# Patient Record
Sex: Male | Born: 1940 | Race: White | Hispanic: No | Marital: Married | Smoking: Former smoker
Health system: Southern US, Community
[De-identification: ages and names within clinical notes are randomized; demographics above are authoritative.]

## PROBLEM LIST (undated history)

## (undated) DIAGNOSIS — F909 Attention-deficit hyperactivity disorder, unspecified type: Secondary | ICD-10-CM

## (undated) DIAGNOSIS — C61 Malignant neoplasm of prostate: Secondary | ICD-10-CM

## (undated) DIAGNOSIS — G4730 Sleep apnea, unspecified: Secondary | ICD-10-CM

## (undated) DIAGNOSIS — F329 Major depressive disorder, single episode, unspecified: Secondary | ICD-10-CM

## (undated) DIAGNOSIS — F32A Depression, unspecified: Secondary | ICD-10-CM

## (undated) HISTORY — DX: Sleep apnea, unspecified: G47.30

## (undated) HISTORY — PX: THROAT SURGERY: SHX803

## (undated) HISTORY — PX: OTHER SURGICAL HISTORY: SHX169

## (undated) HISTORY — DX: Malignant neoplasm of prostate: C61

## (undated) HISTORY — DX: Attention-deficit hyperactivity disorder, unspecified type: F90.9

## (undated) HISTORY — PX: NASAL SEPTUM SURGERY: SHX37

---

## 2011-06-08 ENCOUNTER — Ambulatory Visit: Payer: Self-pay | Admitting: Internal Medicine

## 2011-09-29 ENCOUNTER — Ambulatory Visit: Payer: Self-pay

## 2013-05-23 ENCOUNTER — Ambulatory Visit: Payer: Self-pay | Admitting: Gastroenterology

## 2013-05-26 LAB — PATHOLOGY REPORT

## 2013-12-24 DIAGNOSIS — Z8546 Personal history of malignant neoplasm of prostate: Secondary | ICD-10-CM | POA: Insufficient documentation

## 2013-12-24 DIAGNOSIS — R5383 Other fatigue: Secondary | ICD-10-CM | POA: Insufficient documentation

## 2013-12-24 DIAGNOSIS — R001 Bradycardia, unspecified: Secondary | ICD-10-CM | POA: Insufficient documentation

## 2014-09-13 DIAGNOSIS — F339 Major depressive disorder, recurrent, unspecified: Secondary | ICD-10-CM | POA: Insufficient documentation

## 2014-09-13 DIAGNOSIS — F324 Major depressive disorder, single episode, in partial remission: Secondary | ICD-10-CM | POA: Insufficient documentation

## 2014-09-13 DIAGNOSIS — R030 Elevated blood-pressure reading, without diagnosis of hypertension: Secondary | ICD-10-CM | POA: Insufficient documentation

## 2015-06-01 ENCOUNTER — Emergency Department: Payer: Medicare Other

## 2015-06-01 ENCOUNTER — Emergency Department
Admission: EM | Admit: 2015-06-01 | Discharge: 2015-06-01 | Disposition: A | Discharge status: Home / Self Care | Payer: Medicare Other | Attending: Student | Admitting: Student

## 2015-06-01 ENCOUNTER — Encounter: Payer: Self-pay | Admitting: Emergency Medicine

## 2015-06-01 DIAGNOSIS — M545 Low back pain, unspecified: Secondary | ICD-10-CM

## 2015-06-01 DIAGNOSIS — M549 Dorsalgia, unspecified: Secondary | ICD-10-CM

## 2015-06-01 DIAGNOSIS — Z8659 Personal history of other mental and behavioral disorders: Secondary | ICD-10-CM | POA: Insufficient documentation

## 2015-06-01 DIAGNOSIS — Z8546 Personal history of malignant neoplasm of prostate: Secondary | ICD-10-CM | POA: Diagnosis not present

## 2015-06-01 HISTORY — DX: Depression, unspecified: F32.A

## 2015-06-01 HISTORY — DX: Major depressive disorder, single episode, unspecified: F32.9

## 2015-06-01 LAB — URINALYSIS COMPLETE WITH MICROSCOPIC (ARMC ONLY)
BACTERIA UA: NONE SEEN
BILIRUBIN URINE: NEGATIVE
GLUCOSE, UA: NEGATIVE mg/dL
HGB URINE DIPSTICK: NEGATIVE
Ketones, ur: NEGATIVE mg/dL
Leukocytes, UA: NEGATIVE
Nitrite: NEGATIVE
PH: 5 (ref 5.0–8.0)
Protein, ur: NEGATIVE mg/dL
Specific Gravity, Urine: 1.019 (ref 1.005–1.030)

## 2015-06-01 MED ORDER — TRAMADOL HCL 50 MG PO TABS: 50.0000 mg | ORAL_TABLET | Freq: Four times a day (QID) | ORAL | Status: DC | PRN

## 2015-06-01 MED ORDER — OXYCODONE HCL 5 MG PO TABS
ORAL_TABLET | ORAL | Status: AC
  Administered 2015-06-01: 10 mg via ORAL
  Filled 2015-06-01: qty 2

## 2015-06-01 MED ORDER — ONDANSETRON 4 MG PO TBDP
ORAL_TABLET | ORAL | Status: AC
  Administered 2015-06-01: 4 mg via ORAL
  Filled 2015-06-01: qty 1

## 2015-06-01 MED ORDER — MORPHINE SULFATE (PF) 4 MG/ML IV SOLN
INTRAVENOUS | Status: AC
  Administered 2015-06-01: 4 mg via INTRAMUSCULAR
  Filled 2015-06-01: qty 1

## 2015-06-01 MED ORDER — MORPHINE SULFATE (PF) 4 MG/ML IV SOLN
4.0000 mg | Freq: Once | INTRAVENOUS | Status: AC
Start: 2015-06-01 — End: 2015-06-01
  Administered 2015-06-01: 4 mg via INTRAMUSCULAR

## 2015-06-01 MED ORDER — ONDANSETRON 4 MG PO TBDP
4.0000 mg | ORAL_TABLET | Freq: Once | ORAL | Status: AC
  Administered 2015-06-01: 4 mg via ORAL

## 2015-06-01 MED ORDER — OXYCODONE HCL 5 MG PO TABS
10.0000 mg | ORAL_TABLET | Freq: Once | ORAL | Status: AC
  Administered 2015-06-01: 10 mg via ORAL

## 2015-06-01 NOTE — ED Notes (Signed)
AAOx3.  Skin warm and dry. Moving all extremities equally and strong.  NAD 

## 2015-06-01 NOTE — ED Notes (Signed)
Pt presents with low back pain after bending over today while working with a sander on a deck.

## 2015-06-01 NOTE — ED Provider Notes (Signed)
[facility] Emergency Department Provider Note  ____________________________________________  Time seen: Approximately 4:25 PM  I have reviewed the triage vital signs and the nursing notes.   HISTORY  Chief Complaint Back Pain    HPI [name] is a 74 y.o. male History of depression, no other chronic medical problems, history of treated prostate cancer in 2009 who presents for evaluation of gradual onset constant lumbar back pain today, worse on the right. Patient reports that he was bending over today working with a sander on a deck. He started noticing aching pain in the right back which spread to the remainder of his lower back. It's worse with movement. Current severity of pain is 8 out of 10. No other modifying factors. No chest pain or difficulty breathing, abdominal pain or recent illness. No fevers, chills, history of IV drug use, bowel or bladder incontinence, or weakness. His pain improved with use of an abdominal binder.   Past Medical History  Diagnosis Date  . Depression     There are no active problems to display for this patient.   No past surgical history on file.  Current Outpatient Rx  Name  Route  Sig  Dispense  Refill  . traMADol (ULTRAM) 50 MG tablet   Oral   Take 1 tablet (50 mg total) by mouth every 6 (six) hours as needed for moderate pain.   15 tablet   0     Allergies Review of patient's allergies indicates no known allergies.  No family history on file.  Social History Social History  Substance Use Topics  . Smoking status: Never Smoker   . Smokeless tobacco: None  . Alcohol Use: Yes    Review of Systems Constitutional: No fever/chills Eyes: No visual changes. ENT: No sore throat. Cardiovascular: Denies chest pain. Respiratory: Denies shortness of breath. Gastrointestinal: No abdominal pain.  No nausea, no vomiting.  No diarrhea.  No constipation. Genitourinary: Negative for dysuria. Musculoskeletal:  Positive for back pain. Skin: Negative for rash. Neurological: Negative for headaches, focal weakness or numbness.  10-point ROS otherwise negative.  ____________________________________________   PHYSICAL EXAM:  VITAL SIGNS: ED Triage Vitals  Enc Vitals Group     BP 06/01/15 1550 174/95 mmHg     Pulse Rate 06/01/15 1550 61     Resp 06/01/15 1550 18     Temp 06/01/15 1550 97.5 F (36.4 C)     Temp Source 06/01/15 1550 Oral     SpO2 06/01/15 1550 97 %     Weight 06/01/15 1547 180 lb (81.647 kg)     Height 06/01/15 1547 5\' 9"  (1.753 m)     Head Cir --      Peak Flow --      Pain Score 06/01/15 1550 8     Pain Loc --      Pain Edu? --      Excl. in [place]? --     Constitutional: Alert and oriented. Generally in no acute distress but he does develop pain whenever he sits forward or rotates his body at the torso. Eyes: Conjunctivae are normal. PERRL. EOMI. Head: Atraumatic. Nose: No congestion/rhinnorhea. Mouth/Throat: Mucous membranes are moist.  Oropharynx non-erythematous. Neck: No stridor.   Cardiovascular: Normal rate, regular rhythm. Grossly normal heart sounds.  Good peripheral circulation. Respiratory: Normal respiratory effort.  No retractions. Lungs CTAB. Gastrointestinal: Soft and nontender. No distention. No abdominal bruits. No CVA tenderness. Genitourinary: deferred Musculoskeletal: No lower extremity tenderness nor edema.  No joint  effusions. No midline tenderness to palpation throughout the T or L-spine. Mild point tenderness to palpation in the paravertebral muscles of the right lumbar spine.2+ DP pulses bilaterally. Neurologic:  Normal speech and language. No gross focal neurologic deficits are appreciated.5 out of 5 strength in bilateral lower extremities, sensation intact to light touch throughout, normal strength/dorsiflexion of bilateral big toes. Skin:  Skin is warm, dry and intact. No rash noted. Psychiatric: Mood and affect are normal. Speech and behavior  are normal.  ____________________________________________   LABS (all labs ordered are listed, but only abnormal results are displayed)  Labs Reviewed  URINALYSIS COMPLETEWITH MICROSCOPIC ([place]) - Abnormal; Notable for the following:    Color, Urine YELLOW (*)    APPearance CLEAR (*)    Squamous Epithelial / LPF 0-5 (*)    All other components within normal limits   ____________________________________________  EKG  none ____________________________________________  RADIOLOGY  US aorta  IMPRESSION: No evidence of abdominal aortic aneurysm.   Xray lumbar spine IMPRESSION: 1. 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 No malalignment or fracture. If pain persists despite conservative therapy, MRI may be warranted for further characterization. 2. Small sclerotic lesion in the L2 vertebral body has slightly irregular margins and probably represents a bone island. ____________________________________________   PROCEDURES  Procedure(s) performed: None  Critical Care performed: No  ____________________________________________   INITIAL IMPRESSION / ASSESSMENT AND PLAN / ED COURSE  Pertinent labs & imaging results that were available during my care of the patient were reviewed by me and considered in my medical decision making (see chart for details).  Zachary Hansen is a 74 y.o. male History of depression, no other chronic medical problems, history of treated prostate cancer in 2009 who presents for evaluation of gradual onset constant lumbar back pain today, worse on the right. Intact neurological examination.Suspect muscloskeletal back pain/strain. His pain is improved significantly at this point after 1 dose of intramuscular morphine. Plain films negative for any acute fracture. ultrasoundof the aorta negative for aneurysm. Doubt cauda equina, epidural abscess or ruptured AAA or acute dissection.discussed return precautions  and need for close PCP follow-up. He and family at bedside comfortable with the discharge plan. ____________________________________________   FINAL CLINICAL IMPRESSION(S) / ED DIAGNOSES  Final diagnoses:  Acute lumbar back pain      Joanne Gavel, MD 06/01/15 [phone]

## 2015-06-01 NOTE — ED Notes (Signed)
To x-ray via stretcher.

## 2015-11-18 ENCOUNTER — Ambulatory Visit: Payer: Self-pay | Admitting: Family Medicine

## 2015-12-06 ENCOUNTER — Other Ambulatory Visit: Payer: Self-pay | Admitting: Family Medicine

## 2015-12-06 ENCOUNTER — Ambulatory Visit (INDEPENDENT_AMBULATORY_CARE_PROVIDER_SITE_OTHER): Payer: Medicare Other | Admitting: Family Medicine

## 2015-12-06 ENCOUNTER — Encounter: Payer: Self-pay | Admitting: Family Medicine

## 2015-12-06 VITALS — BP 154/82 | HR 56 | Ht 67.0 in | Wt 184.0 lb

## 2015-12-06 DIAGNOSIS — N200 Calculus of kidney: Secondary | ICD-10-CM

## 2015-12-06 DIAGNOSIS — G4730 Sleep apnea, unspecified: Secondary | ICD-10-CM

## 2015-12-06 DIAGNOSIS — I10 Essential (primary) hypertension: Secondary | ICD-10-CM | POA: Diagnosis not present

## 2015-12-06 DIAGNOSIS — C61 Malignant neoplasm of prostate: Secondary | ICD-10-CM | POA: Diagnosis not present

## 2015-12-06 DIAGNOSIS — R001 Bradycardia, unspecified: Secondary | ICD-10-CM | POA: Diagnosis not present

## 2015-12-06 DIAGNOSIS — R011 Cardiac murmur, unspecified: Secondary | ICD-10-CM | POA: Insufficient documentation

## 2015-12-06 DIAGNOSIS — I129 Hypertensive chronic kidney disease with stage 1 through stage 4 chronic kidney disease, or unspecified chronic kidney disease: Secondary | ICD-10-CM | POA: Insufficient documentation

## 2015-12-06 DIAGNOSIS — M5441 Lumbago with sciatica, right side: Secondary | ICD-10-CM | POA: Diagnosis not present

## 2015-12-06 MED ORDER — NAPROXEN 500 MG PO TABS: 500.0000 mg | ORAL_TABLET | Freq: Two times a day (BID) | ORAL | Status: DC

## 2015-12-06 MED ORDER — CYCLOBENZAPRINE HCL 10 MG PO TABS: 10.0000 mg | ORAL_TABLET | Freq: Every day | ORAL | Status: DC

## 2015-12-06 NOTE — Patient Instructions (Addendum)
Back Exercises The following exercises strengthen the muscles that help to support the back. They also help to keep the lower back flexible. Doing these exercises can help to prevent back pain or lessen existing pain. If you have back pain or discomfort, try doing these exercises 2-3 times each day or as told by your health care provider. When the pain goes away, do them once each day, but increase the number of times that you repeat the steps for each exercise (do more repetitions). If you do not have back pain or discomfort, do these exercises once each day or as told by your health care provider. EXERCISES Single Knee to Chest Repeat these steps 3-5 times for each leg:  Lie on your back on a firm bed or the floor with your legs extended.  Bring one knee to your chest. Your other leg should stay extended and in contact with the floor.  Hold your knee in place by grabbing your knee or thigh.  Pull on your knee until you feel a gentle stretch in your lower back.  Hold the stretch for 10-30 seconds.  Slowly release and straighten your leg. Pelvic Tilt Repeat these steps 5-10 times: 1. Lie on your back on a firm bed or the floor with your legs extended. 2. Bend your knees so they are pointing toward the ceiling and your feet are flat on the floor. 3. Tighten your lower abdominal muscles to press your lower back against the floor. This motion will tilt your pelvis so your tailbone points up toward the ceiling instead of pointing to your feet or the floor. 4. With gentle tension and even breathing, hold this position for 5-10 seconds. Cat-Cow Repeat these steps until your lower back becomes more flexible: 1. Get into a hands-and-knees position on a firm surface. Keep your hands under your shoulders, and keep your knees under your hips. You may place padding under your knees for comfort. 2. Let your head hang down, and point your tailbone toward the floor so your lower back becomes rounded like  the back of a cat. 3. Hold this position for 5 seconds. 4. Slowly lift your head and point your tailbone up toward the ceiling so your back forms a sagging arch like the back of a cow. 5. Hold this position for 5 seconds. Press-Ups Repeat these steps 5-10 times: 1. Lie on your abdomen (face-down) on the floor. 2. Place your palms near your head, about shoulder-width apart. 3. While you keep your back as relaxed as possible and keep your hips on the floor, slowly straighten your arms to raise the top half of your body and lift your shoulders. Do not use your back muscles to raise your upper torso. You may adjust the placement of your hands to make yourself more comfortable. 4. Hold this position for 5 seconds while you keep your back relaxed. 5. Slowly return to lying flat on the floor. Bridges Repeat these steps 10 times: 1. Lie on your back on a firm surface. 2. Bend your knees so they are pointing toward the ceiling and your feet are flat on the floor. 3. Tighten your buttocks muscles and lift your buttocks off of the floor until your waist is at almost the same height as your knees. You should feel the muscles working in your buttocks and the back of your thighs. If you do not feel these muscles, slide your feet 1-2 inches farther away from your buttocks. 4. Hold this position for 3-5  seconds. 5. Slowly lower your hips to the starting position, and allow your buttocks muscles to relax completely. If this exercise is too easy, try doing it with your arms crossed over your chest. Abdominal Crunches Repeat these steps 5-10 times: 1. Lie on your back on a firm bed or the floor with your legs extended. 2. Bend your knees so they are pointing toward the ceiling and your feet are flat on the floor. 3. Cross your arms over your chest. 4. Tip your chin slightly toward your chest without bending your neck. 5. Tighten your abdominal muscles and slowly raise your trunk (torso) high enough to lift  your shoulder blades a tiny bit off of the floor. Avoid raising your torso higher than that, because it can put too much stress on your low back and it does not help to strengthen your abdominal muscles. 6. Slowly return to your starting position. Back Lifts Repeat these steps 5-10 times: 1. Lie on your abdomen (face-down) with your arms at your sides, and rest your forehead on the floor. 2. Tighten the muscles in your legs and your buttocks. 3. Slowly lift your chest off of the floor while you keep your hips pressed to the floor. Keep the back of your head in line with the curve in your back. Your eyes should be looking at the floor. 4. Hold this position for 3-5 seconds. 5. Slowly return to your starting position. SEEK MEDICAL CARE IF:  Your back pain or discomfort gets much worse when you do an exercise.  Your back pain or discomfort does not lessen within 2 hours after you exercise. If you have any of these problems, stop doing these exercises right away. Do not do them again unless your health care provider says that you can. SEEK IMMEDIATE MEDICAL CARE IF:  You develop sudden, severe back pain. If this happens, stop doing the exercises right away. Do not do them again unless your health care provider says that you can.   This information is not intended to replace advice given to you by your health care provider. Make sure you discuss any questions you have with your health care provider.   Document Released: 10/26/2004 Document Revised: 06/09/2015 Document Reviewed: 11/12/2014 Elsevier Interactive Patient Education 2016 [place] DASH stands for "Dietary Approaches to Stop Hypertension." The DASH eating plan is a healthy eating plan that has been shown to reduce high blood pressure (hypertension). Additional health benefits may include reducing the risk of type 2 diabetes mellitus, heart disease, and stroke. The DASH eating plan may also help with weight  loss. WHAT DO I NEED TO KNOW ABOUT THE DASH EATING PLAN? For the DASH eating plan, you will follow these general guidelines:  Choose foods with a percent daily value for sodium of less than 5% (as listed on the food label).  Use salt-free seasonings or herbs instead of table salt or sea salt.  Check with your health care provider or pharmacist before using salt substitutes.  Eat lower-sodium products, often labeled as "lower sodium" or "no salt added."  Eat fresh foods.  Eat more vegetables, fruits, and low-fat dairy products.  Choose whole grains. Look for the word "whole" as the first word in the ingredient list.  Choose fish and skinless chicken or [place] more often than red meat. Limit fish, poultry, and meat to 6 oz (170 g) each day.  Limit sweets, desserts, sugars, and sugary drinks.  Choose heart-healthy fats.  Limit cheese to  1 oz (28 g) per day.  Eat more home-cooked food and less restaurant, buffet, and fast food.  Limit fried foods.  Cook foods using methods other than frying.  Limit canned vegetables. If you do use them, rinse them well to decrease the sodium.  When eating at a restaurant, ask that your food be prepared with less salt, or no salt if possible. WHAT FOODS CAN I EAT? Seek help from a dietitian for individual calorie needs. Grains Whole grain or whole wheat bread. Brown rice. Whole grain or whole wheat pasta. Quinoa, bulgur, and whole grain cereals. Low-sodium cereals. Corn or whole wheat flour tortillas. Whole grain cornbread. Whole grain crackers. Low-sodium crackers. Vegetables Fresh or frozen vegetables (raw, steamed, roasted, or grilled). Low-sodium or reduced-sodium tomato and vegetable juices. Low-sodium or reduced-sodium tomato sauce and paste. Low-sodium or reduced-sodium canned vegetables.  Fruits All fresh, canned (in natural juice), or frozen fruits. Meat and Other Protein Products Ground beef (85% or leaner), grass-fed beef, or beef  trimmed of fat. Skinless chicken or [place]. Ground chicken or [place]. Pork trimmed of fat. All fish and seafood. Eggs. Dried beans, peas, or lentils. Unsalted nuts and seeds. Unsalted canned beans. Dairy Low-fat dairy products, such as skim or 1% milk, 2% or reduced-fat cheeses, low-fat ricotta or cottage cheese, or plain low-fat yogurt. Low-sodium or reduced-sodium cheeses. Fats and Oils Tub margarines without trans fats. Light or reduced-fat mayonnaise and salad dressings (reduced sodium). Avocado. Safflower, olive, or canola oils. Natural peanut or almond butter. Other Unsalted popcorn and pretzels. The items listed above may not be a complete list of recommended foods or beverages. Contact your dietitian for more options. WHAT FOODS ARE NOT RECOMMENDED? Grains White bread. White pasta. White rice. Refined cornbread. Bagels and croissants. Crackers that contain trans fat. Vegetables Creamed or fried vegetables. Vegetables in a cheese sauce. Regular canned vegetables. Regular canned tomato sauce and paste. Regular tomato and vegetable juices. Fruits Dried fruits. Canned fruit in light or heavy syrup. Fruit juice. Meat and Other Protein Products Fatty cuts of meat. Ribs, chicken wings, bacon, sausage, bologna, salami, chitterlings, fatback, hot dogs, bratwurst, and packaged luncheon meats. Salted nuts and seeds. Canned beans with salt. Dairy Whole or 2% milk, cream, half-and-half, and cream cheese. Whole-fat or sweetened yogurt. Full-fat cheeses or blue cheese. Nondairy creamers and whipped toppings. Processed cheese, cheese spreads, or cheese curds. Condiments Onion and garlic salt, seasoned salt, table salt, and sea salt. Canned and packaged gravies. Worcestershire sauce. Tartar sauce. Barbecue sauce. Teriyaki sauce. Soy sauce, including reduced sodium. Steak sauce. Fish sauce. Oyster sauce. Cocktail sauce. Horseradish. Ketchup and mustard. Meat flavorings and tenderizers. Bouillon cubes. Hot  sauce. Tabasco sauce. Marinades. Taco seasonings. Relishes. Fats and Oils Butter, stick margarine, lard, shortening, ghee, and bacon fat. Coconut, palm kernel, or palm oils. Regular salad dressings. Other Pickles and olives. Salted popcorn and pretzels. The items listed above may not be a complete list of foods and beverages to avoid. Contact your dietitian for more information. WHERE CAN I FIND MORE INFORMATION? National Heart, Lung, and Blood Institute: travelstabloid.com   This information is not intended to replace advice given to you by your health care provider. Make sure you discuss any questions you have with your health care provider.   Document Released: 09/07/2011 Document Revised: 10/09/2014 Document Reviewed: 07/23/2013 Elsevier Interactive Patient Education [employer].

## 2015-12-06 NOTE — Progress Notes (Signed)
BP 154/82 mmHg  Pulse 56  Ht 5\' 7"  (1.702 m)  Wt 184 lb (83.462 kg)  BMI 28.81 kg/m2  SpO2 97%   Subjective:    Patient ID: Zachary Hansen, male    DOB: 11/26/1940, 76 y.o.   MRN: [id number]  HPI: Zachary Hansen is a 75 y.o. male who presents today to establish care. Was going to [place]- last saw them about a year ago.  Chief Complaint  Patient presents with  . Back Pain    right side across back and now in left hip  . Hypertension   HYPERTENSION- has never had BP this high before. Has been increasing gradually little by little Hypertension status: uncontrolled  Satisfied with current treatment? 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  Had prostate cancer in July 2009- TURP. Hasn't seen urologist in 2 years.  Had sleep apnea. Uses CPAP, sleeps well with it.   BACK PAIN Duration: Started October or November, hurt when bending down, then got worse with standing up, felt a zing in his back and hasn't felt well since then. Went to the ER and was given tramadol and it got better, but hasn't gone away entirely.  Mechanism of injury: lifting Location: L>R and low back Onset: sudden Severity: severe when it first started, now 2-3/10 Quality: dull Frequency: waxes and wanes Radiation: L leg above the knee Aggravating factors: lifting, movement, walking, bending and prolonged sitting Alleviating factors: TENS unit, rest and heat Status: better Treatments  attempted: TENs unit, rest, heat and ibuprofen  Relief with NSAIDs?: mild Nighttime pain:  no Paresthesias / decreased sensation:  no Bowel / bladder incontinence:  no Fevers:  no Dysuria / urinary frequency:  no  Active Ambulatory Problems    Diagnosis Date Noted  . 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 Fatigue 12/24/2013  . Malignant neoplasm of prostate ([place]) 12/24/2013  . Sleep apnea 12/06/2015  . Heart murmur 12/06/2015  . Kidney stone 12/06/2015  . Essential hypertension 12/06/2015  . Right-sided low back pain with right-sided sciatica 12/06/2015   Resolved Ambulatory Problems    Diagnosis Date Noted  . Blood pressure elevated without history of HTN 09/13/2014   Past Medical History  Diagnosis Date  . Depression   . Prostate cancer ([place])   . ADHD (attention deficit hyperactivity disorder)    Past Surgical History  Procedure Laterality Date  . Prostate cancer surgery    . Throat surgery      Due to snoring  . Nasal septum surgery     No Known Allergies  Family History  Problem Relation Age of Onset  . Arthritis Mother   . Heart disease Father   . Cancer Father   . Heart disease Sister   . Cancer Sister   . Sleep apnea Brother   . Alzheimer's disease Maternal Grandfather   . Heart disease Paternal Grandmother   . Diverticulitis Sister   . Sleep apnea Sister    Social History   Social History  .  Marital Status: Married    Spouse Name: N/A  . Number of Children: N/A  . Years of Education: N/A   Social History Main Topics  . Smoking status: Never Smoker   . Smokeless tobacco: Never Used  . Alcohol Use: 13.8 oz/week    21 Cans of beer, 2 Shots of liquor per week  . Drug Use: No  . Sexual Activity: No   Other Topics Concern  . None   Social History Narrative    Review of Systems  Constitutional: Negative.   Respiratory: Negative.   Cardiovascular: Negative.   Musculoskeletal: Positive for myalgias and back pain.  Negative for joint swelling, arthralgias, gait problem, neck pain and neck stiffness.  Psychiatric/Behavioral: Negative.     Per HPI unless specifically indicated above     Objective:    BP 154/82 mmHg  Pulse 56  Ht 5\' 7"  (1.702 m)  Wt 184 lb (83.462 kg)  BMI 28.81 kg/m2  SpO2 97%  Wt Readings from Last 3 Encounters:  12/06/15 184 lb (83.462 kg)  06/01/15 180 lb (81.647 kg)    Physical Exam  Constitutional: He is oriented to person, place, and time. He appears well-developed and well-nourished. No distress.  HENT:  Head: Normocephalic and atraumatic.  Right Ear: Hearing normal.  Left Ear: Hearing normal.  Nose: Nose normal.  Eyes: Conjunctivae and lids are normal. Right eye exhibits no discharge. Left eye exhibits no discharge. No scleral icterus.  Cardiovascular: Regular rhythm and intact distal pulses.  Bradycardia present.  Exam reveals no gallop and no friction rub.   Murmur heard. Pulmonary/Chest: Effort normal and breath sounds normal. No respiratory distress. He has no wheezes. He has no rales. He exhibits no tenderness.  Neurological: He is alert and oriented to person, place, and time.  Skin: Skin is warm, dry and intact. No rash noted. No erythema. No pallor.  Psychiatric: He has a normal mood and affect. His speech is normal and behavior is normal. Judgment and thought content normal. Cognition and memory are normal.  Nursing note and vitals reviewed. Back Exam:    Inspection:  Normal spinal curvature.  No deformity, ecchymosis, erythema, or lesions     Palpation:     Midline spinal tenderness: no      Paralumbar tenderness: yes Left     Parathoracic tenderness: no      Buttocks tenderness: no     Range of Motion:      Flexion: Fingers to Knees     Extension:Decreased     Lateral bending:Decreased    Rotation:Decreased    Neuro Exam:Lower extremity DTRs normal & symmetric.  Strength and sensation intact.    Special Tests:      Straight leg  raise:negative   Results for orders placed or performed during the hospital encounter of 06/01/15  Urinalysis complete, with microscopic ([facility] only)  Result Value Ref Range   Color, Urine YELLOW (A) YELLOW   APPearance CLEAR (A) CLEAR   Glucose, UA NEGATIVE NEGATIVE mg/dL   Bilirubin Urine NEGATIVE NEGATIVE   Ketones, ur NEGATIVE NEGATIVE mg/dL   Specific Gravity, Urine 1.019 1.005 - 1.030   Hgb urine dipstick NEGATIVE NEGATIVE   pH 5.0 5.0 - 8.0   Protein, ur NEGATIVE NEGATIVE mg/dL   Nitrite NEGATIVE NEGATIVE   Leukocytes, UA NEGATIVE NEGATIVE   RBC / HPF 0-5 0 - 5 RBC/hpf   WBC, UA 0-5 0 - 5 WBC/hpf   Bacteria, UA NONE SEEN NONE SEEN   Squamous Epithelial /  LPF 0-5 (A) NONE SEEN   Mucous PRESENT       Assessment & Plan:   Problem List Items Addressed This Visit      Cardiovascular and Mediastinum   Essential hypertension    Will give him 2 weeks to try DASH diet. If not better, consider lisinopril next visit. Better on recheck today.        Genitourinary   Malignant neoplasm of prostate [facility])    Has not seen Urology in about 2 years, will need referral over there, but will hold off on that for now. Continue to monitor. Will need PSA at his physical.      Relevant Medications   naproxen (NAPROSYN) 500 MG tablet     Other   Bradycardia    Will need follow up with cardiology eventually as it's been 2 years since he's seen them. Hold off right now, consider next visit. No beta-blockers.       Sleep apnea    Continue CPAP. Continue to monitor. Call with any concerns.       Right-sided low back pain with right-sided sciatica - Primary    Will treat with flexeril and naproxen. Exercises given today. Follow up in 2 weeks, if not significantly better, consider PT.       Relevant Medications   cyclobenzaprine (FLEXERIL) 10 MG tablet   naproxen (NAPROSYN) 500 MG tablet       Follow up plan: Return in about 2 weeks (around 12/20/2015) for Follow up back pain and  blood pressure.

## 2015-12-06 NOTE — Assessment & Plan Note (Signed)
Will need follow up with cardiology eventually as it's been 2 years since he's seen them. Hold off right now, consider next visit. No beta-blockers.

## 2015-12-06 NOTE — Assessment & Plan Note (Signed)
Will treat with flexeril and naproxen. Exercises given today. Follow up in 2 weeks, if not significantly better, consider PT.

## 2015-12-06 NOTE — Assessment & Plan Note (Signed)
Will give him 2 weeks to try DASH diet. If not better, consider lisinopril next visit. Better on recheck today.

## 2015-12-06 NOTE — Assessment & Plan Note (Signed)
Has not seen Urology in about 2 years, will need referral over there, but will hold off on that for now. Continue to monitor. Will need PSA at his physical.

## 2015-12-06 NOTE — Assessment & Plan Note (Signed)
Continue CPAP. Continue to monitor. Call with any concerns.

## 2015-12-20 ENCOUNTER — Ambulatory Visit (INDEPENDENT_AMBULATORY_CARE_PROVIDER_SITE_OTHER): Payer: Medicare Other | Admitting: Family Medicine

## 2015-12-20 ENCOUNTER — Encounter: Payer: Self-pay | Admitting: Family Medicine

## 2015-12-20 VITALS — BP 164/84 | HR 80 | Temp 98.9°F | Ht 67.0 in | Wt 190.0 lb

## 2015-12-20 DIAGNOSIS — F329 Major depressive disorder, single episode, unspecified: Secondary | ICD-10-CM

## 2015-12-20 DIAGNOSIS — M5441 Lumbago with sciatica, right side: Secondary | ICD-10-CM | POA: Diagnosis not present

## 2015-12-20 DIAGNOSIS — I10 Essential (primary) hypertension: Secondary | ICD-10-CM

## 2015-12-20 DIAGNOSIS — F32A Depression, unspecified: Secondary | ICD-10-CM

## 2015-12-20 MED ORDER — FLUOXETINE HCL 20 MG PO CAPS: 20.0000 mg | ORAL_CAPSULE | Freq: Every day | ORAL | Status: DC

## 2015-12-20 MED ORDER — NAPROXEN 500 MG PO TABS: 500.0000 mg | ORAL_TABLET | Freq: Two times a day (BID) | ORAL | Status: DC

## 2015-12-20 MED ORDER — CYCLOBENZAPRINE HCL 10 MG PO TABS: 10.0000 mg | ORAL_TABLET | Freq: Every day | ORAL | Status: DC

## 2015-12-20 MED ORDER — LISINOPRIL 10 MG PO TABS: 10.0000 mg | ORAL_TABLET | Freq: Every day | ORAL | Status: DC

## 2015-12-20 NOTE — Progress Notes (Signed)
BP 164/84 mmHg  Pulse 80  Temp(Src) 98.9 F (37.2 C)  Ht 5\' 7"  (1.702 m)  Wt 190 lb (86.183 kg)  BMI 29.75 kg/m2  SpO2 97%  Subjective:    Patient ID: Zachary Hansen, male    DOB: 1941/09/14, 75 y.o.   MRN: [id number]  HPI: Zachary Hansen is a 75 y.o. male  Chief Complaint  Patient presents with  . Depression    Patient needs a refill on Prozac  . Back Pain  . Hypertension   HYPERTENSION Hypertension status: uncontrolled  Satisfied with current treatment? no Duration of hypertension: unknown BP monitoring frequency:  not checking Aspirin: no Recurrent headaches: no Visual changes: no Palpitations: no Dyspnea: no Chest pain: no Lower extremity edema: no Dizzy/lightheaded: no   BACK PAIN- doing much better! Feeling almost like himself, but noticing that he feels numb in his L leg and has been having some atrophy Duration: Started October or November, hurt when bending down, then got worse with standing up, felt a zing in his back and hasn't felt well since then. Went to the ER and was given tramadol and it got better, but hasn't gone away entirely. Much better after new meds from 2 weeks ago Mechanism of injury: lifting Location: L>R and low back Onset: sudden Severity: severe when it first started, now 2-3/10 Quality: dull Frequency: waxes and wanes Radiation: L leg above the knee Aggravating factors: lifting, movement, walking, bending and prolonged sitting Alleviating factors: TENS unit, rest and heat Status: better Treatments attempted: TENs unit, rest, heat and ibuprofen  Relief with NSAIDs?: mild Nighttime pain: no Paresthesias / decreased sensation: no Bowel / bladder incontinence: no Fevers: no  DEPRESSION Mood status: controlled Satisfied with current treatment?: yes Symptom severity: mild  Duration of current treatment : chronic Side effects: no Medication compliance: excellent compliance Psychotherapy/counseling: no  Depressed mood:  yes Anxious mood: no Anhedonia: no Significant weight loss or gain: no Insomnia: no  Fatigue: no Feelings of worthlessness or guilt: no Impaired concentration/indecisiveness: no Suicidal ideations: no Hopelessness: no Crying spells: no Depression screen [facility] 2/9 12/20/2015 12/06/2015  Decreased Interest 1 1  Down, Depressed, Hopeless 1 1  PHQ - 2 Score 2 2  Altered sleeping - 0  Tired, decreased energy - 3  Change in appetite - 0  Feeling bad or failure about yourself  - 0  Trouble concentrating - 0  Moving slowly or fidgety/restless - 0  Suicidal thoughts - 0  PHQ-9 Score - 5  Difficult doing work/chores - Somewhat difficult   Relevant past medical, surgical, family and social history reviewed and updated as indicated. Interim medical history since our last visit reviewed. Allergies and medications reviewed and updated.  Review of Systems  Respiratory: Negative.   Cardiovascular: Negative for chest pain.  Musculoskeletal: Positive for back pain. Negative for myalgias, joint swelling, arthralgias, gait problem, neck pain and neck stiffness.  Neurological: Positive for weakness and numbness. Negative for dizziness, tremors, seizures, syncope, facial asymmetry, speech difficulty, light-headedness and headaches.  Psychiatric/Behavioral: Negative.     Per HPI unless specifically indicated above     Objective:    BP 164/84 mmHg  Pulse 80  Temp(Src) 98.9 F (37.2 C)  Ht 5\' 7"  (1.702 m)  Wt 190 lb (86.183 kg)  BMI 29.75 kg/m2  SpO2 97%  Wt Readings from Last 3 Encounters:  12/20/15 190 lb (86.183 kg)  12/06/15 184 lb (83.462 kg)  06/01/15 180 lb (81.647 kg)    Physical Exam  Constitutional: He is oriented to person, place, and time. He appears well-developed and well-nourished. No distress.  HENT:  Head: Normocephalic and atraumatic.  Right Ear: Hearing normal.  Left Ear: Hearing normal.  Nose: Nose normal.  Eyes: Conjunctivae and lids are normal. Right eye exhibits  no discharge. Left eye exhibits no discharge. No scleral icterus.  Cardiovascular: Normal rate, regular rhythm, normal heart sounds and intact distal pulses.  Exam reveals no gallop and no friction rub.   No murmur heard. Pulmonary/Chest: Effort normal and breath sounds normal. No respiratory distress. He has no wheezes. He has no rales. He exhibits no tenderness.  Musculoskeletal: He exhibits tenderness.  Decreased ROM to lumbar. + tenderness to paraspinal muscles, atrophy L thigh  Neurological: He is alert and oriented to person, place, and time. He has normal reflexes. He displays normal reflexes. No cranial nerve deficit. He exhibits normal muscle tone. Coordination normal.  Decreased sensation L thigh, otherwise in tact  Skin: Skin is warm, dry and intact. No rash noted. No erythema. No pallor.  Psychiatric: He has a normal mood and affect. His speech is normal and behavior is normal. Judgment and thought content normal. Cognition and memory are normal.  Nursing note and vitals reviewed.   Results for orders placed or performed during the hospital encounter of 06/01/15  Urinalysis complete, with microscopic ([facility] only)  Result Value Ref Range   Color, Urine YELLOW (A) YELLOW   APPearance CLEAR (A) CLEAR   Glucose, UA NEGATIVE NEGATIVE mg/dL   Bilirubin Urine NEGATIVE NEGATIVE   Ketones, ur NEGATIVE NEGATIVE mg/dL   Specific Gravity, Urine 1.019 1.005 - 1.030   Hgb urine dipstick NEGATIVE NEGATIVE   pH 5.0 5.0 - 8.0   Protein, ur NEGATIVE NEGATIVE mg/dL   Nitrite NEGATIVE NEGATIVE   Leukocytes, UA NEGATIVE NEGATIVE   RBC / HPF 0-5 0 - 5 RBC/hpf   WBC, UA 0-5 0 - 5 WBC/hpf   Bacteria, UA NONE SEEN NONE SEEN   Squamous Epithelial / LPF 0-5 (A) NONE SEEN   Mucous PRESENT       Assessment & Plan:   Problem List Items Addressed This Visit      Cardiovascular and Mediastinum   Essential hypertension - Primary    Will start on lisinopril. Risks and benefits discussed. Call if  getting dizzy. Recheck 1 month.      Relevant Medications   lisinopril (PRINIVIL,ZESTRIL) 10 MG tablet     Other   Clinical depression    Well controlled on current regimen. Continue current regimen. Continue to monitor. Call with any problems.       Relevant Medications   FLUoxetine (PROZAC) 20 MG capsule   Right-sided low back pain with right-sided sciatica    Improving, but now with parethesias and atrophy- referral to PT made today. Recheck 4 weeks. Continue current meds.       Relevant Medications   naproxen (NAPROSYN) 500 MG tablet   cyclobenzaprine (FLEXERIL) 10 MG tablet       Follow up plan: Return in about 4 weeks (around 01/17/2016) for BP.

## 2015-12-20 NOTE — Assessment & Plan Note (Signed)
Improving, but now with parethesias and atrophy- referral to PT made today. Recheck 4 weeks. Continue current meds.

## 2015-12-20 NOTE — Assessment & Plan Note (Signed)
Well controlled on current regimen. Continue current regimen. Continue to monitor. Call with any problems.

## 2015-12-20 NOTE — Assessment & Plan Note (Signed)
Will start on lisinopril. Risks and benefits discussed. Call if getting dizzy. Recheck 1 month.

## 2015-12-20 NOTE — Patient Instructions (Addendum)
DASH Eating Plan DASH stands for "Dietary Approaches to Stop Hypertension." The DASH eating plan is a healthy eating plan that has been shown to reduce high blood pressure (hypertension). Additional health benefits may include reducing the risk of type 2 diabetes mellitus, heart disease, and stroke. The DASH eating plan may also help with weight loss. WHAT DO I NEED TO KNOW ABOUT THE DASH EATING PLAN? For the DASH eating plan, you will follow these general guidelines:  Choose foods with a percent daily value for sodium of less than 5% (as listed on the food label).  Use salt-free seasonings or herbs instead of table salt or sea salt.  Check with your health care provider or pharmacist before using salt substitutes.  Eat lower-sodium products, often labeled as "lower sodium" or "no salt added."  Eat fresh foods.  Eat more vegetables, fruits, and low-fat dairy products.  Choose whole grains. Look for the word "whole" as the first word in the ingredient list.  Choose fish and skinless chicken or turkey more often than red meat. Limit fish, poultry, and meat to 6 oz (170 g) each day.  Limit sweets, desserts, sugars, and sugary drinks.  Choose heart-healthy fats.  Limit cheese to 1 oz (28 g) per day.  Eat more home-cooked food and less restaurant, buffet, and fast food.  Limit fried foods.  Cook foods using methods other than frying.  Limit canned vegetables. If you do use them, rinse them well to decrease the sodium.  When eating at a restaurant, ask that your food be prepared with less salt, or no salt if possible. WHAT FOODS CAN I EAT? Seek help from a dietitian for individual calorie needs. Grains Whole grain or whole wheat bread. Brown rice. Whole grain or whole wheat pasta. Quinoa, bulgur, and whole grain cereals. Low-sodium cereals. Corn or whole wheat flour tortillas. Whole grain cornbread. Whole grain crackers. Low-sodium crackers. Vegetables Fresh or frozen vegetables  (raw, steamed, roasted, or grilled). Low-sodium or reduced-sodium tomato and vegetable juices. Low-sodium or reduced-sodium tomato sauce and paste. Low-sodium or reduced-sodium canned vegetables.  Fruits All fresh, canned (in natural juice), or frozen fruits. Meat and Other Protein Products Ground beef (85% or leaner), grass-fed beef, or beef trimmed of fat. Skinless chicken or turkey. Ground chicken or turkey. Pork trimmed of fat. All fish and seafood. Eggs. Dried beans, peas, or lentils. Unsalted nuts and seeds. Unsalted canned beans. Dairy Low-fat dairy products, such as skim or 1% milk, 2% or reduced-fat cheeses, low-fat ricotta or cottage cheese, or plain low-fat yogurt. Low-sodium or reduced-sodium cheeses. Fats and Oils Tub margarines without trans fats. Light or reduced-fat mayonnaise and salad dressings (reduced sodium). Avocado. Safflower, olive, or canola oils. Natural peanut or almond butter. Other Unsalted popcorn and pretzels. The items listed above may not be a complete list of recommended foods or beverages. Contact your dietitian for more options. WHAT FOODS ARE NOT RECOMMENDED? Grains White bread. White pasta. White rice. Refined cornbread. Bagels and croissants. Crackers that contain trans fat. Vegetables Creamed or fried vegetables. Vegetables in a cheese sauce. Regular canned vegetables. Regular canned tomato sauce and paste. Regular tomato and vegetable juices. Fruits Dried fruits. Canned fruit in light or heavy syrup. Fruit juice. Meat and Other Protein Products Fatty cuts of meat. Ribs, chicken wings, bacon, sausage, bologna, salami, chitterlings, fatback, hot dogs, bratwurst, and packaged luncheon meats. Salted nuts and seeds. Canned beans with salt. Dairy Whole or 2% milk, cream, half-and-half, and cream cheese. Whole-fat or sweetened yogurt. Full-fat   cheeses or blue cheese. Nondairy creamers and whipped toppings. Processed cheese, cheese spreads, or cheese  curds. Condiments Onion and garlic salt, seasoned salt, table salt, and sea salt. Canned and packaged gravies. Worcestershire sauce. Tartar sauce. Barbecue sauce. Teriyaki sauce. Soy sauce, including reduced sodium. Steak sauce. Fish sauce. Oyster sauce. Cocktail sauce. Horseradish. Ketchup and mustard. Meat flavorings and tenderizers. Bouillon cubes. Hot sauce. Tabasco sauce. Marinades. Taco seasonings. Relishes. Fats and Oils Butter, stick margarine, lard, shortening, ghee, and bacon fat. Coconut, palm kernel, or palm oils. Regular salad dressings. Other Pickles and olives. Salted popcorn and pretzels. The items listed above may not be a complete list of foods and beverages to avoid. Contact your dietitian for more information. WHERE CAN I FIND MORE INFORMATION? National Heart, Lung, and Blood Institute: travelstabloid.com   This information is not intended to replace advice given to you by your health care provider. Make sure you discuss any questions you have with your health care provider.   Document Released: 09/07/2011 Document Revised: 10/09/2014 Document Reviewed: 07/23/2013 Elsevier Interactive Patient Education 2016 Elsevier Inc. Lisinopril tablets What is this medicine? LISINOPRIL (lyse IN oh pril) is an ACE inhibitor. This medicine is used to treat high blood pressure and heart failure. It is also used to protect the heart immediately after a heart attack. This medicine may be used for other purposes; ask your health care provider or pharmacist if you have questions. What should I tell my health care provider before I take this medicine? They need to know if you have any of these conditions: -diabetes -heart or blood vessel disease -kidney disease -low blood pressure -previous swelling of the tongue, face, or lips with difficulty breathing, difficulty swallowing, hoarseness, or tightening of the throat -an unusual or allergic reaction to  lisinopril, other ACE inhibitors, insect venom, foods, dyes, or preservatives -pregnant or trying to get pregnant -breast-feeding How should I use this medicine? Take this medicine by mouth with a glass of water. Follow the directions on your prescription label. You may take this medicine with or without food. If it upsets your stomach, take it with food. Take your medicine at regular intervals. Do not take it more often than directed. Do not stop taking except on your doctor's advice. Talk to your pediatrician regarding the use of this medicine in children. Special care may be needed. While this drug may be prescribed for children as young as 51 years of age for selected conditions, precautions do apply. Overdosage: If you think you have taken too much of this medicine contact a poison control center or emergency room at once. NOTE: This medicine is only for you. Do not share this medicine with others. What if I miss a dose? If you miss a dose, take it as soon as you can. If it is almost time for your next dose, take only that dose. Do not take double or extra doses. What may interact with this medicine? Do not take this medicine with any of the following medications: -hymenoptera venomThis medicines may also interact with the following medications: -aliskiren -angiotensin receptor blockers, like losartan or valsartan -certain medicines for diabetes -diuretics -everolimus -gold compounds -lithium -NSAIDs, medicines for pain and inflammation, like ibuprofen or naproxen -potassium salts or supplements -salt substitutes -sirolimus -temsirolimus This list may not describe all possible interactions. Give your health care provider a list of all the medicines, herbs, non-prescription drugs, or dietary supplements you use. Also tell them if you smoke, drink alcohol, or use illegal drugs. Some  items may interact with your medicine. What should I watch for while using this medicine? Visit your  doctor or health care professional for regular check ups. Check your blood pressure as directed. Ask your doctor what your blood pressure should be, and when you should contact him or her. Do not treat yourself for coughs, colds, or pain while you are using this medicine without asking your doctor or health care professional for advice. Some ingredients may increase your blood pressure. Women should inform their doctor if they wish to become pregnant or think they might be pregnant. There is a potential for serious side effects to an unborn child. Talk to your health care professional or pharmacist for more information. Check with your doctor or health care professional if you get an attack of severe diarrhea, nausea and vomiting, or if you sweat a lot. The loss of too much body fluid can make it dangerous for you to take this medicine. You may get drowsy or dizzy. Do not drive, use machinery, or do anything that needs mental alertness until you know how this drug affects you. Do not stand or sit up quickly, especially if you are an older patient. This reduces the risk of dizzy or fainting spells. Alcohol can make you more drowsy and dizzy. Avoid alcoholic drinks. Avoid salt substitutes unless you are told otherwise by your doctor or health care professional. What side effects may I notice from receiving this medicine? Side effects that you should report to your doctor or health care professional as soon as possible: -allergic reactions like skin rash, itching or hives, swelling of the hands, feet, face, lips, throat, or tongue -breathing problems -signs and symptoms of kidney injury like trouble passing urine or change in the amount of urine -signs and symptoms of increased potassium like muscle weakness; chest pain; or fast, irregular heartbeat -signs and symptoms of liver injury like dark yellow or brown urine; general ill feeling or flu-like symptoms; light-colored stools; loss of appetite; nausea;  right upper belly pain; unusually weak or tired; yellowing of the eyes or skin -signs and symptoms of low blood pressure like dizziness; feeling faint or lightheaded, falls; unusually weak or tired -stomach pain with or without nausea and vomiting Side effects that usually do not require medical attention (report to your doctor or health care professional if they continue or are bothersome): -changes in taste -cough -dizziness -fever -headache -sensitivity to light This list may not describe all possible side effects. Call your doctor for medical advice about side effects. You may report side effects to FDA at 1-800-FDA-1088. Where should I keep my medicine? Keep out of the reach of children. Store at room temperature between 15 and 30 degrees C (59 and 86 degrees F). Protect from moisture. Keep container tightly closed. Throw away any unused medicine after the expiration date. NOTE: This sheet is a summary. It may not cover all possible information. If you have questions about this medicine, talk to your doctor, pharmacist, or health care provider.    2016, Elsevier/Gold Standard. (2015-05-13 20:38:20)  [place], [place] [address]  CONTACT INFORMATION   Phone:[phone]   Fax: [phone]   [facility] [address] [place], [place] [address] Phone:  [phone] [facility] [address] [place], [place] [address] Phone:  [phone] [facility] [address] [place], [place] [address]  Phone:  [phone] [facility] [address], [address] [place], [place] [address] Phone:  [phone] [facility] [address] [place], [place] [address] Phone:  [phone] [facility] [address] [place], [place] [address] Phone:  [phone] [facility] [address] [place], [place] [address] Phone:  [phone] [facility] [address] [place], [place] [address] Phone:  [phone] [facility] [address] [place], [place] [address] Phone:  [phone] [facility] [address] [place], [place] [address] Phone:  [phone] View Map

## 2016-01-07 DIAGNOSIS — M5441 Lumbago with sciatica, right side: Secondary | ICD-10-CM | POA: Diagnosis not present

## 2016-01-10 DIAGNOSIS — M5441 Lumbago with sciatica, right side: Secondary | ICD-10-CM | POA: Diagnosis not present

## 2016-01-21 ENCOUNTER — Ambulatory Visit (INDEPENDENT_AMBULATORY_CARE_PROVIDER_SITE_OTHER): Payer: Medicare Other | Admitting: Family Medicine

## 2016-01-21 ENCOUNTER — Encounter: Payer: Self-pay | Admitting: Family Medicine

## 2016-01-21 VITALS — BP 126/64 | HR 56 | Temp 98.0°F | Ht 68.3 in | Wt 184.0 lb

## 2016-01-21 DIAGNOSIS — I10 Essential (primary) hypertension: Secondary | ICD-10-CM | POA: Diagnosis not present

## 2016-01-21 MED ORDER — LISINOPRIL 20 MG PO TABS: 20.0000 mg | ORAL_TABLET | Freq: Every day | ORAL | Status: DC

## 2016-01-21 NOTE — Progress Notes (Signed)
BP 126/64 mmHg  Pulse 56  Temp(Src) 98 F (36.7 C)  Ht 5' 8.3" (1.735 m)  Wt 184 lb (83.462 kg)  BMI 27.73 kg/m2  SpO2 95%   Subjective:    Patient ID: Zachary Hansen, male    DOB: June 21, 1941, 75 y.o.   MRN: [id number]  HPI: Zachary Hansen is a 75 y.o. male  Chief Complaint  Patient presents with  . Hypertension    Lisinopril dose was increased at [place]  . Patient is getting records from [place] Hypertension status: controlled  Satisfied with current treatment? yes Duration of hypertension: diagnosed about 1 month ago BP monitoring frequency:  not checking BP medication side effects:  yes- mild cough Medication compliance: excellent compliance Aspirin: yes Recurrent headaches: no Visual changes: no Palpitations: no Dyspnea: no Chest pain: no Lower extremity edema: no Dizzy/lightheaded: yes with getting up too fast  Relevant past medical, surgical, family and social history reviewed and updated as indicated. Interim medical history since our last visit reviewed. Allergies and medications reviewed and updated.  Review of Systems  Constitutional: Negative.   Respiratory: Negative.   Cardiovascular: Negative.   Psychiatric/Behavioral: Negative.     Per HPI unless specifically indicated above     Objective:    BP 126/64 mmHg  Pulse 56  Temp(Src) 98 F (36.7 C)  Ht 5' 8.3" (1.735 m)  Wt 184 lb (83.462 kg)  BMI 27.73 kg/m2  SpO2 95%  Wt Readings from Last 3 Encounters:  01/21/16 184 lb (83.462 kg)  12/20/15 190 lb (86.183 kg)  12/06/15 184 lb (83.462 kg)    Physical Exam  Constitutional: He is oriented to person, place, and time. He appears well-developed and well-nourished. No distress.  HENT:  Head: Normocephalic and atraumatic.  Right Ear: Hearing normal.  Left Ear: Hearing normal.  Nose: Nose normal.  Eyes: Conjunctivae and lids are normal. Right eye exhibits no discharge. Left eye exhibits no discharge. No scleral icterus.   Cardiovascular: Normal rate, regular rhythm, normal heart sounds and intact distal pulses.  Exam reveals no gallop and no friction rub.   No murmur heard. Pulmonary/Chest: Effort normal and breath sounds normal. No respiratory distress. He has no wheezes. He has no rales. He exhibits no tenderness.  Musculoskeletal: Normal range of motion.  Neurological: He is alert and oriented to person, place, and time.  Skin: Skin is warm, dry and intact. No rash noted. No erythema. No pallor.  Psychiatric: He has a normal mood and affect. His speech is normal and behavior is normal. Judgment and thought content normal. Cognition and memory are normal.  Nursing note and vitals reviewed.   Results for orders placed or performed during the hospital encounter of 06/01/15  Urinalysis complete, with microscopic ([facility] only)  Result Value Ref Range   Color, Urine YELLOW (A) YELLOW   APPearance CLEAR (A) CLEAR   Glucose, UA NEGATIVE NEGATIVE mg/dL   Bilirubin Urine NEGATIVE NEGATIVE   Ketones, ur NEGATIVE NEGATIVE mg/dL   Specific Gravity, Urine 1.019 1.005 - 1.030   Hgb urine dipstick NEGATIVE NEGATIVE   pH 5.0 5.0 - 8.0   Protein, ur NEGATIVE NEGATIVE mg/dL   Nitrite NEGATIVE NEGATIVE   Leukocytes, UA NEGATIVE NEGATIVE   RBC / HPF 0-5 0 - 5 RBC/hpf   WBC, UA 0-5 0 - 5 WBC/hpf   Bacteria, UA NONE SEEN NONE SEEN   Squamous Epithelial / LPF 0-5 (A) NONE SEEN   Mucous PRESENT  Assessment & Plan:   Problem List Items Addressed This Visit      Cardiovascular and Mediastinum   Essential hypertension - Primary    Under good control. Continue current regimen. Continue to monitor. BMP checked today. Return in 6 months.       Relevant Medications   lisinopril (PRINIVIL,ZESTRIL) 20 MG tablet   Other Relevant Orders   Basic metabolic panel       Follow up plan: Return in about 6 months (around 07/22/2016).

## 2016-01-21 NOTE — Assessment & Plan Note (Signed)
Under good control. Continue current regimen. Continue to monitor. BMP checked today. Return in 6 months.

## 2016-01-22 LAB — BASIC METABOLIC PANEL
BUN/Creatinine Ratio: 21 (ref 10–24)
BUN: 24 mg/dL (ref 8–27)
CALCIUM: 9.7 mg/dL (ref 8.6–10.2)
CO2: 23 mmol/L (ref 18–29)
Chloride: 102 mmol/L (ref 96–106)
Creatinine, Ser: 1.12 mg/dL (ref 0.76–1.27)
GFR calc Af Amer: 75 mL/min/{1.73_m2} (ref >59)
GFR, EST NON AFRICAN AMERICAN: 65 mL/min/{1.73_m2} (ref >59)
GLUCOSE: 110 mg/dL — AB (ref 65–99)
POTASSIUM: 4.6 mmol/L (ref 3.5–5.2)
SODIUM: 141 mmol/L (ref 134–144)

## 2016-01-24 ENCOUNTER — Encounter: Payer: Self-pay | Admitting: Family Medicine

## 2016-01-24 ENCOUNTER — Other Ambulatory Visit: Payer: Medicare Other

## 2016-01-24 ENCOUNTER — Telehealth: Payer: Self-pay

## 2016-01-24 DIAGNOSIS — R739 Hyperglycemia, unspecified: Secondary | ICD-10-CM

## 2016-01-24 LAB — BAYER DCA HB A1C WAIVED: HB A1C (BAYER DCA - WAIVED): 5.7 % (ref <7.0)

## 2016-01-24 NOTE — Telephone Encounter (Signed)
Called and left a message for patient to return my call, Dr.Johnson would like patient to come in for labs only. Labs are in.

## 2016-01-27 NOTE — Telephone Encounter (Signed)
Called patient again, no answer, left message for patient to come in for labs.

## 2016-02-03 NOTE — Telephone Encounter (Signed)
He already got that. Don't worry about it.

## 2016-02-03 NOTE — Telephone Encounter (Signed)
Called again, no answer, left message for patient letting him know that Dr.Johnson wanted more labs.  Dr.Johnson, I have tried this patient 3 times, what would you like me to do now?

## 2016-02-07 ENCOUNTER — Ambulatory Visit: Payer: Medicare Other | Admitting: Unknown Physician Specialty

## 2016-02-07 ENCOUNTER — Encounter: Payer: Self-pay | Admitting: Family Medicine

## 2016-02-07 ENCOUNTER — Ambulatory Visit (INDEPENDENT_AMBULATORY_CARE_PROVIDER_SITE_OTHER): Payer: Medicare Other | Admitting: Family Medicine

## 2016-02-07 VITALS — BP 151/93 | HR 86 | Temp 99.2°F | Wt 181.0 lb

## 2016-02-07 DIAGNOSIS — K5732 Diverticulitis of large intestine without perforation or abscess without bleeding: Secondary | ICD-10-CM | POA: Diagnosis not present

## 2016-02-07 DIAGNOSIS — R109 Unspecified abdominal pain: Secondary | ICD-10-CM

## 2016-02-07 LAB — MICROSCOPIC EXAMINATION

## 2016-02-07 LAB — UA/M W/RFLX CULTURE, ROUTINE
BILIRUBIN UA: NEGATIVE
GLUCOSE, UA: NEGATIVE
KETONES UA: NEGATIVE
Leukocytes, UA: NEGATIVE
NITRITE UA: NEGATIVE
RBC UA: NEGATIVE
SPEC GRAV UA: 1.015 (ref 1.005–1.030)
UUROB: 1 mg/dL (ref 0.2–1.0)
pH, UA: 6.5 (ref 5.0–7.5)

## 2016-02-07 MED ORDER — METRONIDAZOLE 500 MG PO TABS: 500.0000 mg | ORAL_TABLET | Freq: Two times a day (BID) | ORAL | Status: DC

## 2016-02-07 MED ORDER — CIPROFLOXACIN HCL 500 MG PO TABS: 500.0000 mg | ORAL_TABLET | Freq: Two times a day (BID) | ORAL | Status: DC

## 2016-02-07 NOTE — Progress Notes (Signed)
BP 151/93 mmHg  Pulse 86  Temp(Src) 99.2 F (37.3 C)  Wt 181 lb (82.101 kg)  SpO2 98%   Subjective:    Patient ID: Zachary Hansen, male    DOB: 11/16/1940, 75 y.o.   MRN: [id number]  HPI: Zachary Hansen is a 75 y.o. male  Chief Complaint  Patient presents with  . Abdominal Pain    left side since Saturday. Was much worse Saturday. 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 Was also constipated Saturday.   ABDOMINAL PAIN  Duration: about 2 days Onset: sudden Severity: severe Quality: dull and tender Location:  LLQ  Episode duration: constant Radiation: no Frequency: constant Alleviating factors: BM- a little bit  Aggravating factors: nothing Status: better Treatments attempted: prune juice Fever: no Nausea: no Vomiting: no Weight loss: no Decreased appetite: yes Diarrhea: yes Constipation: yes Blood in stool: no Heartburn: no Jaundice: no Rash: no Dysuria/urinary frequency: no Hematuria: no History of sexually transmitted disease: no Recurrent NSAID use: no  Relevant past medical, surgical, family and social history reviewed and updated as indicated. Interim medical history since our last visit reviewed. Allergies and medications reviewed and updated.  Review of Systems  Constitutional: Negative.   Respiratory: Negative.   Cardiovascular: Negative.   Gastrointestinal: Positive for abdominal pain, diarrhea and constipation. Negative for nausea, vomiting, blood in stool, abdominal distention, anal bleeding and rectal pain.  Psychiatric/Behavioral: Negative.     Per HPI unless specifically indicated above     Objective:    BP 151/93 mmHg  Pulse 86  Temp(Src) 99.2 F (37.3 C)  Wt 181 lb (82.101 kg)  SpO2 98%  Wt Readings from Last 3 Encounters:  02/07/16 181 lb (82.101 kg)  01/21/16 184 lb (83.462 kg)  12/20/15 190 lb (86.183 kg)    Physical Exam  Constitutional: He is oriented to person, place, and time. He appears  well-developed and well-nourished. No distress.  HENT:  Head: Normocephalic and atraumatic.  Right Ear: Hearing normal.  Left Ear: Hearing normal.  Nose: Nose normal.  Eyes: Conjunctivae and lids are normal. Right eye exhibits no discharge. Left eye exhibits no discharge. No scleral icterus.  Cardiovascular: Normal rate, regular rhythm, normal heart sounds and intact distal pulses.  Exam reveals no gallop and no friction rub.   No murmur heard. Pulmonary/Chest: Effort normal and breath sounds normal. No respiratory distress. He has no wheezes. He has no rales. He exhibits no tenderness.  Abdominal: Bowel sounds are normal. He exhibits no distension and no mass. There is tenderness in the left lower quadrant. There is no rebound and no guarding.  Musculoskeletal: Normal range of motion.  Neurological: He is alert and oriented to person, place, and time.  Skin: Skin is warm, dry and intact. No rash noted. No erythema. No pallor.  Psychiatric: He has a normal mood and affect. His speech is normal and behavior is normal. Judgment and thought content normal. Cognition and memory are normal.  Nursing note and vitals reviewed.   Results for orders placed or performed in visit on 02/07/16  Microscopic Examination  Result Value Ref Range   WBC, UA 0-5 0 -  5 /hpf   RBC, UA 0-2 0 -  2 /hpf   Epithelial Cells (non renal) 0-10 0 - 10 /hpf   Bacteria, UA Few None seen/Few  UA/M w/rflx Culture, Routine (STAT)  Result Value Ref Range   Specific Gravity, UA 1.015 1.005 - 1.030   pH, UA 6.5  5.0 - 7.5   Color, UA Yellow Yellow   Appearance Ur Clear Clear   Leukocytes, UA Negative Negative   Protein, UA 1+ (A) Negative/Trace   Glucose, UA Negative Negative   Ketones, UA Negative Negative   RBC, UA Negative Negative   Bilirubin, UA Negative Negative   Urobilinogen, Ur 1.0 0.2 - 1.0 mg/dL   Nitrite, UA Negative Negative   Microscopic Examination See below:       Assessment & Plan:   Problem  List Items Addressed This Visit    None    Visit Diagnoses    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 Will start cipro and flagyl, if not better in 2-3 days, return and will get CT scan.     Relevant Medications    metroNIDAZOLE (FLAGYL) 500 MG tablet    ciprofloxacin (CIPRO) 500 MG tablet    Abdominal pain, unspecified abdominal location        Seems to be due to diveriticulitis. Will treat and montior closely.    Relevant Orders    UA/M w/rflx Culture, Routine (STAT) (Completed)        Follow up plan: Return As scheduled.

## 2016-02-07 NOTE — Patient Instructions (Signed)
Diverticulitis °Diverticulitis is inflammation or infection of small pouches in your colon that form when you have a condition called diverticulosis. The pouches in your colon are called diverticula. Your colon, or large intestine, is where water is absorbed and stool is formed. °Complications of diverticulitis can include: °· Bleeding. °· Severe infection. °· Severe pain. °· Perforation of your colon. °· Obstruction of your colon. °CAUSES  °Diverticulitis is caused by bacteria. °Diverticulitis happens when stool becomes trapped in diverticula. This allows bacteria to grow in the diverticula, which can lead to inflammation and infection. °RISK FACTORS °People with diverticulosis are at risk for diverticulitis. Eating a diet that does not include enough fiber from fruits and vegetables may make diverticulitis more likely to develop. °SYMPTOMS  °Symptoms of diverticulitis may include: °· Abdominal pain and tenderness. The pain is normally located on the left side of the abdomen, but may occur in other areas. °· Fever and chills. °· Bloating. °· Cramping. °· Nausea. °· Vomiting. °· Constipation. °· Diarrhea. °· Blood in your stool. °DIAGNOSIS  °Your health care provider will ask you about your medical history and do a physical exam. You may need to have tests done because many medical conditions can cause the same symptoms as diverticulitis. Tests may include: °· Blood tests. °· Urine tests. °· Imaging tests of the abdomen, including X-rays and CT scans. °When your condition is under control, your health care provider may recommend that you have a colonoscopy. A colonoscopy can show how severe your diverticula are and whether something else is causing your symptoms. °TREATMENT  °Most cases of diverticulitis are mild and can be treated at home. Treatment may include: °· Taking over-the-counter pain medicines. °· Following a clear liquid diet. °· Taking antibiotic medicines by mouth for 7-10 days. °More severe cases may  be treated at a hospital. Treatment may include: °· Not eating or drinking. °· Taking prescription pain medicine. °· Receiving antibiotic medicines through an IV tube. °· Receiving fluids and nutrition through an IV tube. °· Surgery. °HOME CARE INSTRUCTIONS  °· Follow your health care provider's instructions carefully. °· Follow a full liquid diet or other diet as directed by your health care provider. After your symptoms improve, your health care provider may tell you to change your diet. He or she may recommend you eat a high-fiber diet. Fruits and vegetables are good sources of fiber. Fiber makes it easier to pass stool. °· Take fiber supplements or probiotics as directed by your health care provider. °· Only take medicines as directed by your health care provider. °· Keep all your follow-up appointments. °SEEK MEDICAL CARE IF:  °· Your pain does not improve. °· You have a hard time eating food. °· Your bowel movements do not return to normal. °SEEK IMMEDIATE MEDICAL CARE IF:  °· Your pain becomes worse. °· Your symptoms do not get better. °· Your symptoms suddenly get worse. °· You have a fever. °· You have repeated vomiting. °· You have bloody or black, tarry stools. °MAKE SURE YOU:  °· Understand these instructions. °· Will watch your condition. °· Will get help right away if you are not doing well or get worse. °  °This information is not intended to replace advice given to you by your health care provider. Make sure you discuss any questions you have with your health care provider. °  °Document Released: 06/28/2005 Document Revised: 09/23/2013 Document Reviewed: 08/13/2013 °Elsevier Interactive Patient Education ©2016 [employer] °Low-Fiber Diet °Fiber is found in fruits, vegetables, and   whole grains. A low-fiber diet restricts fibrous foods that are not digested in the small intestine. A diet containing about 10-15 grams of fiber per day is considered low fiber. Low-fiber diets may be used to:  Promote  healing and rest the bowel during intestinal flare-ups.  Prevent blockage of a partially obstructed or narrowed gastrointestinal tract.  Reduce fecal weight and volume.  Slow the movement of feces. You may be on a low-fiber diet as a transitional diet following surgery, after an injury (trauma), or because of a short (acute) or lifelong (chronic) illness. Your health care provider will determine the length of time you need to stay on this diet.  WHAT DO I NEED TO KNOW ABOUT A LOW-FIBER DIET? Always check the fiber content on the packaging's Nutrition Facts label, especially on foods from the grains list. Ask your dietitian if you have questions about specific foods that are related to your condition, especially if the food is not listed below. In general, a low-fiber food will have less than 2 g of fiber. WHAT FOODS CAN I EAT? Grains All breads and crackers made with white flour. Sweet rolls, doughnuts, waffles, pancakes, [place] toast, bagels. Pretzels, Melba toast, zwieback. Well-cooked cereals, such as cornmeal, farina, or cream cereals. Dry cereals that do not contain whole grains, fruit, or nuts, such as refined corn, wheat, rice, and oat cereals. Potatoes prepared any way without skins, plain pastas and noodles, refined white rice. Use white flour for baking and making sauces. Use allowed list of grains for casseroles, dumplings, and puddings.  Vegetables Strained tomato and vegetable juices. Fresh lettuce, cucumber, spinach. Well-cooked (no skin or pulp) or canned vegetables, such as asparagus, bean sprouts, beets, carrots, green beans, mushrooms, potatoes, pumpkin, spinach, yellow squash, tomato sauce/puree, turnips, yams, and zucchini. Keep servings limited to  cup.  Fruits All fruit juices except prune juice. Cooked or canned fruits without skin and seeds, such as applesauce, apricots, cherries, fruit cocktail, grapefruit, grapes, mandarin oranges, melons, peaches, pears, pineapple, and  plums. Fresh fruits without skin, such as apricots, avocados, bananas, melons, pineapple, nectarines, and peaches. Keep servings limited to  cup or 1 piece.  Meat and Other Protein Sources Ground or well-cooked tender beef, ham, veal, lamb, pork, or poultry. Eggs, plain cheese. Fish, oysters, shrimp, lobster, and other seafood. Liver, organ meats. Smooth nut butters. Dairy All milk products and alternative dairy substitutes, such as soy, rice, almond, and coconut, not containing added whole nuts, seeds, or added fruit. Beverages Decaf coffee, fruit, and vegetable juices or smoothies (small amounts, with no pulp or skins, and with fruits from allowed list), sports drinks, herbal tea. Condiments Ketchup, mustard, vinegar, cream sauce, cheese sauce, cocoa powder. Spices in moderation, such as allspice, basil, bay leaves, celery powder or leaves, cinnamon, cumin powder, curry powder, ginger, mace, marjoram, onion or garlic powder, oregano, paprika, parsley flakes, ground pepper, rosemary, sage, savory, tarragon, thyme, and turmeric. Sweets and Desserts Plain cakes and cookies, pie made with allowed fruit, pudding, custard, cream pie. Gelatin, fruit, ice, sherbet, frozen ice pops. Ice cream, ice milk without nuts. Plain hard candy, honey, jelly, molasses, syrup, sugar, chocolate syrup, gumdrops, marshmallows. Limit overall sugar intake.  Fats and Oil Margarine, butter, cream, mayonnaise, salad oils, plain salad dressings made from allowed foods. Choose healthy fats such as olive oil, canola oil, and omega-3 fatty acids (such as found in salmon or tuna) when possible.  Other Bouillon, broth, or cream soups made from allowed foods. Any strained soup. Casseroles or  mixed dishes made with allowed foods. The items listed above may not be a complete list of recommended foods or beverages. Contact your dietitian for more options.  WHAT FOODS ARE NOT RECOMMENDED? Grains All whole wheat and whole grain breads  and crackers. Multigrains, rye, bran seeds, nuts, or coconut. Cereals containing whole grains, multigrains, bran, coconut, nuts, raisins. Cooked or dry oatmeal, steel-cut oats. Coarse wheat cereals, granola. Cereals advertised as high fiber. Potato skins. Whole grain pasta, wild or brown rice. Popcorn. Coconut flour. Bran, buckwheat, corn bread, multigrains, rye, wheat germ.  Vegetables Fresh, cooked or canned vegetables, such as artichokes, asparagus, beet greens, broccoli, Brussels sprouts, cabbage, celery, cauliflower, corn, eggplant, kale, legumes or beans, okra, peas, and tomatoes. Avoid large servings of any vegetables, especially raw vegetables.  Fruits Fresh fruits, such as apples with or without skin, berries, cherries, figs, grapes, grapefruit, guavas, kiwis, mangoes, oranges, papayas, pears, persimmons, pineapple, and pomegranate. Prune juice and juices with pulp, stewed or dried prunes. Dried fruits, dates, raisins. Fruit seeds or skins. Avoid large servings of all fresh fruits. Meats and Other Protein Sources Tough, fibrous meats with gristle. Chunky nut butter. Cheese made with seeds, nuts, or other foods not recommended. Nuts, seeds, legumes (beans, including baked beans), dried peas, beans, lentils.  Dairy Yogurt or cheese that contains nuts, seeds, or added fruit.  Beverages Fruit juices with high pulp, prune juice. Caffeinated coffee and teas.  Condiments Coconut, maple syrup, pickles, olives. Sweets and Desserts Desserts, cookies, or candies that contain nuts or coconut, chunky peanut butter, dried fruits. Jams, preserves with seeds, marmalade. Large amounts of sugar and sweets. Any other dessert made with fruits from the not recommended list.  Other Soups made from vegetables that are not recommended or that contain other foods not recommended.  The items listed above may not be a complete list of foods and beverages to avoid. Contact your dietitian for more information.   This  information is not intended to replace advice given to you by your health care provider. Make sure you discuss any questions you have with your health care provider.   Document Released: 03/10/2002 Document Revised: 09/23/2013 Document Reviewed: 08/11/2013 Elsevier Interactive Patient Education [employer].

## 2016-03-13 DIAGNOSIS — H16142 Punctate keratitis, left eye: Secondary | ICD-10-CM | POA: Diagnosis not present

## 2016-06-06 ENCOUNTER — Other Ambulatory Visit (HOSPITAL_COMMUNITY): Payer: Self-pay | Admitting: Interventional Radiology

## 2016-06-06 DIAGNOSIS — I82402 Acute embolism and thrombosis of unspecified deep veins of left lower extremity: Secondary | ICD-10-CM

## 2016-06-24 ENCOUNTER — Other Ambulatory Visit: Payer: Self-pay | Admitting: Family Medicine

## 2016-06-27 ENCOUNTER — Inpatient Hospital Stay: Admission: RE | Admit: 2016-06-27 | Payer: Medicare Other | Source: Ambulatory Visit

## 2016-06-27 ENCOUNTER — Other Ambulatory Visit: Payer: Medicare Other

## 2016-07-24 ENCOUNTER — Encounter: Payer: Self-pay | Admitting: Family Medicine

## 2016-07-24 ENCOUNTER — Ambulatory Visit (INDEPENDENT_AMBULATORY_CARE_PROVIDER_SITE_OTHER): Payer: Medicare Other | Admitting: Family Medicine

## 2016-07-24 VITALS — BP 118/64 | HR 75 | Temp 98.1°F | Wt 183.4 lb

## 2016-07-24 DIAGNOSIS — R001 Bradycardia, unspecified: Secondary | ICD-10-CM | POA: Diagnosis not present

## 2016-07-24 DIAGNOSIS — I10 Essential (primary) hypertension: Secondary | ICD-10-CM | POA: Diagnosis not present

## 2016-07-24 DIAGNOSIS — C61 Malignant neoplasm of prostate: Secondary | ICD-10-CM

## 2016-07-24 DIAGNOSIS — M5441 Lumbago with sciatica, right side: Secondary | ICD-10-CM

## 2016-07-24 DIAGNOSIS — F3341 Major depressive disorder, recurrent, in partial remission: Secondary | ICD-10-CM

## 2016-07-24 DIAGNOSIS — N200 Calculus of kidney: Secondary | ICD-10-CM

## 2016-07-24 DIAGNOSIS — R5382 Chronic fatigue, unspecified: Secondary | ICD-10-CM

## 2016-07-24 DIAGNOSIS — Z13220 Encounter for screening for lipoid disorders: Secondary | ICD-10-CM

## 2016-07-24 DIAGNOSIS — G4733 Obstructive sleep apnea (adult) (pediatric): Secondary | ICD-10-CM | POA: Diagnosis not present

## 2016-07-24 DIAGNOSIS — G8929 Other chronic pain: Secondary | ICD-10-CM | POA: Diagnosis not present

## 2016-07-24 LAB — UA/M W/RFLX CULTURE, ROUTINE
Bilirubin, UA: NEGATIVE
GLUCOSE, UA: NEGATIVE
Leukocytes, UA: NEGATIVE
NITRITE UA: NEGATIVE
PH UA: 5 (ref 5.0–7.5)
RBC, UA: NEGATIVE
Specific Gravity, UA: >1.03 — ABNORMAL HIGH (ref 1.005–1.030)
Urobilinogen, Ur: 0.2 mg/dL (ref 0.2–1.0)

## 2016-07-24 LAB — MICROSCOPIC EXAMINATION
BACTERIA UA: NONE SEEN
RBC MICROSCOPIC, UA: NONE SEEN /HPF (ref 0–?)
WBC, UA: NONE SEEN /hpf (ref 0–?)

## 2016-07-24 LAB — MICROALBUMIN, URINE WAIVED
Creatinine, Urine Waived: 300 mg/dL (ref 10–300)
Microalb, Ur Waived: 30 mg/L — ABNORMAL HIGH (ref 0–19)

## 2016-07-24 MED ORDER — CYCLOBENZAPRINE HCL 10 MG PO TABS: 10.0000 mg | ORAL_TABLET | Freq: Every day | ORAL | 1 refills | Status: DC

## 2016-07-24 MED ORDER — NAPROXEN 500 MG PO TABS: 500.0000 mg | ORAL_TABLET | Freq: Two times a day (BID) | ORAL | 1 refills | Status: DC

## 2016-07-24 NOTE — Assessment & Plan Note (Signed)
Checking labs today. Await results. Treat as needed.  

## 2016-07-24 NOTE — Assessment & Plan Note (Signed)
Rechecking urine today. Call with any concerns. No symptoms at this time.

## 2016-07-24 NOTE — Assessment & Plan Note (Signed)
Some stress, but doing well. Call with any concerns. Continue current regimen.

## 2016-07-24 NOTE — Assessment & Plan Note (Signed)
Using CPAP. No concerns. Continue to monitor.

## 2016-07-24 NOTE — Assessment & Plan Note (Signed)
Stable. Continue current regimen. Continue to monitor. Refills given today. 

## 2016-07-24 NOTE — Assessment & Plan Note (Signed)
Under good control. Continue current regimen. Continue to monitor. Call with any concerns. 

## 2016-07-24 NOTE — Assessment & Plan Note (Signed)
Rechecking labs today. Await results.  

## 2016-07-24 NOTE — Assessment & Plan Note (Signed)
Not an issue today

## 2016-07-24 NOTE — Progress Notes (Signed)
BP 118/64   Pulse 75   Temp 98.1 F (36.7 C)   Wt 183 lb 6.4 oz (83.2 kg)   SpO2 95%   BMI 27.64 kg/m    Subjective:    Patient ID: Zachary Hansen, male    DOB: [birth year], 75 y.o.   MRN: [id number]  HPI: Zachary Hansen is a 75 y.o. male  Chief Complaint  Patient presents with  . Other    Patient will check with the [place] about his immunizations and colonoscopy   HYPERTENSION Hypertension status: controlled  Satisfied with current treatment? yes Duration of hypertension: chronic BP monitoring frequency:  not checking BP medication side effects:  no Medication compliance: excellent compliance Previous BP meds: lisinopril, valsartan Aspirin: no Recurrent headaches: no Visual changes: no Palpitations: no Dyspnea: no Chest pain: no Lower extremity edema: no Dizzy/lightheaded: no   FATIGUE Duration:  6 months Severity: moderate  Onset: gradual Context when symptoms started:  unknown Symptoms improve with rest: yes  Depressive symptoms: no Stress/anxiety: yes- wife's health is bad  Insomnia: no  Snoring: yes Observed apnea by bed partner: yes- on CPAP Daytime hypersomnolence:no Wakes feeling refreshed: yes History of sleep study: yes Dysnea on exertion:  Yes- with heavy exertion Orthopnea/PND: no Chest pain: no Chronic cough: no Lower extremity edema: no Arthralgias:yes Myalgias: no Weakness: no Rash: no  Relevant past medical, surgical, family and social history reviewed and updated as indicated. Interim medical history since our last visit reviewed. Allergies and medications reviewed and updated.  Review of Systems  Constitutional: Negative.   Respiratory: Negative.   Cardiovascular: Negative.   Psychiatric/Behavioral: Negative.     Per HPI unless specifically indicated above     Objective:    BP 118/64   Pulse 75   Temp 98.1 F (36.7 C)   Wt 183 lb 6.4 oz (83.2 kg)   SpO2 95%   BMI 27.64 kg/m   Wt Readings from Last 3 Encounters:    07/24/16 183 lb 6.4 oz (83.2 kg)  02/07/16 181 lb (82.1 kg)  01/21/16 184 lb (83.5 kg)    Physical Exam  Constitutional: He is oriented to person, place, and time. He appears well-developed and well-nourished. No distress.  HENT:  Head: Normocephalic and atraumatic.  Right Ear: Hearing normal.  Left Ear: Hearing normal.  Nose: Nose normal.  Eyes: Conjunctivae and lids are normal. Right eye exhibits no discharge. Left eye exhibits no discharge. No scleral icterus.  Cardiovascular: Normal rate, regular rhythm, normal heart sounds and intact distal pulses.  Exam reveals no gallop and no friction rub.   No murmur heard. Pulmonary/Chest: Effort normal and breath sounds normal. No respiratory distress. He has no wheezes. He has no rales. He exhibits no tenderness.  Musculoskeletal: Normal range of motion.  Neurological: He is alert and oriented to person, place, and time.  Skin: Skin is warm, dry and intact. No rash noted. No erythema. No pallor.  Psychiatric: He has a normal mood and affect. His speech is normal and behavior is normal. Judgment and thought content normal. Cognition and memory are normal.  Nursing note and vitals reviewed.   Results for orders placed or performed in visit on 02/07/16  Microscopic Examination  Result Value Ref Range   WBC, UA 0-5 0 - 5 /hpf   RBC, UA 0-2 0 - 2 /hpf   Epithelial Cells (non renal) 0-10 0 - 10 /hpf   Bacteria, UA Few None seen/Few  UA/M w/rflx Culture, Routine (STAT)  Result Value Ref Range   Specific Gravity, UA 1.015 1.005 - 1.030   pH, UA 6.5 5.0 - 7.5   Color, UA Yellow Yellow   Appearance Ur Clear Clear   Leukocytes, UA Negative Negative   Protein, UA 1+ (A) Negative/Trace   Glucose, UA Negative Negative   Ketones, UA Negative Negative   RBC, UA Negative Negative   Bilirubin, UA Negative Negative   Urobilinogen, Ur 1.0 0.2 - 1.0 mg/dL   Nitrite, UA Negative Negative   Microscopic Examination See below:       Assessment &  Plan:   Problem List Items Addressed This Visit      Cardiovascular and Mediastinum   Essential hypertension - Primary    Under good control. Continue current regimen. Continue to monitor. Call with any concerns.       Relevant Medications   valsartan (DIOVAN) 40 MG tablet   Other Relevant Orders   Comprehensive metabolic panel   Microalbumin, Urine Waived     Respiratory   Sleep apnea    Using CPAP. No concerns. Continue to monitor.         Genitourinary   Malignant neoplasm of prostate ([place])    Rechecking labs today. Await results.       Relevant Orders   Comprehensive metabolic panel   PSA   Kidney stone    Rechecking urine today. Call with any concerns. No symptoms at this time.       Relevant Orders   Comprehensive metabolic panel   UA/M w/rflx Culture, Routine     Other   Bradycardia    Not an issue today.      Clinical depression    Some stress, but doing well. Call with any concerns. Continue current regimen.       Relevant Orders   CBC with Differential/Platelet   Comprehensive metabolic panel   TSH   Fatigue    Checking labs today. Await results. Treat as needed.       Relevant Orders   CBC with Differential/Platelet   Comprehensive metabolic panel   TSH   Testosterone, free, total   Right-sided low back pain with right-sided sciatica    Stable. Continue current regimen. Continue to monitor. Refills given today.      Relevant Medications   cyclobenzaprine (FLEXERIL) 10 MG tablet   naproxen (NAPROSYN) 500 MG tablet    Other Visit Diagnoses    Screening for cholesterol level       Relevant Orders   Lipid Panel w/o Chol/HDL Ratio       Follow up plan: Return in about 6 months (around 01/22/2017) for Records release from [place] please.

## 2016-07-25 LAB — CBC WITH DIFFERENTIAL/PLATELET
BASOS ABS: 0 10*3/uL (ref 0.0–0.2)
Basos: 1 %
EOS (ABSOLUTE): 0.1 10*3/uL (ref 0.0–0.4)
Eos: 2 %
HEMOGLOBIN: 14.2 g/dL (ref 12.6–17.7)
Hematocrit: 41.3 % (ref 37.5–51.0)
Immature Grans (Abs): 0 10*3/uL (ref 0.0–0.1)
Immature Granulocytes: 0 %
LYMPHS ABS: 1.8 10*3/uL (ref 0.7–3.1)
Lymphs: 35 %
MCH: 32.9 pg (ref 26.6–33.0)
MCHC: 34.4 g/dL (ref 31.5–35.7)
MCV: 96 fL (ref 79–97)
MONOCYTES: 7 %
MONOS ABS: 0.4 10*3/uL (ref 0.1–0.9)
NEUTROS ABS: 2.9 10*3/uL (ref 1.4–7.0)
Neutrophils: 55 %
Platelets: 186 10*3/uL (ref 150–379)
RBC: 4.31 x10E6/uL (ref 4.14–5.80)
RDW: 14.1 % (ref 12.3–15.4)
WBC: 5.2 10*3/uL (ref 3.4–10.8)

## 2016-07-25 LAB — COMPREHENSIVE METABOLIC PANEL
ALBUMIN: 4.3 g/dL (ref 3.5–4.8)
ALK PHOS: 85 IU/L (ref 39–117)
ALT: 20 IU/L (ref 0–44)
AST: 14 IU/L (ref 0–40)
Albumin/Globulin Ratio: 1.6 (ref 1.2–2.2)
BILIRUBIN TOTAL: 0.5 mg/dL (ref 0.0–1.2)
BUN / CREAT RATIO: 20 (ref 10–24)
BUN: 24 mg/dL (ref 8–27)
CHLORIDE: 101 mmol/L (ref 96–106)
CO2: 24 mmol/L (ref 18–29)
Calcium: 9.6 mg/dL (ref 8.6–10.2)
Creatinine, Ser: 1.18 mg/dL (ref 0.76–1.27)
GFR calc Af Amer: 71 mL/min/{1.73_m2} (ref >59)
GFR calc non Af Amer: 61 mL/min/{1.73_m2} (ref >59)
GLUCOSE: 127 mg/dL — AB (ref 65–99)
Globulin, Total: 2.7 g/dL (ref 1.5–4.5)
Potassium: 4.2 mmol/L (ref 3.5–5.2)
SODIUM: 142 mmol/L (ref 134–144)
Total Protein: 7 g/dL (ref 6.0–8.5)

## 2016-07-25 LAB — TSH: TSH: 1.14 u[IU]/mL (ref 0.450–4.500)

## 2016-07-25 LAB — LIPID PANEL W/O CHOL/HDL RATIO
CHOLESTEROL TOTAL: 190 mg/dL (ref 100–199)
HDL: 50 mg/dL (ref >39)
LDL Calculated: 104 mg/dL — ABNORMAL HIGH (ref 0–99)
Triglycerides: 182 mg/dL — ABNORMAL HIGH (ref 0–149)
VLDL CHOLESTEROL CAL: 36 mg/dL (ref 5–40)

## 2016-07-25 LAB — PSA: Prostate Specific Ag, Serum: <0.1 ng/mL (ref 0.0–4.0)

## 2016-07-26 ENCOUNTER — Other Ambulatory Visit: Payer: Self-pay | Admitting: Family Medicine

## 2016-07-26 ENCOUNTER — Telehealth: Payer: Self-pay | Admitting: Family Medicine

## 2016-07-26 ENCOUNTER — Encounter: Payer: Self-pay | Admitting: Family Medicine

## 2016-07-26 DIAGNOSIS — E291 Testicular hypofunction: Secondary | ICD-10-CM

## 2016-07-26 DIAGNOSIS — Z8546 Personal history of malignant neoplasm of prostate: Secondary | ICD-10-CM

## 2016-07-26 LAB — TESTOSTERONE, FREE, TOTAL, SHBG
SEX HORMONE BINDING: 27 nmol/L (ref 19.3–76.4)
TESTOSTERONE FREE: 3 pg/mL — AB (ref 6.6–18.1)
TESTOSTERONE: 145 ng/dL — AB (ref 264–916)

## 2016-07-26 NOTE — Telephone Encounter (Signed)
Called Zachary Hansen with his results. Everything looks good except his testosterone, which was low. However, given his history of prostate cancer, we don't want to just restart him, because it can make it come back. I am more than happy to send him to urology to see if they think it's a good idea. Let me know if he wants the referral. OK to give him this message if he calls back.

## 2016-07-27 NOTE — Telephone Encounter (Signed)
Called patient, no answer, left message for patient to return my call.  

## 2016-07-27 NOTE — Telephone Encounter (Signed)
Tiff, can you try to get him?

## 2016-07-28 DIAGNOSIS — E291 Testicular hypofunction: Secondary | ICD-10-CM | POA: Insufficient documentation

## 2016-07-28 NOTE — Telephone Encounter (Signed)
Patient is wondering how the cancer can come back since his prostate has been removed. Please advise.

## 2016-07-28 NOTE — Telephone Encounter (Signed)
There can be small amounts of tissue that were not removed entirely. This is why we usually have urology manage testosterone replacement in those with a history of prostate cancer.

## 2016-07-28 NOTE — Telephone Encounter (Signed)
Called patient, no answer left message for patient to return my call.  

## 2016-07-28 NOTE — Telephone Encounter (Signed)
Please place referral

## 2016-07-28 NOTE — Telephone Encounter (Signed)
Patient would like a referral to urology

## 2016-12-05 LAB — BASIC METABOLIC PANEL
BUN: 19 mg/dL (ref 4–21)
Creatinine: 1 mg/dL (ref 0.6–1.3)
Potassium: 4.1 mmol/L (ref 3.4–5.3)
Sodium: 141 mmol/L (ref 137–147)

## 2016-12-05 LAB — PSA: PSA: 0.1

## 2017-01-24 ENCOUNTER — Ambulatory Visit (INDEPENDENT_AMBULATORY_CARE_PROVIDER_SITE_OTHER): Payer: Medicare Other | Admitting: Family Medicine

## 2017-01-24 ENCOUNTER — Telehealth: Payer: Self-pay | Admitting: Family Medicine

## 2017-01-24 ENCOUNTER — Encounter: Payer: Self-pay | Admitting: Family Medicine

## 2017-01-24 VITALS — BP 131/78 | HR 58 | Temp 98.1°F | Ht 66.6 in | Wt 181.7 lb

## 2017-01-24 DIAGNOSIS — M5441 Lumbago with sciatica, right side: Secondary | ICD-10-CM | POA: Diagnosis not present

## 2017-01-24 DIAGNOSIS — E291 Testicular hypofunction: Secondary | ICD-10-CM

## 2017-01-24 DIAGNOSIS — F3341 Major depressive disorder, recurrent, in partial remission: Secondary | ICD-10-CM

## 2017-01-24 DIAGNOSIS — Z8546 Personal history of malignant neoplasm of prostate: Secondary | ICD-10-CM | POA: Diagnosis not present

## 2017-01-24 DIAGNOSIS — Z13220 Encounter for screening for lipoid disorders: Secondary | ICD-10-CM | POA: Diagnosis not present

## 2017-01-24 DIAGNOSIS — I129 Hypertensive chronic kidney disease with stage 1 through stage 4 chronic kidney disease, or unspecified chronic kidney disease: Secondary | ICD-10-CM

## 2017-01-24 DIAGNOSIS — G8929 Other chronic pain: Secondary | ICD-10-CM

## 2017-01-24 DIAGNOSIS — Z0000 Encounter for general adult medical examination without abnormal findings: Secondary | ICD-10-CM

## 2017-01-24 DIAGNOSIS — N200 Calculus of kidney: Secondary | ICD-10-CM

## 2017-01-24 DIAGNOSIS — Z7189 Other specified counseling: Secondary | ICD-10-CM | POA: Diagnosis not present

## 2017-01-24 LAB — MICROSCOPIC EXAMINATION
BACTERIA UA: NONE SEEN
RBC, UA: NONE SEEN /hpf (ref 0–?)
WBC UA: NONE SEEN /HPF (ref 0–?)

## 2017-01-24 LAB — UA/M W/RFLX CULTURE, ROUTINE
BILIRUBIN UA: NEGATIVE
Glucose, UA: NEGATIVE
KETONES UA: NEGATIVE
LEUKOCYTES UA: NEGATIVE
Nitrite, UA: NEGATIVE
PROTEIN UA: NEGATIVE
RBC UA: NEGATIVE
UUROB: 0.2 mg/dL (ref 0.2–1.0)
pH, UA: 5 (ref 5.0–7.5)

## 2017-01-24 LAB — MICROALBUMIN, URINE WAIVED
Creatinine, Urine Waived: 300 mg/dL (ref 10–300)
Microalb, Ur Waived: 30 mg/L — ABNORMAL HIGH (ref 0–19)
Microalb/Creat Ratio: <30 mg/g (ref <30)

## 2017-01-24 MED ORDER — NAPROXEN 500 MG PO TABS: 500.0000 mg | ORAL_TABLET | Freq: Two times a day (BID) | ORAL | 1 refills | Status: DC

## 2017-01-24 MED ORDER — FLUOXETINE HCL 20 MG PO CAPS: 20.0000 mg | ORAL_CAPSULE | Freq: Every day | ORAL | 1 refills | Status: DC

## 2017-01-24 MED ORDER — CYCLOBENZAPRINE HCL 10 MG PO TABS: 10.0000 mg | ORAL_TABLET | Freq: Every day | ORAL | 1 refills | Status: DC

## 2017-01-24 MED ORDER — TESTOSTERONE 25 MG/2.5GM (1%) TD GEL: TRANSDERMAL | 5 refills | Status: DC

## 2017-01-24 NOTE — Assessment & Plan Note (Signed)
Stable. Refills given. Continue to monitor.

## 2017-01-24 NOTE — Assessment & Plan Note (Signed)
Will start androgel. Recheck after he's been on meds for about a month.

## 2017-01-24 NOTE — Progress Notes (Signed)
BP 131/78 (BP Location: Left Arm, Patient Position: Sitting, Cuff Size: Large)   Pulse (!) 58   Temp 98.1 F (36.7 C)   Ht 5' 6.6" (1.692 m)   Wt 181 lb 11.2 oz (82.4 kg)   SpO2 95%   BMI 28.80 kg/m    Subjective:    Patient ID: Zachary Hansen, male    DOB: 04/02/1941, 76 y.o.   MRN: [id number]  HPI: Zachary Hansen is a 76 y.o. male presenting on 01/24/2017 for comprehensive medical examination. Current medical complaints include:  HYPERTENSION Hypertension status: controlled  Satisfied with current treatment? yes Duration of hypertension: chronic BP monitoring frequency:  not checking BP medication side effects:  no Medication compliance: excellent compliance Previous BP meds: valsartan Aspirin: yes Recurrent headaches: no Visual changes: no Palpitations: no Dyspnea: no Chest pain: no Lower extremity edema: no Dizzy/lightheaded: no  LOW TESTOSTERONE Duration: chronic Status: uncontrolled  Satisfied with current treatment:  Not on anything Previous testosterone therapies: Was on androgel in the past Medication side effects:  no Decreased libido: yes Fatigue: yes Depressed mood: yes Muscle weakness: no Erectile dysfunction: yes  DEPRESSION Mood status: controlled Satisfied with current treatment?: yes Symptom severity: mild  Duration of current treatment : chronic Side effects: no Medication compliance: excellent compliance Psychotherapy/counseling: no  Previous psychiatric medications: prozac Depressed mood: yes Anxious mood: no Anhedonia: no Significant weight loss or gain: no Insomnia: no  Fatigue: yes Feelings of worthlessness or guilt: no Impaired concentration/indecisiveness: no Suicidal ideations: no Hopelessness: no Crying spells: no Depression screen [facility] 2/9 01/24/2017 07/24/2016 12/20/2015 12/06/2015  Decreased Interest 0 0 1 1  Down, Depressed, Hopeless 0 0 1 1  PHQ - 2 Score 0 0 2 2  Altered sleeping - - - 0  Tired, decreased energy  - - - 3  Change in appetite - - - 0  Feeling bad or failure about yourself  - - - 0  Trouble concentrating - - - 0  Moving slowly or fidgety/restless - - - 0  Suicidal thoughts - - - 0  PHQ-9 Score - - - 5  Difficult doing work/chores - - - Somewhat difficult   Interim Problems from his last visit: no  Functional Status Survey: Is the patient deaf or have difficulty hearing?: Yes (Hearing aids) Does the patient have difficulty seeing, even when wearing glasses/contacts?: No Does the patient have difficulty concentrating, remembering, or making decisions?: Yes Does the patient have difficulty walking or climbing stairs?: No Does the patient have difficulty dressing or bathing?: No Does the patient have difficulty doing errands alone such as visiting a doctor's office or shopping?: No  FALL RISK: Fall Risk  01/24/2017 12/06/2015  Falls in the past year? No No    Depression Screen Depression screen [facility] 2/9 01/24/2017 07/24/2016 12/20/2015 12/06/2015  Decreased Interest 0 0 1 1  Down, Depressed, Hopeless 0 0 1 1  PHQ - 2 Score 0 0 2 2  Altered sleeping - - - 0  Tired, decreased energy - - - 3  Change in appetite - - - 0  Feeling bad or failure about yourself  - - - 0  Trouble concentrating - - - 0  Moving slowly or fidgety/restless - - - 0  Suicidal thoughts - - - 0  PHQ-9 Score - - - 5  Difficult doing work/chores - - - Somewhat difficult    Advanced Directives <no information>  Past Medical History:  Past Medical History:  Diagnosis Date  .  ADHD (attention deficit hyperactivity disorder)   . Depression   . Prostate cancer ([place])   . Sleep apnea     Surgical History:  Past Surgical History:  Procedure Laterality Date  . NASAL SEPTUM SURGERY    . prostate cancer surgery    . THROAT SURGERY     Due to snoring    Medications:  Current Outpatient Prescriptions on File Prior to Visit  Medication Sig  . aspirin EC 81 MG tablet Take 81 mg by mouth.  . Cyanocobalamin  (VITAMIN B 12 PO) Take 1,000 mcg by mouth daily.  . Misc Natural Products (OSTEO BI-FLEX JOINT SHIELD PO) Take by mouth.  . Multiple Vitamin (MULTI-VITAMINS) TABS Take by mouth.  . valsartan (DIOVAN) 40 MG tablet Take 80 mg by mouth daily.    No current facility-administered medications on file prior to visit.     Allergies:  Allergies  Allergen Reactions  . Lisinopril Cough    Social History:  Social History   Social History  . Marital status: Married    Spouse name: N/A  . Number of children: N/A  . Years of education: N/A   Occupational History  . Not on file.   Social History Main Topics  . Smoking status: Never Smoker  . Smokeless tobacco: Never Used  . Alcohol use 13.8 oz/week    21 Cans of beer, 2 Shots of liquor per week  . Drug use: No  . Sexual activity: No   Other Topics Concern  . Not on file   Social History Narrative  . No narrative on file   History  Smoking Status  . Never Smoker  Smokeless Tobacco  . Never Used   History  Alcohol Use  . 13.8 oz/week  . 21 Cans of beer, 2 Shots of liquor per week   Family History:  Family History  Problem Relation Age of Onset  . Arthritis Mother   . Heart disease Father   . Cancer Father   . Heart disease Sister   . Cancer Sister   . Sleep apnea Brother   . Alzheimer's disease Maternal Grandfather   . Heart disease Paternal Grandmother   . Diverticulitis Sister   . Sleep apnea Sister     Past medical history, surgical history, medications, allergies, family history and social history reviewed with patient today and changes made to appropriate areas of the chart.   Review of Systems  Constitutional: Positive for chills. Negative for diaphoresis, fever, malaise/fatigue and weight loss.  HENT: Positive for hearing loss. Negative for congestion, ear discharge, ear pain, nosebleeds, sinus pain, sore throat and tinnitus.   Eyes: Negative.   Respiratory: Negative.  Negative for stridor.     Cardiovascular: Negative.   Gastrointestinal: Positive for heartburn. Negative for abdominal pain, blood in stool, constipation, diarrhea, melena, nausea and vomiting.  Genitourinary: Negative.   Musculoskeletal: Positive for myalgias. Negative for back pain, falls, joint pain and neck pain.  Skin: Negative.   Neurological: Positive for weakness. Negative for dizziness, tingling, tremors, sensory change, speech change, focal weakness, seizures, loss of consciousness and headaches.  Endo/Heme/Allergies: Positive for environmental allergies. Negative for polydipsia. Does not bruise/bleed easily.  Psychiatric/Behavioral: Negative.     All other ROS negative except what is listed above and in the HPI.      Objective:    BP 131/78 (BP Location: Left Arm, Patient Position: Sitting, Cuff Size: Large)   Pulse (!) 58   Temp 98.1 F (36.7 C)  Ht 5' 6.6" (1.692 m)   Wt 181 lb 11.2 oz (82.4 kg)   SpO2 95%   BMI 28.80 kg/m   Wt Readings from Last 3 Encounters:  01/24/17 181 lb 11.2 oz (82.4 kg)  07/24/16 183 lb 6.4 oz (83.2 kg)  02/07/16 181 lb (82.1 kg)    Physical Exam  Constitutional: He is oriented to person, place, and time. He appears well-developed and well-nourished. No distress.  HENT:  Head: Normocephalic and atraumatic.  Right Ear: Hearing, tympanic membrane, external ear and ear canal normal.  Left Ear: Hearing, tympanic membrane, external ear and ear canal normal.  Nose: Nose normal.  Mouth/Throat: Uvula is midline, oropharynx is clear and moist and mucous membranes are normal. No oropharyngeal exudate.  Eyes: Conjunctivae, EOM and lids are normal. Pupils are equal, round, and reactive to light. Right eye exhibits no discharge. Left eye exhibits no discharge. No scleral icterus.  Neck: Normal range of motion. Neck supple. No JVD present. No tracheal deviation present. No thyromegaly present.  Cardiovascular: Normal rate, regular rhythm, normal heart sounds and intact distal  pulses.  Exam reveals no gallop and no friction rub.   No murmur heard. Pulmonary/Chest: Effort normal and breath sounds normal. No stridor. No respiratory distress. He has no wheezes. He has no rales. He exhibits no tenderness.  Abdominal: Soft. Bowel sounds are normal. He exhibits no distension and no mass. There is no tenderness. There is no rebound and no guarding.  Genitourinary:  Genitourinary Comments: Penis and prostate exams deferred- done at urology  Musculoskeletal: Normal range of motion. He exhibits no edema, tenderness or deformity.  Lymphadenopathy:    He has no cervical adenopathy.  Neurological: He is alert and oriented to person, place, and time. He has normal reflexes. He displays normal reflexes. No cranial nerve deficit. He exhibits normal muscle tone. Coordination normal.  Skin: Skin is warm, dry and intact. No rash noted. He is not diaphoretic. No erythema. No pallor.  Psychiatric: He has a normal mood and affect. His speech is normal and behavior is normal. Judgment and thought content normal. Cognition and memory are normal.  Nursing note and vitals reviewed.   6CIT Screen 01/24/2017  What Year? 0 points  What month? 0 points  What time? 0 points  Count back from 20 0 points  Months in reverse 2 points  Repeat phrase 0 points  Total Score 2      Results for orders placed or performed in visit on 35/00/93  Basic metabolic panel  Result Value Ref Range   BUN 19 4 - 21 mg/dL   Creatinine 1.0 0.6 - 1.3 mg/dL   Potassium 4.1 3.4 - 5.3 mmol/L   Sodium 141 137 - 147 mmol/L  PSA  Result Value Ref Range   PSA 0.1       Assessment & Plan:   Problem List Items Addressed This Visit      Endocrine   Hypogonadism in male    Will start androgel. Recheck after he's been on meds for about a month.       Relevant Orders   CBC with Differential/Platelet   Comprehensive metabolic panel   Testosterone, free, total     Nervous and Auditory   Right-sided low  back pain with right-sided sciatica    Stable. Continue current regimen. Continue to monitor. Refills given today.      Relevant Medications   FLUoxetine (PROZAC) 20 MG capsule   cyclobenzaprine (FLEXERIL) 10 MG tablet  naproxen (NAPROSYN) 500 MG tablet     Genitourinary   Kidney stone    Not having any problems. Will check UA today.      Relevant Orders   CBC with Differential/Platelet   Comprehensive metabolic panel   UA/M w/rflx Culture, Routine   Benign hypertensive renal disease    Under good control. Continue current regimen. Continue to monitor. Call with any concerns.       Relevant Orders   CBC with Differential/Platelet   Comprehensive metabolic panel   Microalbumin, Urine Waived     Other   Clinical depression    Stable. Refills given. Continue to monitor.       Relevant Medications   FLUoxetine (PROZAC) 20 MG capsule   Other Relevant Orders   CBC with Differential/Platelet   Comprehensive metabolic panel   TSH   History of malignant neoplasm of prostate    PSA checked at [facility]- following with [facility] urology. Will monitor closely with starting testosterone.       Relevant Orders   CBC with Differential/Platelet   Comprehensive metabolic panel   Advanced care planning/counseling discussion    He is unclear if he has a living will or not. Paper work given. He will read it over and call with any concerns.        Other Visit Diagnoses    Medicare annual wellness visit, subsequent    -  Primary   Preventative care discussed as below. Continue diet and exercise.    Screening for cholesterol level       Labs checked today. Await results.    Relevant Orders   Lipid Panel w/o Chol/HDL Ratio       Preventative Services:  Health Risk Assessment and Personalized Prevention Plan: Done today Bone Mass Measurements: N/A CVD Screening: Done today Colon Cancer Screening: up to date- get copy please Depression Screening: Done today Diabetes Screening: Done  today Glaucoma Screening: See your eye doctor Hepatitis B vaccine: N/A Hepatitis C screening: N/A HIV Screening: N/A Flu Vaccine: Get in October Lung cancer Screening: N/A Obesity Screening: Done today Pneumonia Vaccines (2):  up to date- get copy please STI Screening: N/A PSA screening: up to date  A voluntary discussion about advance care planning including the explanation and discussion of advance directives was extensively discussed  with the patient.  Explanation about the health care proxy and Living will was reviewed and packet with forms with explanation of how to fill them out was given.  During this discussion, the patient was not able to identify a health care proxy and plans to fill out the paperwork required.  Patient was offered a separate [facility] visit for further assistance with forms.    Discussed aspirin prophylaxis for myocardial infarction prevention and decision was made to continue ASA  LABORATORY TESTING:  Health maintenance labs ordered today as discussed above.   The natural history of prostate cancer and ongoing controversy regarding screening and potential treatment outcomes of prostate cancer has been discussed with the patient. The meaning of a false positive PSA and a false negative PSA has been discussed. He indicates understanding of the limitations of this screening test and wishes to proceed with screening PSA testing, done at [place] already and input into computer.   IMMUNIZATIONS:   - Tdap: Tetanus vaccination status reviewed: last tetanus booster within 10 years. - Influenza: Postponed to flu season - Pneumovax: Up to date - Prevnar: Up to date - Zostavax vaccine: Up to date  SCREENING: - Colonoscopy: Up to date  Discussed with patient purpose of the colonoscopy is to detect colon cancer at curable precancerous or early stages   PATIENT COUNSELING:    Sexuality: Discussed sexually transmitted diseases, partner selection, use of  condoms, avoidance of unintended pregnancy  and contraceptive alternatives.   Advised to avoid cigarette smoking.  I discussed with the patient that most people either abstain from alcohol or drink within safe limits (<=14/week and <=4 drinks/occasion for males, <=7/weeks and <= 3 drinks/occasion for females) and that the risk for alcohol disorders and other health effects rises proportionally with the number of drinks per week and how often a drinker exceeds daily limits.  Discussed cessation/primary prevention of drug use and availability of treatment for abuse.   Diet: Encouraged to adjust caloric intake to maintain  or achieve ideal body weight, to reduce intake of dietary saturated fat and total fat, to limit sodium intake by avoiding high sodium foods and not adding table salt, and to maintain adequate dietary potassium and calcium preferably from fresh fruits, vegetables, and low-fat dairy products.    stressed the importance of regular exercise  Injury prevention: Discussed safety belts, safety helmets, smoke detector, smoking near bedding or upholstery.   Dental health: Discussed importance of regular tooth brushing, flossing, and dental visits.   Follow up plan: NEXT PREVENTATIVE PHYSICAL DUE IN 1 YEAR. Return 5-6 weeks, for Follow up testosterone.

## 2017-01-24 NOTE — Patient Instructions (Addendum)
Preventative Services:  Health Risk Assessment and Personalized Prevention Plan: Done today Bone Mass Measurements: N/A CVD Screening: Done today Colon Cancer Screening: up to date- get copy please Depression Screening: Done today Diabetes Screening: Done today Glaucoma Screening: See your eye doctor Hepatitis B vaccine: N/A Hepatitis C screening: N/A HIV Screening: N/A Flu Vaccine: Get in October Lung cancer Screening: N/A Obesity Screening: Done today Pneumonia Vaccines (2):  up to date- get copy please STI Screening: N/A PSA screening: up to date   Health Maintenance, Male A healthy lifestyle and preventive care is important for your health and wellness. Ask your health care provider about what schedule of regular examinations is right for you. What should I know about weight and diet?  Eat a Healthy Diet  Eat plenty of vegetables, fruits, whole grains, low-fat dairy products, and lean protein.  Do not eat a lot of foods high in solid fats, added sugars, or salt. Maintain a Healthy Weight  Regular exercise can help you achieve or maintain a healthy weight. You should:  Do at least 150 minutes of exercise each week. The exercise should increase your heart rate and make you sweat (moderate-intensity exercise).  Do strength-training exercises at least twice a week. Watch Your Levels of Cholesterol and Blood Lipids  Have your blood tested for lipids and cholesterol every 5 years starting at 74 years of age. If you are at high risk for heart disease, you should start having your blood tested when you are 74 years old. You may need to have your cholesterol levels checked more often if:  Your lipid or cholesterol levels are high.  You are older than 74 years of age.  You are at high risk for heart disease. What should I know about cancer screening? Many types of cancers can be detected early and may often be prevented. Lung Cancer  You should be screened every year for lung  cancer if:  You are a current smoker who has smoked for at least 30 years.  You are a former smoker who has quit within the past 15 years.  Talk to your health care provider about your screening options, when you should start screening, and how often you should be screened. Colorectal Cancer  Routine colorectal cancer screening usually begins at 74 years of age and should be repeated every 5-10 years until you are 74 years old. You may need to be screened more often if early forms of precancerous polyps or small growths are found. Your health care provider may recommend screening at an earlier age if you have risk factors for colon cancer.  Your health care provider may recommend using home test kits to check for hidden blood in the stool.  A small camera at the end of a tube can be used to examine your colon (sigmoidoscopy or colonoscopy). This checks for the earliest forms of colorectal cancer. Prostate and Testicular Cancer  Depending on your age and overall health, your health care provider may do certain tests to screen for prostate and testicular cancer.  Talk to your health care provider about any symptoms or concerns you have about testicular or prostate cancer. Skin Cancer  Check your skin from head to toe regularly.  Tell your health care provider about any new moles or changes in moles, especially if:  There is a change in a mole's size, shape, or color.  You have a mole that is larger than a pencil eraser.  Always use sunscreen. Apply sunscreen liberally and  repeat throughout the day.  Protect yourself by wearing long sleeves, pants, a wide-brimmed hat, and sunglasses when outside. What should I know about heart disease, diabetes, and high blood pressure?  If you are 82-56 years of age, have your blood pressure checked every 3-5 years. If you are 2 years of age or older, have your blood pressure checked every year. You should have your blood pressure measured twice-once  when you are at a hospital or clinic, and once when you are not at a hospital or clinic. Record the average of the two measurements. To check your blood pressure when you are not at a hospital or clinic, you can use:  An automated blood pressure machine at a pharmacy.  A home blood pressure monitor.  Talk to your health care provider about your target blood pressure.  If you are between 64-52 years old, ask your health care provider if you should take aspirin to prevent heart disease.  Have regular diabetes screenings by checking your fasting blood sugar level.  If you are at a normal weight and have a low risk for diabetes, have this test once every three years after the age of 80.  If you are overweight and have a high risk for diabetes, consider being tested at a younger age or more often.  A one-time screening for abdominal aortic aneurysm (AAA) by ultrasound is recommended for men aged 57-75 years who are current or former smokers. What should I know about preventing infection? Hepatitis B  If you have a higher risk for hepatitis B, you should be screened for this virus. Talk with your health care provider to find out if you are at risk for hepatitis B infection. Hepatitis C  Blood testing is recommended for:  Everyone born from [birth year] through 1963.  Anyone with known risk factors for hepatitis C. Sexually Transmitted Diseases (STDs)  You should be screened each year for STDs including gonorrhea and chlamydia if:  You are sexually active and are younger than 74 years of age.  You are older than 74 years of age and your health care provider tells you that you are at risk for this type of infection.  Your sexual activity has changed since you were last screened and you are at an increased risk for chlamydia or gonorrhea. Ask your health care provider if you are at risk.  Talk with your health care provider about whether you are at high risk of being infected with HIV. Your health  care provider may recommend a prescription medicine to help prevent HIV infection. What else can I do?  Schedule regular health, dental, and eye exams.  Stay current with your vaccines (immunizations).  Do not use any tobacco products, such as cigarettes, chewing tobacco, and e-cigarettes. If you need help quitting, ask your health care provider.  Limit alcohol intake to no more than 2 drinks per day. One drink equals 12 ounces of beer, 5 ounces of wine, or 1 ounces of hard liquor.  Do not use street drugs.  Do not share needles.  Ask your health care provider for help if you need support or information about quitting drugs.  Tell your health care provider if you often feel depressed.  Tell your health care provider if you have ever been abused or do not feel safe at home. This information is not intended to replace advice given to you by your health care provider. Make sure you discuss any questions you have with your health care provider.  Document Released: 03/16/2008 Document Revised: 05/17/2016 Document Reviewed: 06/22/2015 Elsevier Interactive Patient Education  2017 [employer].

## 2017-01-24 NOTE — Assessment & Plan Note (Signed)
Under good control. Continue current regimen. Continue to monitor. Call with any concerns. 

## 2017-01-24 NOTE — Assessment & Plan Note (Signed)
PSA checked at [facility]- following with [facility] urology. Will monitor closely with starting testosterone.

## 2017-01-24 NOTE — Assessment & Plan Note (Signed)
Not having any problems. Will check UA today.

## 2017-01-24 NOTE — Telephone Encounter (Signed)
Pharmacist called in regards to patient about RX: Androgel 1:% and stated that patient's insurance does not cover the RX: Androgel 1. Pharmacist did inform that [employer] can cover the RX: Androgel 1.5% and all patient would have to pay is $45. Pharmacist wanted to get approval to see if patient can get the RX: Androgel 1.5%. Please Advise.   [facility] number: [phone]  Thank you.

## 2017-01-24 NOTE — Assessment & Plan Note (Signed)
He is unclear if he has a living will or not. Paper work given. He will read it over and call with any concerns.

## 2017-01-24 NOTE — Assessment & Plan Note (Signed)
Stable. Continue current regimen. Continue to monitor. Refills given today. 

## 2017-01-25 ENCOUNTER — Encounter: Payer: Self-pay | Admitting: Family Medicine

## 2017-01-25 LAB — COMPREHENSIVE METABOLIC PANEL
A/G RATIO: 1.7 (ref 1.2–2.2)
ALK PHOS: 86 IU/L (ref 39–117)
ALT: 20 IU/L (ref 0–44)
AST: 18 IU/L (ref 0–40)
Albumin: 4.6 g/dL (ref 3.5–4.8)
BUN/Creatinine Ratio: 17 (ref 10–24)
BUN: 18 mg/dL (ref 8–27)
Bilirubin Total: 0.6 mg/dL (ref 0.0–1.2)
CALCIUM: 9.6 mg/dL (ref 8.6–10.2)
CHLORIDE: 99 mmol/L (ref 96–106)
CO2: 23 mmol/L (ref 18–29)
Creatinine, Ser: 1.05 mg/dL (ref 0.76–1.27)
GFR calc Af Amer: 81 mL/min/{1.73_m2} (ref >59)
GFR, EST NON AFRICAN AMERICAN: 70 mL/min/{1.73_m2} (ref >59)
Globulin, Total: 2.7 g/dL (ref 1.5–4.5)
Glucose: 138 mg/dL — ABNORMAL HIGH (ref 65–99)
POTASSIUM: 4 mmol/L (ref 3.5–5.2)
Sodium: 139 mmol/L (ref 134–144)
Total Protein: 7.3 g/dL (ref 6.0–8.5)

## 2017-01-25 LAB — CBC WITH DIFFERENTIAL/PLATELET
Basophils Absolute: 0 10*3/uL (ref 0.0–0.2)
Basos: 1 %
EOS (ABSOLUTE): 0.1 10*3/uL (ref 0.0–0.4)
Eos: 1 %
Hematocrit: 41.1 % (ref 37.5–51.0)
Hemoglobin: 14.5 g/dL (ref 13.0–17.7)
IMMATURE GRANULOCYTES: 1 %
Immature Grans (Abs): 0 10*3/uL (ref 0.0–0.1)
Lymphocytes Absolute: 1.3 10*3/uL (ref 0.7–3.1)
Lymphs: 31 %
MCH: 32.9 pg (ref 26.6–33.0)
MCHC: 35.3 g/dL (ref 31.5–35.7)
MCV: 93 fL (ref 79–97)
MONOS ABS: 0.3 10*3/uL (ref 0.1–0.9)
Monocytes: 6 %
NEUTROS PCT: 60 %
Neutrophils Absolute: 2.5 10*3/uL (ref 1.4–7.0)
Platelets: 209 10*3/uL (ref 150–379)
RBC: 4.41 x10E6/uL (ref 4.14–5.80)
RDW: 13.7 % (ref 12.3–15.4)
WBC: 4.2 10*3/uL (ref 3.4–10.8)

## 2017-01-25 LAB — LIPID PANEL W/O CHOL/HDL RATIO
CHOLESTEROL TOTAL: 220 mg/dL — AB (ref 100–199)
HDL: 47 mg/dL (ref >39)
LDL CALC: 125 mg/dL — AB (ref 0–99)
Triglycerides: 240 mg/dL — ABNORMAL HIGH (ref 0–149)
VLDL CHOLESTEROL CAL: 48 mg/dL — AB (ref 5–40)

## 2017-01-25 LAB — TSH: TSH: 2.2 u[IU]/mL (ref 0.450–4.500)

## 2017-01-25 LAB — TESTOSTERONE, FREE, TOTAL, SHBG
SEX HORMONE BINDING: 35.3 nmol/L (ref 19.3–76.4)
TESTOSTERONE FREE: 4.5 pg/mL — AB (ref 6.6–18.1)
TESTOSTERONE: 195 ng/dL — AB (ref 264–916)

## 2017-01-25 NOTE — Telephone Encounter (Signed)
Do not have that in the computer to send over. Rx written by hand. OK to fax over. Thanks!

## 2017-03-07 ENCOUNTER — Encounter: Payer: Self-pay | Admitting: Family Medicine

## 2017-03-07 ENCOUNTER — Ambulatory Visit (INDEPENDENT_AMBULATORY_CARE_PROVIDER_SITE_OTHER): Payer: Medicare Other | Admitting: Family Medicine

## 2017-03-07 VITALS — BP 132/81 | HR 64 | Temp 98.5°F | Wt 183.4 lb

## 2017-03-07 DIAGNOSIS — Z7189 Other specified counseling: Secondary | ICD-10-CM

## 2017-03-07 DIAGNOSIS — E291 Testicular hypofunction: Secondary | ICD-10-CM

## 2017-03-07 NOTE — Assessment & Plan Note (Signed)
Feeling better. Rechecking levels today. Await results. Continue to monitor.

## 2017-03-07 NOTE — Assessment & Plan Note (Signed)
Form filled out- not yet notarized. Discussed that he needs to get it notarized to be a legal document.

## 2017-03-07 NOTE — Progress Notes (Signed)
BP 132/81 (BP Location: Left Arm, Patient Position: Sitting, Cuff Size: Normal)   Pulse 64   Temp 98.5 F (36.9 C)   Wt 183 lb 6.4 oz (83.2 kg)   SpO2 97%   BMI 29.07 kg/m    Subjective:    Patient ID: Zachary Hansen, male    DOB: 1941/04/05, 76 y.o.   MRN: [id number]  HPI: Zachary Hansen is a 76 y.o. male  Chief Complaint  Patient presents with  . Hypogonadism   LOW TESTOSTERONE Duration: chronic Status: better  Satisfied with current treatment:  yes Previous testosterone therapies: androgel Medication side effects:  no Medication compliance: excellent compliance Decreased libido: yes Fatigue: yes Depressed mood: no Muscle weakness: no Erectile dysfunction: yes  Relevant past medical, surgical, family and social history reviewed and updated as indicated. Interim medical history since our last visit reviewed. Allergies and medications reviewed and updated.  Review of Systems  Constitutional: Negative.   Respiratory: Negative.   Cardiovascular: Negative.   Psychiatric/Behavioral: Negative.     Per HPI unless specifically indicated above     Objective:    BP 132/81 (BP Location: Left Arm, Patient Position: Sitting, Cuff Size: Normal)   Pulse 64   Temp 98.5 F (36.9 C)   Wt 183 lb 6.4 oz (83.2 kg)   SpO2 97%   BMI 29.07 kg/m   Wt Readings from Last 3 Encounters:  03/07/17 183 lb 6.4 oz (83.2 kg)  01/24/17 181 lb 11.2 oz (82.4 kg)  07/24/16 183 lb 6.4 oz (83.2 kg)    Physical Exam  Constitutional: He is oriented to person, place, and time. He appears well-developed and well-nourished. No distress.  HENT:  Head: Normocephalic and atraumatic.  Right Ear: Hearing normal.  Left Ear: Hearing normal.  Nose: Nose normal.  Eyes: Conjunctivae and lids are normal. Right eye exhibits no discharge. Left eye exhibits no discharge. No scleral icterus.  Cardiovascular: Normal rate, regular rhythm and intact distal pulses.  Exam reveals no gallop and no  friction rub.   Murmur heard. Pulmonary/Chest: Effort normal and breath sounds normal. No respiratory distress. He has no wheezes. He has no rales. He exhibits no tenderness.  Musculoskeletal: Normal range of motion.  Neurological: He is alert and oriented to person, place, and time.  Skin: Skin is warm, dry and intact. No rash noted. He is not diaphoretic. No erythema. No pallor.  Psychiatric: He has a normal mood and affect. His speech is normal and behavior is normal. Judgment and thought content normal. Cognition and memory are normal.  Nursing note and vitals reviewed.   Results for orders placed or performed in visit on 01/24/17  Microscopic Examination  Result Value Ref Range   WBC, UA None seen 0 - 5 /hpf   RBC, UA None seen 0 - 2 /hpf   Epithelial Cells (non renal) CANCELED    Bacteria, UA None seen None seen/Few  CBC with Differential/Platelet  Result Value Ref Range   WBC 4.2 3.4 - 10.8 x10E3/uL   RBC 4.41 4.14 - 5.80 x10E6/uL   Hemoglobin 14.5 13.0 - 17.7 g/dL   Hematocrit 41.1 37.5 - 51.0 %   MCV 93 79 - 97 fL   MCH 32.9 26.6 - 33.0 pg   MCHC 35.3 31.5 - 35.7 g/dL   RDW 13.7 12.3 - 15.4 %   Platelets 209 150 - 379 x10E3/uL   Neutrophils 60 Not Estab. %   Lymphs 31 Not Estab. %   Monocytes 6  Not Estab. %   Eos 1 Not Estab. %   Basos 1 Not Estab. %   Neutrophils Absolute 2.5 1.4 - 7.0 x10E3/uL   Lymphocytes Absolute 1.3 0.7 - 3.1 x10E3/uL   Monocytes Absolute 0.3 0.1 - 0.9 x10E3/uL   EOS (ABSOLUTE) 0.1 0.0 - 0.4 x10E3/uL   Basophils Absolute 0.0 0.0 - 0.2 x10E3/uL   Immature Granulocytes 1 Not Estab. %   Immature Grans (Abs) 0.0 0.0 - 0.1 x10E3/uL  Comprehensive metabolic panel  Result Value Ref Range   Glucose 138 (H) 65 - 99 mg/dL   BUN 18 8 - 27 mg/dL   Creatinine, Ser 1.05 0.76 - 1.27 mg/dL   GFR calc non Af Amer 70 >59 mL/min/1.73   GFR calc Af Amer 81 >59 mL/min/1.73   BUN/Creatinine Ratio 17 10 - 24   Sodium 139 134 - 144 mmol/L   Potassium 4.0 3.5 -  5.2 mmol/L   Chloride 99 96 - 106 mmol/L   CO2 23 18 - 29 mmol/L   Calcium 9.6 8.6 - 10.2 mg/dL   Total Protein 7.3 6.0 - 8.5 g/dL   Albumin 4.6 3.5 - 4.8 g/dL   Globulin, Total 2.7 1.5 - 4.5 g/dL   Albumin/Globulin Ratio 1.7 1.2 - 2.2   Bilirubin Total 0.6 0.0 - 1.2 mg/dL   Alkaline Phosphatase 86 39 - 117 IU/L   AST 18 0 - 40 IU/L   ALT 20 0 - 44 IU/L  Microalbumin, Urine Waived  Result Value Ref Range   Microalb, Ur Waived 30 (H) 0 - 19 mg/L   Creatinine, Urine Waived 300 10 - 300 mg/dL   Microalb/Creat Ratio <30 <30 mg/g  Lipid Panel w/o Chol/HDL Ratio  Result Value Ref Range   Cholesterol, Total 220 (H) 100 - 199 mg/dL   Triglycerides 240 (H) 0 - 149 mg/dL   HDL 47 >39 mg/dL   VLDL Cholesterol Cal 48 (H) 5 - 40 mg/dL   LDL Calculated 125 (H) 0 - 99 mg/dL  TSH  Result Value Ref Range   TSH 2.200 0.450 - 4.500 uIU/mL  UA/M w/rflx Culture, Routine  Result Value Ref Range   Specific Gravity, UA >1.030 (H) 1.005 - 1.030   pH, UA 5.0 5.0 - 7.5   Color, UA Yellow Yellow   Appearance Ur Clear Clear   Leukocytes, UA Negative Negative   Protein, UA Negative Negative/Trace   Glucose, UA Negative Negative   Ketones, UA Negative Negative   RBC, UA Negative Negative   Bilirubin, UA Negative Negative   Urobilinogen, Ur 0.2 0.2 - 1.0 mg/dL   Nitrite, UA Negative Negative   Microscopic Examination See below:   Testosterone, free, total  Result Value Ref Range   Testosterone 195 (L) 264 - 916 ng/dL   Testosterone, Free 4.5 (L) 6.6 - 18.1 pg/mL   Sex Hormone Binding 35.3 19.3 - 76.4 nmol/L  Basic metabolic panel  Result Value Ref Range   BUN 19 4 - 21 mg/dL   Creatinine 1.0 0.6 - 1.3 mg/dL   Potassium 4.1 3.4 - 5.3 mmol/L   Sodium 141 137 - 147 mmol/L  PSA  Result Value Ref Range   PSA 0.1       Assessment & Plan:   Problem List Items Addressed This Visit      Endocrine   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 Rechecking levels today. Await results.  Continue to monitor.       Relevant Orders  Testosterone, free, total     Other   Advanced care planning/counseling discussion    Form filled out- not yet notarized. Discussed that he needs to get it notarized to be a legal document.           Follow up plan: Return in about 6 months (around 09/06/2017) for Follow up BP/mood.

## 2017-03-09 ENCOUNTER — Encounter: Payer: Self-pay | Admitting: Family Medicine

## 2017-03-09 LAB — TESTOSTERONE, FREE, TOTAL, SHBG
SEX HORMONE BINDING: 33.2 nmol/L (ref 19.3–76.4)
Testosterone, Free: 11.3 pg/mL (ref 6.6–18.1)
Testosterone: 484 ng/dL (ref 264–916)

## 2017-05-16 DIAGNOSIS — H2513 Age-related nuclear cataract, bilateral: Secondary | ICD-10-CM | POA: Diagnosis not present

## 2017-05-16 DIAGNOSIS — H52223 Regular astigmatism, bilateral: Secondary | ICD-10-CM | POA: Diagnosis not present

## 2017-05-16 DIAGNOSIS — H43813 Vitreous degeneration, bilateral: Secondary | ICD-10-CM | POA: Diagnosis not present

## 2017-07-19 ENCOUNTER — Other Ambulatory Visit: Payer: Self-pay | Admitting: Family Medicine

## 2017-09-07 ENCOUNTER — Encounter: Payer: Self-pay | Admitting: Family Medicine

## 2017-09-07 ENCOUNTER — Ambulatory Visit (INDEPENDENT_AMBULATORY_CARE_PROVIDER_SITE_OTHER): Payer: Medicare Other | Admitting: Family Medicine

## 2017-09-07 VITALS — BP 169/78 | HR 49 | Temp 97.8°F | Wt 182.5 lb

## 2017-09-07 DIAGNOSIS — F331 Major depressive disorder, recurrent, moderate: Secondary | ICD-10-CM | POA: Diagnosis not present

## 2017-09-07 DIAGNOSIS — E782 Mixed hyperlipidemia: Secondary | ICD-10-CM

## 2017-09-07 DIAGNOSIS — E785 Hyperlipidemia, unspecified: Secondary | ICD-10-CM | POA: Insufficient documentation

## 2017-09-07 DIAGNOSIS — I129 Hypertensive chronic kidney disease with stage 1 through stage 4 chronic kidney disease, or unspecified chronic kidney disease: Secondary | ICD-10-CM

## 2017-09-07 DIAGNOSIS — F4321 Adjustment disorder with depressed mood: Secondary | ICD-10-CM

## 2017-09-07 MED ORDER — FLUOXETINE HCL 20 MG PO CAPS: 20.0000 mg | ORAL_CAPSULE | Freq: Every day | ORAL | 1 refills | Status: DC

## 2017-09-07 MED ORDER — VALSARTAN 40 MG PO TABS: 80.0000 mg | ORAL_TABLET | Freq: Every day | ORAL | 1 refills | Status: DC

## 2017-09-07 NOTE — Assessment & Plan Note (Signed)
Not under good control. Not taking his medicine since his wife died due to depression. Discussed strategies for remembering his medicine. Will restart it and recheck 2 weeks.

## 2017-09-07 NOTE — Assessment & Plan Note (Signed)
Not on any medicine right now. Rechecking labs today. Will treat as needed.

## 2017-09-07 NOTE — Progress Notes (Signed)
BP (!) 169/78 (BP Location: Left Arm, Patient Position: Sitting, Cuff Size: Normal)   Pulse (!) 49   Temp 97.8 F (36.6 C)   Wt 182 lb 8 oz (82.8 kg)   SpO2 97%   BMI 28.93 kg/m    Subjective:    Patient ID: Zachary Hansen, male    DOB: Dec 16, 1940, 76 y.o.   MRN: [id number]  HPI: Zachary Hansen is a 76 y.o. male  Chief Complaint  Patient presents with  . Hypertension  . Hyperlipidemia  . Depression   HYPERTENSION / HYPERLIPIDEMIA- forgetting to take his medicine more than 1-3x a week. Not doing well since his wife passed Satisfied with current treatment? no Duration of hypertension: chronic BP monitoring frequency: not checking BP range:  BP medication side effects: no Past BP meds: valsartan Duration of hyperlipidemia: chronic Cholesterol medication side effects: no Cholesterol supplements: none Past cholesterol medications: none Medication compliance: poor compliance Aspirin: yes Recent stressors: yes Recurrent headaches: no Visual changes: no Palpitations: no Dyspnea: no Chest pain: no Lower extremity edema: no Dizzy/lightheaded: no  DEPRESSION- doing worse. His wife passed away about a month ago. His step-daughter wouldn't let him see her. So she is doing much worse. Hasn't been taking his prozac.  Mood status: exacerbated Satisfied with current treatment?: no Symptom severity: moderate  Duration of current treatment : has been off medicine for the last month or so Side effects: no Medication compliance: poor compliance Psychotherapy/counseling: no  Previous psychiatric medications: prozac Depressed mood: yes Anxious mood: no Anhedonia: no Significant weight loss or gain: no Insomnia: no  Fatigue: yes Feelings of worthlessness or guilt: yes Impaired concentration/indecisiveness: no Suicidal ideations: no Hopelessness: yes Crying spells: yes Depression screen [facility] 2/9 09/07/2017 01/24/2017 07/24/2016 12/20/2015 12/06/2015  Decreased Interest 3 0 0  1 1  Down, Depressed, Hopeless 3 0 0 1 1  PHQ - 2 Score 6 0 0 2 2  Altered sleeping 3 - - - 0  Tired, decreased energy 3 - - - 3  Change in appetite 3 - - - 0  Feeling bad or failure about yourself  1 - - - 0  Trouble concentrating 1 - - - 0  Moving slowly or fidgety/restless 0 - - - 0  Suicidal thoughts 0 - - - 0  PHQ-9 Score 17 - - - 5  Difficult doing work/chores Very difficult - - - Somewhat difficult     Relevant past medical, surgical, family and social history reviewed and updated as indicated. Interim medical history since our last visit reviewed. Allergies and medications reviewed and updated.  Review of Systems  Constitutional: Positive for fatigue. Negative for activity change, appetite change, chills, diaphoresis, fever and unexpected weight change.  Respiratory: Negative.   Cardiovascular: Negative.   Psychiatric/Behavioral: Positive for confusion, decreased concentration and dysphoric mood. Negative for agitation, behavioral problems, hallucinations, self-injury, sleep disturbance and suicidal ideas. The patient is not nervous/anxious and is not hyperactive.     Per HPI unless specifically indicated above     Objective:    BP (!) 169/78 (BP Location: Left Arm, Patient Position: Sitting, Cuff Size: Normal)   Pulse (!) 49   Temp 97.8 F (36.6 C)   Wt 182 lb 8 oz (82.8 kg)   SpO2 97%   BMI 28.93 kg/m   Wt Readings from Last 3 Encounters:  09/07/17 182 lb 8 oz (82.8 kg)  03/07/17 183 lb 6.4 oz (83.2 kg)  01/24/17 181 lb 11.2 oz (82.4 kg)  Physical Exam  Constitutional: He is oriented to person, place, and time. He appears well-developed and well-nourished. No distress.  HENT:  Head: Normocephalic and atraumatic.  Right Ear: Hearing normal.  Left Ear: Hearing normal.  Nose: Nose normal.  Eyes: Conjunctivae and lids are normal. Right eye exhibits no discharge. Left eye exhibits no discharge. No scleral icterus.  Cardiovascular: Normal rate, regular rhythm,  normal heart sounds and intact distal pulses. Exam reveals no gallop and no friction rub.  No murmur heard. Pulmonary/Chest: Effort normal and breath sounds normal. No respiratory distress. He has no wheezes. He has no rales. He exhibits no tenderness.  Musculoskeletal: Normal range of motion.  Neurological: He is alert and oriented to person, place, and time.  Skin: Skin is warm, dry and intact. No rash noted. He is not diaphoretic. No erythema. No pallor.  Psychiatric: His speech is normal and behavior is normal. Judgment and thought content normal. Cognition and memory are normal. He exhibits a depressed mood.  Nursing note and vitals reviewed.   Results for orders placed or performed in visit on 03/07/17  Testosterone, free, total  Result Value Ref Range   Testosterone 484 264 - 916 ng/dL   Testosterone, Free 11.3 6.6 - 18.1 pg/mL   Sex Hormone Binding 33.2 19.3 - 76.4 nmol/L      Assessment & Plan:   Problem List Items Addressed This Visit      Genitourinary   Benign hypertensive renal disease - Primary    Not under good control. Not taking his medicine since his wife died due to depression. Discussed strategies for remembering his medicine. Will restart it and recheck 2 weeks.       Relevant Orders   Comprehensive metabolic panel     Other   Clinical depression    Exacerbated with the loss of his wife. Not taking his medicine. Will restart his medicine and get grief counseling in. Recheck 1-2 weeks.       Relevant Medications   FLUoxetine (PROZAC) 20 MG capsule   Hyperlipidemia    Not on any medicine right now. Rechecking labs today. Will treat as needed.       Relevant Medications   valsartan (DIOVAN) 40 MG tablet   Other Relevant Orders   Comprehensive metabolic panel   Lipid Panel w/o Chol/HDL Ratio    Other Visit Diagnoses    Grieving       Call to hospice about grief counseling- waiting on call back. Will give him information.        Follow up  plan: Return 1-2 weeks, for follow up depression and BP.

## 2017-09-07 NOTE — Assessment & Plan Note (Signed)
Exacerbated with the loss of his wife. Not taking his medicine. Will restart his medicine and get grief counseling in. Recheck 1-2 weeks.

## 2017-09-08 LAB — COMPREHENSIVE METABOLIC PANEL
A/G RATIO: 2 (ref 1.2–2.2)
ALBUMIN: 4.6 g/dL (ref 3.5–4.8)
ALK PHOS: 94 IU/L (ref 39–117)
ALT: 18 IU/L (ref 0–44)
AST: 24 IU/L (ref 0–40)
BUN / CREAT RATIO: 12 (ref 10–24)
BUN: 13 mg/dL (ref 8–27)
Bilirubin Total: 0.7 mg/dL (ref 0.0–1.2)
CALCIUM: 9.3 mg/dL (ref 8.6–10.2)
CO2: 26 mmol/L (ref 20–29)
Chloride: 100 mmol/L (ref 96–106)
Creatinine, Ser: 1.06 mg/dL (ref 0.76–1.27)
GFR calc Af Amer: 80 mL/min/{1.73_m2} (ref >59)
GFR, EST NON AFRICAN AMERICAN: 69 mL/min/{1.73_m2} (ref >59)
GLOBULIN, TOTAL: 2.3 g/dL (ref 1.5–4.5)
Glucose: 109 mg/dL — ABNORMAL HIGH (ref 65–99)
POTASSIUM: 4.2 mmol/L (ref 3.5–5.2)
SODIUM: 138 mmol/L (ref 134–144)
Total Protein: 6.9 g/dL (ref 6.0–8.5)

## 2017-09-08 LAB — LIPID PANEL W/O CHOL/HDL RATIO
Cholesterol, Total: 199 mg/dL (ref 100–199)
HDL: 48 mg/dL (ref >39)
LDL Calculated: 110 mg/dL — ABNORMAL HIGH (ref 0–99)
TRIGLYCERIDES: 204 mg/dL — AB (ref 0–149)
VLDL Cholesterol Cal: 41 mg/dL — ABNORMAL HIGH (ref 5–40)

## 2017-09-11 ENCOUNTER — Encounter: Payer: Self-pay | Admitting: Family Medicine

## 2017-09-21 ENCOUNTER — Ambulatory Visit (INDEPENDENT_AMBULATORY_CARE_PROVIDER_SITE_OTHER): Payer: Medicare Other | Admitting: Family Medicine

## 2017-09-21 ENCOUNTER — Encounter: Payer: Self-pay | Admitting: Family Medicine

## 2017-09-21 VITALS — BP 138/89 | HR 69 | Wt 181.3 lb

## 2017-09-21 DIAGNOSIS — E782 Mixed hyperlipidemia: Secondary | ICD-10-CM | POA: Diagnosis not present

## 2017-09-21 DIAGNOSIS — Z8546 Personal history of malignant neoplasm of prostate: Secondary | ICD-10-CM | POA: Diagnosis not present

## 2017-09-21 DIAGNOSIS — F331 Major depressive disorder, recurrent, moderate: Secondary | ICD-10-CM

## 2017-09-21 DIAGNOSIS — I129 Hypertensive chronic kidney disease with stage 1 through stage 4 chronic kidney disease, or unspecified chronic kidney disease: Secondary | ICD-10-CM

## 2017-09-21 NOTE — Assessment & Plan Note (Signed)
Will recheck PSA- ordered today. Will send copy to urologist at [facility]- will call with the name of the urologist.

## 2017-09-21 NOTE — Progress Notes (Signed)
BP 138/89 (BP Location: Right Arm, Patient Position: Sitting, Cuff Size: Normal)   Pulse 69   Wt 181 lb 5 oz (82.2 kg)   SpO2 92%   BMI 28.74 kg/m    Subjective:    Patient ID: Zachary Hansen, male    DOB: [birth year], 76 y.o.   MRN: [id number]  HPI: Zachary Hansen is a 76 y.o. male  No chief complaint on file.  HYPERTENSION / HYPERLIPIDEMIA- has been making sure to take his medicine again. Feeling well Satisfied with current treatment? yes Duration of hypertension: chronic BP monitoring frequency: not checking BP medication side effects: no Duration of hyperlipidemia: chronic Cholesterol medication side effects: no Cholesterol supplements: none Medication compliance: good compliance Aspirin: yes Recent stressors: yes Recurrent headaches: no Visual changes: no Palpitations: no Dyspnea: no Chest pain: no Lower extremity edema: no Dizzy/lightheaded: no  DEPRESSION Mood status: better Satisfied with current treatment?: yes Symptom severity: moderate  Duration of current treatment : 2 weeks Side effects: no Medication compliance: good compliance Psychotherapy/counseling: no  Previous psychiatric medications: prozac Depressed mood: yes Anxious mood: yes Anhedonia: no Significant weight loss or gain: no Insomnia: no  Fatigue: yes Feelings of worthlessness or guilt: no Impaired concentration/indecisiveness: no Suicidal ideations: no Hopelessness: no Crying spells: yes Depression screen [facility] 2/9 09/07/2017 01/24/2017 07/24/2016 12/20/2015 12/06/2015  Decreased Interest 3 0 0 1 1  Down, Depressed, Hopeless 3 0 0 1 1  PHQ - 2 Score 6 0 0 2 2  Altered sleeping 3 - - - 0  Tired, decreased energy 3 - - - 3  Change in appetite 3 - - - 0  Feeling bad or failure about yourself  1 - - - 0  Trouble concentrating 1 - - - 0  Moving slowly or fidgety/restless 0 - - - 0  Suicidal thoughts 0 - - - 0  PHQ-9 Score 17 - - - 5  Difficult doing work/chores Very difficult - - -  Somewhat difficult     Relevant past medical, surgical, family and social history reviewed and updated as indicated. Interim medical history since our last visit reviewed. Allergies and medications reviewed and updated.  Review of Systems  Constitutional: Negative.   Respiratory: Negative.   Cardiovascular: Negative.   Psychiatric/Behavioral: Positive for dysphoric mood. Negative for agitation, behavioral problems, confusion, decreased concentration, hallucinations, self-injury, sleep disturbance and suicidal ideas. The patient is nervous/anxious. The patient is not hyperactive.     Per HPI unless specifically indicated above     Objective:    BP 138/89 (BP Location: Right Arm, Patient Position: Sitting, Cuff Size: Normal)   Pulse 69   Wt 181 lb 5 oz (82.2 kg)   SpO2 92%   BMI 28.74 kg/m   Wt Readings from Last 3 Encounters:  09/21/17 181 lb 5 oz (82.2 kg)  09/07/17 182 lb 8 oz (82.8 kg)  03/07/17 183 lb 6.4 oz (83.2 kg)    Physical Exam  Constitutional: He is oriented to person, place, and time. He appears well-developed and well-nourished. No distress.  HENT:  Head: Normocephalic and atraumatic.  Right Ear: Hearing normal.  Left Ear: Hearing normal.  Nose: Nose normal.  Eyes: Conjunctivae and lids are normal. Right eye exhibits no discharge. Left eye exhibits no discharge. No scleral icterus.  Cardiovascular: Normal rate, regular rhythm, normal heart sounds and intact distal pulses. Exam reveals no gallop and no friction rub.  No murmur heard. Pulmonary/Chest: Effort normal and breath sounds normal. No respiratory distress.  He has no wheezes. He has no rales. He exhibits no tenderness.  Musculoskeletal: Normal range of motion.  Neurological: He is alert and oriented to person, place, and time.  Skin: Skin is warm, dry and intact. No rash noted. He is not diaphoretic. No erythema. No pallor.  Psychiatric: He has a normal mood and affect. His speech is normal and behavior  is normal. Judgment and thought content normal. Cognition and memory are normal.  Nursing note and vitals reviewed.   Results for orders placed or performed in visit on 09/07/17  Comprehensive metabolic panel  Result Value Ref Range   Glucose 109 (H) 65 - 99 mg/dL   BUN 13 8 - 27 mg/dL   Creatinine, Ser 1.06 0.76 - 1.27 mg/dL   GFR calc non Af Amer 69 >59 mL/min/1.73   GFR calc Af Amer 80 >59 mL/min/1.73   BUN/Creatinine Ratio 12 10 - 24   Sodium 138 134 - 144 mmol/L   Potassium 4.2 3.5 - 5.2 mmol/L   Chloride 100 96 - 106 mmol/L   CO2 26 20 - 29 mmol/L   Calcium 9.3 8.6 - 10.2 mg/dL   Total Protein 6.9 6.0 - 8.5 g/dL   Albumin 4.6 3.5 - 4.8 g/dL   Globulin, Total 2.3 1.5 - 4.5 g/dL   Albumin/Globulin Ratio 2.0 1.2 - 2.2   Bilirubin Total 0.7 0.0 - 1.2 mg/dL   Alkaline Phosphatase 94 39 - 117 IU/L   AST 24 0 - 40 IU/L   ALT 18 0 - 44 IU/L  Lipid Panel w/o Chol/HDL Ratio  Result Value Ref Range   Cholesterol, Total 199 100 - 199 mg/dL   Triglycerides 204 (H) 0 - 149 mg/dL   HDL 48 >39 mg/dL   VLDL Cholesterol Cal 41 (H) 5 - 40 mg/dL   LDL Calculated 110 (H) 0 - 99 mg/dL      Assessment & Plan:   Problem List Items Addressed This Visit      Genitourinary   Benign hypertensive renal disease - Primary    Has been taking his medicine. Under good control. Call with any concerns. Continue current regimen.       Relevant Orders   Comprehensive metabolic panel     Other   Clinical depression    No side effects on the prozac. Tolerating it well. Recheck 2-3 weeks to adjust dose as needed.       History of malignant neoplasm of prostate    Will recheck PSA- ordered today. Will send copy to urologist at [facility]- will call with the name of the urologist.       Relevant Orders   PSA   Hyperlipidemia    Has been taking his medicine. Under good control. Call with any concerns. Continue current regimen.       Relevant Orders   Comprehensive metabolic panel   Lipid Panel w/o  Chol/HDL Ratio       Follow up plan: Return 2-4 weeks, for follow up mood.

## 2017-09-21 NOTE — Assessment & Plan Note (Signed)
Has been taking his medicine. Under good control. Call with any concerns. Continue current regimen.

## 2017-09-21 NOTE — Assessment & Plan Note (Signed)
No side effects on the prozac. Tolerating it well. Recheck 2-3 weeks to adjust dose as needed.

## 2017-09-22 LAB — LIPID PANEL W/O CHOL/HDL RATIO
CHOLESTEROL TOTAL: 212 mg/dL — AB (ref 100–199)
HDL: 46 mg/dL (ref >39)
LDL CALC: 126 mg/dL — AB (ref 0–99)
TRIGLYCERIDES: 199 mg/dL — AB (ref 0–149)
VLDL CHOLESTEROL CAL: 40 mg/dL (ref 5–40)

## 2017-09-22 LAB — PSA

## 2017-09-22 LAB — COMPREHENSIVE METABOLIC PANEL
ALK PHOS: 98 IU/L (ref 39–117)
ALT: 23 IU/L (ref 0–44)
AST: 24 IU/L (ref 0–40)
Albumin/Globulin Ratio: 1.9 (ref 1.2–2.2)
Albumin: 4.7 g/dL (ref 3.5–4.8)
BUN/Creatinine Ratio: 23 (ref 10–24)
BUN: 25 mg/dL (ref 8–27)
Bilirubin Total: 0.7 mg/dL (ref 0.0–1.2)
CO2: 24 mmol/L (ref 20–29)
CREATININE: 1.1 mg/dL (ref 0.76–1.27)
Calcium: 10 mg/dL (ref 8.6–10.2)
Chloride: 101 mmol/L (ref 96–106)
GFR calc Af Amer: 77 mL/min/{1.73_m2} (ref >59)
GFR, EST NON AFRICAN AMERICAN: 66 mL/min/{1.73_m2} (ref >59)
GLOBULIN, TOTAL: 2.5 g/dL (ref 1.5–4.5)
Glucose: 88 mg/dL (ref 65–99)
Potassium: 4.7 mmol/L (ref 3.5–5.2)
SODIUM: 141 mmol/L (ref 134–144)
Total Protein: 7.2 g/dL (ref 6.0–8.5)

## 2017-09-24 ENCOUNTER — Encounter: Payer: Self-pay | Admitting: Family Medicine

## 2017-10-24 ENCOUNTER — Encounter: Payer: Self-pay | Admitting: Family Medicine

## 2017-10-24 ENCOUNTER — Ambulatory Visit (INDEPENDENT_AMBULATORY_CARE_PROVIDER_SITE_OTHER): Payer: Medicare Other | Admitting: Family Medicine

## 2017-10-24 VITALS — BP 151/76 | HR 53 | Temp 97.6°F | Wt 182.6 lb

## 2017-10-24 DIAGNOSIS — F331 Major depressive disorder, recurrent, moderate: Secondary | ICD-10-CM

## 2017-10-24 MED ORDER — FLUOXETINE HCL 40 MG PO CAPS: 40.0000 mg | ORAL_CAPSULE | Freq: Every day | ORAL | 3 refills | Status: DC

## 2017-10-24 NOTE — Progress Notes (Signed)
BP (!) 151/76 (BP Location: Left Arm, Patient Position: Sitting, Cuff Size: Normal)   Pulse (!) 53   Temp 97.6 F (36.4 C) (Oral)   Wt 182 lb 9.6 oz (82.8 kg)   SpO2 98%   BMI 28.94 kg/m    Subjective:    Patient ID: Zachary Hansen, male    DOB: Apr 05, 1941, 77 y.o.   MRN: [id number]  HPI: KIREN Hansen is a 77 y.o. male  Chief Complaint  Patient presents with  . Depression   DEPRESSION Mood status: better Satisfied with current treatment?: no Symptom severity: moderate  Duration of current treatment : months Side effects: no Medication compliance: excellent compliance Psychotherapy/counseling: no  Previous psychiatric medications: prozac Depressed mood: yes Anxious mood: yes Anhedonia: no Significant weight loss or gain: no Insomnia: no  Fatigue: yes Feelings of worthlessness or guilt: yes Impaired concentration/indecisiveness: yes Suicidal ideations: no Hopelessness: no Crying spells: no Depression screen [facility] 2/9 10/24/2017 09/07/2017 01/24/2017 07/24/2016 12/20/2015  Decreased Interest 3 3 0 0 1  Down, Depressed, Hopeless 1 3 0 0 1  PHQ - 2 Score 4 6 0 0 2  Altered sleeping 3 3 - - -  Tired, decreased energy 3 3 - - -  Change in appetite 3 3 - - -  Feeling bad or failure about yourself  0 1 - - -  Trouble concentrating 1 1 - - -  Moving slowly or fidgety/restless 0 0 - - -  Suicidal thoughts 0 0 - - -  PHQ-9 Score 14 17 - - -  Difficult doing work/chores Very difficult Very difficult - - -    Relevant past medical, surgical, family and social history reviewed and updated as indicated. Interim medical history since our last visit reviewed. Allergies and medications reviewed and updated.  Review of Systems  Constitutional: Negative.   Respiratory: Negative.   Cardiovascular: Negative.   Psychiatric/Behavioral: Positive for dysphoric mood. Negative for agitation, behavioral problems, confusion, decreased concentration, hallucinations, self-injury,  sleep disturbance and suicidal ideas. The patient is not nervous/anxious and is not hyperactive.     Per HPI unless specifically indicated above     Objective:    BP (!) 151/76 (BP Location: Left Arm, Patient Position: Sitting, Cuff Size: Normal)   Pulse (!) 53   Temp 97.6 F (36.4 C) (Oral)   Wt 182 lb 9.6 oz (82.8 kg)   SpO2 98%   BMI 28.94 kg/m   Wt Readings from Last 3 Encounters:  10/24/17 182 lb 9.6 oz (82.8 kg)  09/21/17 181 lb 5 oz (82.2 kg)  09/07/17 182 lb 8 oz (82.8 kg)    Physical Exam  Constitutional: He is oriented to person, place, and time. He appears well-developed and well-nourished. No distress.  HENT:  Head: Normocephalic and atraumatic.  Right Ear: Hearing normal.  Left Ear: Hearing normal.  Nose: Nose normal.  Eyes: Conjunctivae and lids are normal. Right eye exhibits no discharge. Left eye exhibits no discharge. No scleral icterus.  Cardiovascular: Normal rate, regular rhythm and intact distal pulses. Exam reveals no gallop and no friction rub.  Murmur heard. Pulmonary/Chest: Effort normal and breath sounds normal. No respiratory distress. He has no wheezes. He has no rales. He exhibits no tenderness.  Musculoskeletal: Normal range of motion.  Neurological: He is alert and oriented to person, place, and time.  Skin: Skin is warm, dry and intact. No rash noted. He is not diaphoretic. No erythema. No pallor.  Psychiatric: He has a normal  mood and affect. His speech is normal and behavior is normal. Judgment and thought content normal. Cognition and memory are normal.  Nursing note and vitals reviewed.   Results for orders placed or performed in visit on 09/21/17  Comprehensive metabolic panel  Result Value Ref Range   Glucose 88 65 - 99 mg/dL   BUN 25 8 - 27 mg/dL   Creatinine, Ser 1.10 0.76 - 1.27 mg/dL   GFR calc non Af Amer 66 >59 mL/min/1.73   GFR calc Af Amer 77 >59 mL/min/1.73   BUN/Creatinine Ratio 23 10 - 24   Sodium 141 134 - 144 mmol/L    Potassium 4.7 3.5 - 5.2 mmol/L   Chloride 101 96 - 106 mmol/L   CO2 24 20 - 29 mmol/L   Calcium 10.0 8.6 - 10.2 mg/dL   Total Protein 7.2 6.0 - 8.5 g/dL   Albumin 4.7 3.5 - 4.8 g/dL   Globulin, Total 2.5 1.5 - 4.5 g/dL   Albumin/Globulin Ratio 1.9 1.2 - 2.2   Bilirubin Total 0.7 0.0 - 1.2 mg/dL   Alkaline Phosphatase 98 39 - 117 IU/L   AST 24 0 - 40 IU/L   ALT 23 0 - 44 IU/L  Lipid Panel w/o Chol/HDL Ratio  Result Value Ref Range   Cholesterol, Total 212 (H) 100 - 199 mg/dL   Triglycerides 199 (H) 0 - 149 mg/dL   HDL 46 >39 mg/dL   VLDL Cholesterol Cal 40 5 - 40 mg/dL   LDL Calculated 126 (H) 0 - 99 mg/dL  PSA  Result Value Ref Range   Prostate Specific Ag, Serum <0.1 0.0 - 4.0 ng/mL      Assessment & Plan:   Problem List Items Addressed This Visit      Other   Clinical depression - Primary    Better, but still not there. Will increase his prozac to 40mg  daily and recheck 1 month. Call with any concerns.       Relevant Medications   FLUoxetine (PROZAC) 40 MG capsule       Follow up plan: Return in about 4 weeks (around 11/21/2017) for follow up mood.

## 2017-10-24 NOTE — Assessment & Plan Note (Signed)
Better, but still not there. Will increase his prozac to 40mg  daily and recheck 1 month. Call with any concerns.

## 2017-11-22 ENCOUNTER — Ambulatory Visit (INDEPENDENT_AMBULATORY_CARE_PROVIDER_SITE_OTHER): Payer: Medicare Other | Admitting: Family Medicine

## 2017-11-22 ENCOUNTER — Encounter: Payer: Self-pay | Admitting: Family Medicine

## 2017-11-22 DIAGNOSIS — F331 Major depressive disorder, recurrent, moderate: Secondary | ICD-10-CM

## 2017-11-22 NOTE — Assessment & Plan Note (Signed)
Doing much better on current regimen. Continue to monitor. Call with any concerns. Recheck 6 months.

## 2017-11-22 NOTE — Progress Notes (Signed)
BP 134/72 (BP Location: Left Arm)   Pulse 62   Temp 97.6 F (36.4 C)   Wt 181 lb 2 oz (82.2 kg)   SpO2 98%   BMI 28.71 kg/m    Subjective:    Patient ID: Zachary Hansen, male    DOB: March 05, 1941, 77 y.o.   MRN: [id number]  HPI: Zachary Hansen is a 77 y.o. male  Chief Complaint  Patient presents with  . Depression   DEPRESSION Mood status: better Satisfied with current treatment?: yes Symptom severity: moderate  Duration of current treatment : chronic Side effects: no Medication compliance: excellent compliance Psychotherapy/counseling: no  Previous psychiatric medications: prozac Depressed mood: yes Anxious mood: no Anhedonia: no Significant weight loss or gain: no Insomnia: no  Fatigue: yes Feelings of worthlessness or guilt: yes Impaired concentration/indecisiveness: no Suicidal ideations: no Hopelessness: no Crying spells: no Depression screen [facility] 2/9 11/22/2017 10/24/2017 09/07/2017 01/24/2017 07/24/2016  Decreased Interest 1 3 3  0 0  Down, Depressed, Hopeless 1 1 3  0 0  PHQ - 2 Score 2 4 6  0 0  Altered sleeping 2 3 3  - -  Tired, decreased energy 2 3 3  - -  Change in appetite 1 3 3  - -  Feeling bad or failure about yourself  0 0 1 - -  Trouble concentrating 0 1 1 - -  Moving slowly or fidgety/restless 0 0 0 - -  Suicidal thoughts 0 0 0 - -  PHQ-9 Score 7 14 17  - -  Difficult doing work/chores Somewhat difficult Very difficult Very difficult - -     Relevant past medical, surgical, family and social history reviewed and updated as indicated. Interim medical history since our last visit reviewed. Allergies and medications reviewed and updated.  Review of Systems  Constitutional: Negative.   Respiratory: Negative.   Cardiovascular: Negative.   Gastrointestinal: Negative.   Psychiatric/Behavioral: Positive for dysphoric mood and sleep disturbance. Negative for agitation, behavioral problems, confusion, decreased concentration, hallucinations,  self-injury and suicidal ideas. The patient is not nervous/anxious and is not hyperactive.     Per HPI unless specifically indicated above     Objective:    BP 134/72 (BP Location: Left Arm)   Pulse 62   Temp 97.6 F (36.4 C)   Wt 181 lb 2 oz (82.2 kg)   SpO2 98%   BMI 28.71 kg/m   Wt Readings from Last 3 Encounters:  11/22/17 181 lb 2 oz (82.2 kg)  10/24/17 182 lb 9.6 oz (82.8 kg)  09/21/17 181 lb 5 oz (82.2 kg)    Physical Exam  Constitutional: He is oriented to person, place, and time. He appears well-developed and well-nourished. No distress.  HENT:  Head: Normocephalic and atraumatic.  Right Ear: Hearing normal.  Left Ear: Hearing normal.  Nose: Nose normal.  Eyes: Conjunctivae and lids are normal. Right eye exhibits no discharge. Left eye exhibits no discharge. No scleral icterus.  Cardiovascular: Normal rate, regular rhythm, normal heart sounds and intact distal pulses. Exam reveals no gallop and no friction rub.  No murmur heard. Pulmonary/Chest: Effort normal and breath sounds normal. No respiratory distress. He has no wheezes. He has no rales. He exhibits no tenderness.  Musculoskeletal: Normal range of motion.  Neurological: He is alert and oriented to person, place, and time.  Skin: Skin is warm, dry and intact. No rash noted. He is not diaphoretic. No erythema. No pallor.  Psychiatric: He has a normal mood and affect. His speech is normal  and behavior is normal. Judgment and thought content normal. Cognition and memory are normal.  Nursing note and vitals reviewed.   Results for orders placed or performed in visit on 09/21/17  Comprehensive metabolic panel  Result Value Ref Range   Glucose 88 65 - 99 mg/dL   BUN 25 8 - 27 mg/dL   Creatinine, Ser 1.10 0.76 - 1.27 mg/dL   GFR calc non Af Amer 66 >59 mL/min/1.73   GFR calc Af Amer 77 >59 mL/min/1.73   BUN/Creatinine Ratio 23 10 - 24   Sodium 141 134 - 144 mmol/L   Potassium 4.7 3.5 - 5.2 mmol/L   Chloride  101 96 - 106 mmol/L   CO2 24 20 - 29 mmol/L   Calcium 10.0 8.6 - 10.2 mg/dL   Total Protein 7.2 6.0 - 8.5 g/dL   Albumin 4.7 3.5 - 4.8 g/dL   Globulin, Total 2.5 1.5 - 4.5 g/dL   Albumin/Globulin Ratio 1.9 1.2 - 2.2   Bilirubin Total 0.7 0.0 - 1.2 mg/dL   Alkaline Phosphatase 98 39 - 117 IU/L   AST 24 0 - 40 IU/L   ALT 23 0 - 44 IU/L  Lipid Panel w/o Chol/HDL Ratio  Result Value Ref Range   Cholesterol, Total 212 (H) 100 - 199 mg/dL   Triglycerides 199 (H) 0 - 149 mg/dL   HDL 46 >39 mg/dL   VLDL Cholesterol Cal 40 5 - 40 mg/dL   LDL Calculated 126 (H) 0 - 99 mg/dL  PSA  Result Value Ref Range   Prostate Specific Ag, Serum <0.1 0.0 - 4.0 ng/mL      Assessment & Plan:   Problem List Items Addressed This Visit      Other   Clinical depression    Doing much better on current regimen. Continue to monitor. Call with any concerns. Recheck 6 months.           Follow up plan: Return in about 6 months (around 05/22/2018) for Wellness/physical.

## 2017-11-30 LAB — FECAL OCCULT BLOOD, GUAIAC: FECAL OCCULT BLD: NEGATIVE

## 2018-01-23 ENCOUNTER — Other Ambulatory Visit: Payer: Self-pay | Admitting: Family Medicine

## 2018-03-08 ENCOUNTER — Encounter: Payer: Self-pay | Admitting: Family Medicine

## 2018-05-22 ENCOUNTER — Encounter: Payer: Medicare Other | Admitting: Family Medicine

## 2018-05-28 ENCOUNTER — Other Ambulatory Visit: Payer: Self-pay

## 2018-05-28 ENCOUNTER — Encounter: Payer: Self-pay | Admitting: Family Medicine

## 2018-05-28 ENCOUNTER — Ambulatory Visit (INDEPENDENT_AMBULATORY_CARE_PROVIDER_SITE_OTHER): Payer: Medicare Other | Admitting: Family Medicine

## 2018-05-28 VITALS — BP 166/92 | HR 61 | Temp 98.8°F | Ht 67.1 in | Wt 180.6 lb

## 2018-05-28 DIAGNOSIS — Z125 Encounter for screening for malignant neoplasm of prostate: Secondary | ICD-10-CM

## 2018-05-28 DIAGNOSIS — G4733 Obstructive sleep apnea (adult) (pediatric): Secondary | ICD-10-CM | POA: Diagnosis not present

## 2018-05-28 DIAGNOSIS — Z0000 Encounter for general adult medical examination without abnormal findings: Secondary | ICD-10-CM

## 2018-05-28 DIAGNOSIS — E291 Testicular hypofunction: Secondary | ICD-10-CM

## 2018-05-28 DIAGNOSIS — N200 Calculus of kidney: Secondary | ICD-10-CM

## 2018-05-28 DIAGNOSIS — Z8546 Personal history of malignant neoplasm of prostate: Secondary | ICD-10-CM

## 2018-05-28 DIAGNOSIS — E782 Mixed hyperlipidemia: Secondary | ICD-10-CM

## 2018-05-28 DIAGNOSIS — S41101A Unspecified open wound of right upper arm, initial encounter: Secondary | ICD-10-CM

## 2018-05-28 DIAGNOSIS — Z23 Encounter for immunization: Secondary | ICD-10-CM

## 2018-05-28 DIAGNOSIS — R42 Dizziness and giddiness: Secondary | ICD-10-CM

## 2018-05-28 DIAGNOSIS — F331 Major depressive disorder, recurrent, moderate: Secondary | ICD-10-CM

## 2018-05-28 DIAGNOSIS — I129 Hypertensive chronic kidney disease with stage 1 through stage 4 chronic kidney disease, or unspecified chronic kidney disease: Secondary | ICD-10-CM

## 2018-05-28 LAB — UA/M W/RFLX CULTURE, ROUTINE
Bilirubin, UA: NEGATIVE
GLUCOSE, UA: NEGATIVE
KETONES UA: NEGATIVE
LEUKOCYTES UA: NEGATIVE
Nitrite, UA: NEGATIVE
PROTEIN UA: NEGATIVE
RBC UA: NEGATIVE
SPEC GRAV UA: 1.015 (ref 1.005–1.030)
Urobilinogen, Ur: 0.2 mg/dL (ref 0.2–1.0)
pH, UA: 7 (ref 5.0–7.5)

## 2018-05-28 LAB — MICROALBUMIN, URINE WAIVED
Creatinine, Urine Waived: 100 mg/dL (ref 10–300)
Microalb, Ur Waived: 10 mg/L (ref 0–19)
Microalb/Creat Ratio: <30 mg/g (ref <30)

## 2018-05-28 MED ORDER — FLUOXETINE HCL 40 MG PO CAPS: 40.0000 mg | ORAL_CAPSULE | Freq: Every day | ORAL | 3 refills | Status: DC

## 2018-05-28 MED ORDER — VALSARTAN 40 MG PO TABS: 80.0000 mg | ORAL_TABLET | Freq: Every day | ORAL | 1 refills | Status: DC

## 2018-05-28 MED ORDER — NAPROXEN 500 MG PO TABS: 500.0000 mg | ORAL_TABLET | Freq: Two times a day (BID) | ORAL | 1 refills | Status: DC | PRN

## 2018-05-28 MED ORDER — CYCLOBENZAPRINE HCL 10 MG PO TABS: 10.0000 mg | ORAL_TABLET | Freq: Every day | ORAL | 1 refills | Status: DC

## 2018-05-28 NOTE — Assessment & Plan Note (Signed)
Followed by urology at [facility]. Call with any concerns.

## 2018-05-28 NOTE — Progress Notes (Signed)
BP (!) 166/92   Pulse 61   Temp 98.8 F (37.1 C) (Oral)   Ht 5' 7.1" (1.704 m)   Wt 180 lb 9.6 oz (81.9 kg)   SpO2 97%   BMI 28.20 kg/m    Subjective:    Patient ID: Zachary Hansen, male    DOB: 03/21/1941, 77 y.o.   MRN: [id number]  HPI: Zachary Hansen is a 77 y.o. male presenting on 05/28/2018 for comprehensive medical examination. Current medical complaints include:  HYPERTENSION / HYPERLIPIDEMIA Satisfied with current treatment? yes Duration of hypertension: chronic BP monitoring frequency: not checking BP medication side effects: no Past BP meds: valsartan Duration of hyperlipidemia: chronic Cholesterol medication side effects: Not on anything Cholesterol supplements: none Past cholesterol medications: none Medication compliance: excellent compliance Aspirin: yes Recent stressors: no Recurrent headaches: no Visual changes: no Palpitations: no Dyspnea: no Chest pain: no Lower extremity edema: no Dizzy/lightheaded: yes- room spinning  LOW TESTOSTERONE Duration: chronic Status: stable  Satisfied with current treatment:  yes Medication side effects:  no Medication compliance: excellent compliance Decreased libido: no Fatigue: no Depressed mood: no Muscle weakness: no Erectile dysfunction: no  DEPRESSION Mood status: controlled Satisfied with current treatment?: yes Symptom severity: mild  Duration of current treatment : chronic Side effects: no Medication compliance: excellent compliance Psychotherapy/counseling: no  Previous psychiatric medications: prozac Depressed mood: no Anxious mood: no Anhedonia: no Significant weight loss or gain: no Insomnia: no  Fatigue: no Feelings of worthlessness or guilt: no Impaired concentration/indecisiveness: no Suicidal ideations: no Hopelessness: no Crying spells: no Depression screen [facility] 2/9 05/28/2018 11/22/2017 10/24/2017 09/07/2017 01/24/2017  Decreased Interest 1 1 3 3  0  Down, Depressed, Hopeless 1 1  1 3  0  PHQ - 2 Score 2 2 4 6  0  Altered sleeping 3 2 3 3  -  Tired, decreased energy 3 2 3 3  -  Change in appetite 0 1 3 3  -  Feeling bad or failure about yourself  0 0 0 1 -  Trouble concentrating 0 0 1 1 -  Moving slowly or fidgety/restless 0 0 0 0 -  Suicidal thoughts 0 0 0 0 -  PHQ-9 Score 8 7 14 17  -  Difficult doing work/chores Not difficult at all Somewhat difficult Very difficult Very difficult -   GAD 7 : Generalized Anxiety Score 05/28/2018  Nervous, Anxious, on Edge 1  Control/stop worrying 0  Worry too much - different things 1  Trouble relaxing 0  Restless 0  Easily annoyed or irritable 1  Afraid - awful might happen 0  Total GAD 7 Score 3  Anxiety Difficulty Somewhat difficult    He currently lives with: nephew Interim Problems from his last visit: no  Functional Status Survey: Is the patient deaf or have difficulty hearing?: Yes Does the patient have difficulty seeing, even when wearing glasses/contacts?: No Does the patient have difficulty concentrating, remembering, or making decisions?: Yes Does the patient have difficulty walking or climbing stairs?: No Does the patient have difficulty dressing or bathing?: No Does the patient have difficulty doing errands alone such as visiting a doctor's office or shopping?: No  FALL RISK: Fall Risk  05/28/2018 01/24/2017 12/06/2015  Falls in the past year? No No No    Depression Screen Depression screen [facility] 2/9 05/28/2018 11/22/2017 10/24/2017 09/07/2017 01/24/2017  Decreased Interest 1 1 3 3  0  Down, Depressed, Hopeless 1 1 1 3  0  PHQ - 2 Score 2 2 4 6  0  Altered sleeping 3  2 3 3  -  Tired, decreased energy 3 2 3 3  -  Change in appetite 0 1 3 3  -  Feeling bad or failure about yourself  0 0 0 1 -  Trouble concentrating 0 0 1 1 -  Moving slowly or fidgety/restless 0 0 0 0 -  Suicidal thoughts 0 0 0 0 -  PHQ-9 Score 8 7 14 17  -  Difficult doing work/chores Not difficult at all Somewhat difficult Very difficult Very  difficult -    Past Medical History:  Past Medical History:  Diagnosis Date  . ADHD (attention deficit hyperactivity disorder)   . Depression   . Prostate cancer ([place])   . Sleep apnea     Surgical History:  Past Surgical History:  Procedure Laterality Date  . NASAL SEPTUM SURGERY    . prostate cancer surgery    . THROAT SURGERY     Due to snoring    Medications:  Current Outpatient Medications on File Prior to Visit  Medication Sig  . aspirin EC 81 MG tablet Take 81 mg by mouth.  . Cyanocobalamin (VITAMIN B 12 PO) Take 1,000 mcg by mouth daily.  . Misc Natural Products (OSTEO BI-FLEX JOINT SHIELD PO) Take by mouth.  . Multiple Vitamin (MULTI-VITAMINS) TABS Take by mouth.   No current facility-administered medications on file prior to visit.     Allergies:  Allergies  Allergen Reactions  . Lisinopril Cough    Social History:  Social History   Socioeconomic History  . Marital status: Married    Spouse name: Not on file  . Number of children: Not on file  . Years of education: Not on file  . Highest education level: Not on file  Occupational History  . Not on file  Social Needs  . Financial resource strain: Not on file  . Food insecurity:    Worry: Not on file    Inability: Not on file  . Transportation needs:    Medical: Not on file    Non-medical: Not on file  Tobacco Use  . Smoking status: Never Smoker  . Smokeless tobacco: Never Used  Substance and Sexual Activity  . Alcohol use: Yes    Alcohol/week: 23.0 standard drinks    Types: 21 Cans of beer, 2 Shots of liquor per week  . Drug use: No  . Sexual activity: Never  Lifestyle  . Physical activity:    Days per week: Not on file    Minutes per session: Not on file  . Stress: Not on file  Relationships  . Social connections:    Talks on phone: Not on file    Gets together: Not on file    Attends religious service: Not on file    Active member of club or organization: Not on file    Attends  meetings of clubs or organizations: Not on file    Relationship status: Not on file  . Intimate partner violence:    Fear of current or ex partner: Not on file    Emotionally abused: Not on file    Physically abused: Not on file    Forced sexual activity: Not on file  Other Topics Concern  . Not on file  Social History Narrative  . Not on file   Social History   Tobacco Use  Smoking Status Never Smoker  Smokeless Tobacco Never Used   Social History   Substance and Sexual Activity  Alcohol Use Yes  . Alcohol/week: 23.0 standard drinks  .  Types: 21 Cans of beer, 2 Shots of liquor per week    Family History:  Family History  Problem Relation Age of Onset  . Arthritis Mother   . Heart disease Father   . Cancer Father   . Heart disease Sister   . Cancer Sister   . Sleep apnea Brother   . Alzheimer's disease Maternal Grandfather   . Heart disease Paternal Grandmother   . Diverticulitis Sister   . Sleep apnea Sister     Past medical history, surgical history, medications, allergies, family history and social history reviewed with patient today and changes made to appropriate areas of the chart.   Review of Systems  Constitutional: Positive for malaise/fatigue. Negative for chills, diaphoresis, fever and weight loss.  HENT: Negative.   Eyes: Positive for blurred vision (needs to go to the eye doctor). Negative for double vision, photophobia, pain, discharge and redness.  Respiratory: Positive for shortness of breath. Negative for cough, hemoptysis, sputum production and wheezing.   Cardiovascular: Negative.   Gastrointestinal: Positive for diarrhea and heartburn. Negative for abdominal pain, blood in stool, constipation, melena, nausea and vomiting.  Genitourinary: Negative.   Musculoskeletal: Negative.   Skin: Negative.   Neurological: Positive for dizziness. Negative for tingling, tremors, sensory change, speech change, focal weakness, seizures, loss of consciousness,  weakness and headaches.  Endo/Heme/Allergies: Positive for environmental allergies. Negative for polydipsia. Does not bruise/bleed easily.  Psychiatric/Behavioral: Negative.     All other ROS negative except what is listed above and in the HPI.      Objective:    BP (!) 166/92   Pulse 61   Temp 98.8 F (37.1 C) (Oral)   Ht 5' 7.1" (1.704 m)   Wt 180 lb 9.6 oz (81.9 kg)   SpO2 97%   BMI 28.20 kg/m   Wt Readings from Last 3 Encounters:  05/28/18 180 lb 9.6 oz (81.9 kg)  11/22/17 181 lb 2 oz (82.2 kg)  10/24/17 182 lb 9.6 oz (82.8 kg)   Orthostatic VS for the past 24 hrs:  BP- Lying Pulse- Lying BP- Sitting Pulse- Sitting BP- Standing at 0 minutes Pulse- Standing at 0 minutes  05/28/18 1345 164/88 58 153/82 55 157/77 63     Physical Exam  Constitutional: He is oriented to person, place, and time. He appears well-developed and well-nourished. No distress.  HENT:  Head: Normocephalic and atraumatic.  Right Ear: Hearing, tympanic membrane, external ear and ear canal normal.  Left Ear: Hearing, tympanic membrane, external ear and ear canal normal.  Nose: Nose normal.  Mouth/Throat: Uvula is midline, oropharynx is clear and moist and mucous membranes are normal. No oropharyngeal exudate.  Eyes: Pupils are equal, round, and reactive to light. Conjunctivae, EOM and lids are normal. Right eye exhibits no discharge. Left eye exhibits no discharge. No scleral icterus.  Neck: Normal range of motion. Neck supple. No JVD present. No tracheal deviation present. No thyromegaly present.  Cardiovascular: Normal rate, regular rhythm, normal heart sounds and intact distal pulses. Exam reveals no gallop and no friction rub.  No murmur heard. Pulmonary/Chest: Effort normal and breath sounds normal. No stridor. No respiratory distress. He has no wheezes. He has no rales. He exhibits no tenderness.  Abdominal: Soft. Bowel sounds are normal. He exhibits no distension and no mass. There is no  tenderness. There is no rebound and no guarding. No hernia.  Musculoskeletal: Normal range of motion. He exhibits no edema, tenderness or deformity.  Lymphadenopathy:    He has  no cervical adenopathy.  Neurological: He is alert and oriented to person, place, and time. He displays normal reflexes. No cranial nerve deficit or sensory deficit. He exhibits normal muscle tone. Coordination normal.  Skin: Skin is warm, dry and intact. Capillary refill takes less than 2 seconds. No rash noted. He is not diaphoretic. No erythema. No pallor.  Well healing wound on R arm  Psychiatric: He has a normal mood and affect. His speech is normal and behavior is normal. Judgment and thought content normal. Cognition and memory are normal.  Nursing note and vitals reviewed.   6CIT Screen 05/28/2018 01/24/2017  What Year? 0 points 0 points  What month? 0 points 0 points  What time? 0 points 0 points  Count back from 20 0 points 0 points  Months in reverse 0 points 2 points  Repeat phrase 0 points 0 points  Total Score 0 2    Results for orders placed or performed in visit on 05/28/18  Fecal Occult Blood, Guaiac  Result Value Ref Range   Fecal Occult Blood Negative       Assessment & Plan:   Problem List Items Addressed This Visit      Respiratory   Sleep apnea    Under good control. Continue CPAP. Call with any concerns.       Relevant Orders   CBC with Differential/Platelet   Comprehensive metabolic panel   TSH     Endocrine   Hypogonadism in male    Followed by urology at [facility]. Call with any concerns.       Relevant Orders   CBC with Differential/Platelet   Comprehensive metabolic panel   TSH     Genitourinary   Kidney stone    Rechecking urine today. Call with any concerns. Continue to monitor.       Relevant Orders   CBC with Differential/Platelet   Comprehensive metabolic panel   TSH   UA/M w/rflx Culture, Routine   Benign hypertensive renal disease    Slightly elevated  today. Work on [employer]. Continue medication. Call with any concerns. Refills given.       Relevant Orders   CBC with Differential/Platelet   Comprehensive metabolic panel   TSH   Microalbumin, Urine Waived     Other   Clinical depression    Under good control on current regimen. Continue current regimen. Continue to monitor. Call with any concerns.       Relevant Medications   FLUoxetine (PROZAC) 40 MG capsule   Other Relevant Orders   CBC with Differential/Platelet   Comprehensive metabolic panel   TSH   History of malignant neoplasm of prostate    PSA was up a bit at [facility]- rechecking it today. Await results. Call with any concerns.       Hyperlipidemia    Under good control on current regimen. Continue current regimen. Continue to monitor. Call with any concerns. Refills given.        Relevant Medications   valsartan (DIOVAN) 40 MG tablet   Other Relevant Orders   CBC with Differential/Platelet   Comprehensive metabolic panel   Lipid Panel w/o Chol/HDL Ratio   TSH    Other Visit Diagnoses    Medicare annual wellness visit, subsequent    -  Primary   Preventative care discussed as below. Call with any concerns.    Routine general medical examination at a health care facility       Vaccines up to date. Screening labs checked  today. FOBT done at [place]. Continue diet and exercise. Call with any concerns.    Screening for prostate cancer       PSA drawn today, await results.    Relevant Orders   PSA   Open wound of right upper arm, initial encounter       Healing well. Td due- given today.   Relevant Orders   Td : Tetanus/diphtheria >7yo Preservative  free (Completed)   Immunization due       Prevnar given today.   Relevant Orders   Pneumococcal conjugate vaccine 13-valent (Completed)   Vertigo           Preventative Services:  Health Risk Assessment and Personalized Prevention Plan: Done today Bone Mass Measurements: N/A CVD Screening: Done today Colon Cancer  Screening: Up to date Depression Screening: Done today Diabetes Screening: Done today Glaucoma Screening: See your eye doctor Hepatitis B vaccine: N/A Hepatitis C screening: N/A HIV Screening: Up to date Flu Vaccine: Declined Lung cancer Screening: N/A Obesity Screening: Done today Pneumonia Vaccines (2): Given today STI Screening: N/A PSA screening: Checked today.  LABORATORY TESTING:  Health maintenance labs ordered today as discussed above.   The natural history of prostate cancer and ongoing controversy regarding screening and potential treatment outcomes of prostate cancer has been discussed with the patient. The meaning of a false positive PSA and a false negative PSA has been discussed. He indicates understanding of the limitations of this screening test and wishes to proceed with screening PSA testing.   IMMUNIZATIONS:   - Tdap: Tetanus vaccination status reviewed: Td vaccination indicated and given today. - Influenza: Postponed to flu season - Pneumovax: Up to date - Prevnar: Administered today - Zostavax vaccine: Up to date  SCREENING: - Colonoscopy: Done through the [place]  Discussed with patient purpose of the colonoscopy is to detect colon cancer at curable precancerous or early stages   PATIENT COUNSELING:    Sexuality: Discussed sexually transmitted diseases, partner selection, use of condoms, avoidance of unintended pregnancy  and contraceptive alternatives.   Advised to avoid cigarette smoking.  I discussed with the patient that most people either abstain from alcohol or drink within safe limits (<=14/week and <=4 drinks/occasion for males, <=7/weeks and <= 3 drinks/occasion for females) and that the risk for alcohol disorders and other health effects rises proportionally with the number of drinks per week and how often a drinker exceeds daily limits.  Discussed cessation/primary prevention of drug use and availability of treatment for abuse.   Diet: Encouraged  to adjust caloric intake to maintain  or achieve ideal body weight, to reduce intake of dietary saturated fat and total fat, to limit sodium intake by avoiding high sodium foods and not adding table salt, and to maintain adequate dietary potassium and calcium preferably from fresh fruits, vegetables, and low-fat dairy products.    stressed the importance of regular exercise  Injury prevention: Discussed safety belts, safety helmets, smoke detector, smoking near bedding or upholstery.   Dental health: Discussed importance of regular tooth brushing, flossing, and dental visits.   Follow up plan: NEXT PREVENTATIVE PHYSICAL DUE IN 1 YEAR. Return in about 6 months (around 11/28/2018) for Follow up.

## 2018-05-28 NOTE — Assessment & Plan Note (Signed)
Under good control on current regimen. Continue current regimen. Continue to monitor. Call with any concerns. Refills given.   

## 2018-05-28 NOTE — Assessment & Plan Note (Signed)
Rechecking urine today. Call with any concerns. Continue to monitor.

## 2018-05-28 NOTE — Assessment & Plan Note (Signed)
Under good control on current regimen. Continue current regimen. Continue to monitor. Call with any concerns.   

## 2018-05-28 NOTE — Assessment & Plan Note (Signed)
PSA was up a bit at [facility]- rechecking it today. Await results. Call with any concerns.

## 2018-05-28 NOTE — Assessment & Plan Note (Signed)
Slightly elevated today. Work on [employer]. Continue medication. Call with any concerns. Refills given.

## 2018-05-28 NOTE — Patient Instructions (Addendum)
Preventative Services:  Health Risk Assessment and Personalized Prevention Plan: Done today Bone Mass Measurements: N/A CVD Screening: Done today Colon Cancer Screening: Up to date Depression Screening: Done today Diabetes Screening: Done today Glaucoma Screening: See your eye doctor Hepatitis B vaccine: N/A Hepatitis C screening: N/A HIV Screening: Up to date Flu Vaccine: Declined Lung cancer Screening: N/A Obesity Screening: Done today Pneumonia Vaccines (2): Given today STI Screening: N/A PSA screening: Checked today.   Health Maintenance, Male A healthy lifestyle and preventive care is important for your health and wellness. Ask your health care provider about what schedule of regular examinations is right for you. What should I know about weight and diet? Eat a Healthy Diet  Eat plenty of vegetables, fruits, whole grains, low-fat dairy products, and lean protein.  Do not eat a lot of foods high in solid fats, added sugars, or salt.  Maintain a Healthy Weight Regular exercise can help you achieve or maintain a healthy weight. You should:  Do at least 150 minutes of exercise each week. The exercise should increase your heart rate and make you sweat (moderate-intensity exercise).  Do strength-training exercises at least twice a week.  Watch Your Levels of Cholesterol and Blood Lipids  Have your blood tested for lipids and cholesterol every 5 years starting at 75 years of age. If you are at high risk for heart disease, you should start having your blood tested when you are 75 years old. You may need to have your cholesterol levels checked more often if: ? Your lipid or cholesterol levels are high. ? You are older than 75 years of age. ? You are at high risk for heart disease.  What should I know about cancer screening? Many types of cancers can be detected early and may often be prevented. Lung Cancer  You should be screened every year for lung cancer if: ? You are a  current smoker who has smoked for at least 30 years. ? You are a former smoker who has quit within the past 15 years.  Talk to your health care provider about your screening options, when you should start screening, and how often you should be screened.  Colorectal Cancer  Routine colorectal cancer screening usually begins at 75 years of age and should be repeated every 5-10 years until you are 75 years old. You may need to be screened more often if early forms of precancerous polyps or small growths are found. Your health care provider may recommend screening at an earlier age if you have risk factors for colon cancer.  Your health care provider may recommend using home test kits to check for hidden blood in the stool.  A small camera at the end of a tube can be used to examine your colon (sigmoidoscopy or colonoscopy). This checks for the earliest forms of colorectal cancer.  Prostate and Testicular Cancer  Depending on your age and overall health, your health care provider may do certain tests to screen for prostate and testicular cancer.  Talk to your health care provider about any symptoms or concerns you have about testicular or prostate cancer.  Skin Cancer  Check your skin from head to toe regularly.  Tell your health care provider about any new moles or changes in moles, especially if: ? There is a change in a mole's size, shape, or color. ? You have a mole that is larger than a pencil eraser.  Always use sunscreen. Apply sunscreen liberally and repeat throughout the day.  Protect yourself by wearing long sleeves, pants, a wide-brimmed hat, and sunglasses when outside.  What should I know about heart disease, diabetes, and high blood pressure?  If you are 38-62 years of age, have your blood pressure checked every 3-5 years. If you are 57 years of age or older, have your blood pressure checked every year. You should have your blood pressure measured twice-once when you are at  a hospital or clinic, and once when you are not at a hospital or clinic. Record the average of the two measurements. To check your blood pressure when you are not at a hospital or clinic, you can use: ? An automated blood pressure machine at a pharmacy. ? A home blood pressure monitor.  Talk to your health care provider about your target blood pressure.  If you are between 80-75 years old, ask your health care provider if you should take aspirin to prevent heart disease.  Have regular diabetes screenings by checking your fasting blood sugar level. ? If you are at a normal weight and have a low risk for diabetes, have this test once every three years after the age of 23. ? If you are overweight and have a high risk for diabetes, consider being tested at a younger age or more often.  A one-time screening for abdominal aortic aneurysm (AAA) by ultrasound is recommended for men aged 45-75 years who are current or former smokers. What should I know about preventing infection? Hepatitis B If you have a higher risk for hepatitis B, you should be screened for this virus. Talk with your health care provider to find out if you are at risk for hepatitis B infection. Hepatitis C Blood testing is recommended for:  Everyone born from [birth year] through 1963.  Anyone with known risk factors for hepatitis C.  Sexually Transmitted Diseases (STDs)  You should be screened each year for STDs including gonorrhea and chlamydia if: ? You are sexually active and are younger than 75 years of age. ? You are older than 75 years of age and your health care provider tells you that you are at risk for this type of infection. ? Your sexual activity has changed since you were last screened and you are at an increased risk for chlamydia or gonorrhea. Ask your health care provider if you are at risk.  Talk with your health care provider about whether you are at high risk of being infected with HIV. Your health care provider  may recommend a prescription medicine to help prevent HIV infection.  What else can I do?  Schedule regular health, dental, and eye exams.  Stay current with your vaccines (immunizations).  Do not use any tobacco products, such as cigarettes, chewing tobacco, and e-cigarettes. If you need help quitting, ask your health care provider.  Limit alcohol intake to no more than 2 drinks per day. One drink equals 12 ounces of beer, 5 ounces of wine, or 1 ounces of hard liquor.  Do not use street drugs.  Do not share needles.  Ask your health care provider for help if you need support or information about quitting drugs.  Tell your health care provider if you often feel depressed.  Tell your health care provider if you have ever been abused or do not feel safe at home. This information is not intended to replace advice given to you by your health care provider. Make sure you discuss any questions you have with your health care provider. Document Released: 03/16/2008 Document  Revised: 05/17/2016 Document Reviewed: 06/22/2015 Elsevier Interactive Patient Education  2018 [place] (Tetanus and Diphtheria): What You Need to Know 1. Why get vaccinated? Tetanus  and diphtheria are very serious diseases. They are rare in the [place] today, but people who do become infected often have severe complications. Td vaccine is used to protect adolescents and adults from both of these diseases. Both tetanus and diphtheria are infections caused by bacteria. Diphtheria spreads from person to person through coughing or sneezing. Tetanus-causing bacteria enter the body through cuts, scratches, or wounds. TETANUS (lockjaw) causes painful muscle tightening and stiffness, usually all over the body.  It can lead to tightening of muscles in the head and neck so you can't open your mouth, swallow, or sometimes even breathe. Tetanus kills about 1 out of every 10 people who are infected even after  receiving the best medical care.  DIPHTHERIA can cause a thick coating to form in the back of the throat.  It can lead to breathing problems, paralysis, heart failure, and death.  Before vaccines, as many as 200,000 cases of diphtheria and hundreds of cases of tetanus were reported in the [place] each year. Since vaccination began, reports of cases for both diseases have dropped by about 99%. 2. Td vaccine Td vaccine can protect adolescents and adults from tetanus and diphtheria. Td is usually given as a booster dose every 10 years but it can also be given earlier after a severe and dirty wound or burn. Another vaccine, called Tdap, which protects against pertussis in addition to tetanus and diphtheria, is sometimes recommended instead of Td vaccine. Your doctor or the person giving you the vaccine can give you more information. Td may safely be given at the same time as other vaccines. 3. Some people should not get this vaccine  A person who has ever had a life-threatening allergic reaction after a previous dose of any tetanus or diphtheria containing vaccine, OR has a severe allergy to any part of this vaccine, should not get Td vaccine. Tell the person giving the vaccine about any severe allergies.  Talk to your doctor if you: ? had severe pain or swelling after any vaccine containing diphtheria or tetanus, ? ever had a condition called Guillain Barre Syndrome (GBS), ? aren't feeling well on the day the shot is scheduled. 4. What are the risks from Td vaccine? With any medicine, including vaccines, there is a chance of side effects. These are usually mild and go away on their own. Serious reactions are also possible but are rare. Most people who get Td vaccine do not have any problems with it. Mild problems following Td vaccine: (Did not interfere with activities)  Pain where the shot was given (about 8 people in 10)  Redness or swelling where the shot was given (about 1 person  in 4)  Mild fever (rare)  Headache (about 1 person in 4)  Tiredness (about 1 person in 4)  Moderate problems following Td vaccine: (Interfered with activities, but did not require medical attention)  Fever over 102F (rare)  Severe problems following Td vaccine: (Unable to perform usual activities; required medical attention)  Swelling, severe pain, bleeding and/or redness in the arm where the shot was given (rare).  Problems that could happen after any vaccine:  People sometimes faint after a medical procedure, including vaccination. Sitting or lying down for about 15 minutes can help prevent fainting, and injuries caused by a fall. Tell your doctor if you feel  dizzy, or have vision changes or ringing in the ears.  Some people get severe pain in the shoulder and have difficulty moving the arm where a shot was given. This happens very rarely.  Any medication can cause a severe allergic reaction. Such reactions from a vaccine are very rare, estimated at fewer than 1 in a million doses, and would happen within a few minutes to a few hours after the vaccination. As with any medicine, there is a very remote chance of a vaccine causing a serious injury or death. The safety of vaccines is always being monitored. For more information, visit: http://www.aguilar.org/ 5. What if there is a serious reaction? What should I look for? Look for anything that concerns you, such as signs of a severe allergic reaction, very high fever, or unusual behavior. Signs of a severe allergic reaction can include hives, swelling of the face and throat, difficulty breathing, a fast heartbeat, dizziness, and weakness. These would usually start a few minutes to a few hours after the vaccination. What should I do?  If you think it is a severe allergic reaction or other emergency that can't wait, call 9-1-1 or get the person to the nearest hospital. Otherwise, call your doctor.  Afterward, the reaction should  be reported to the Vaccine Adverse Event Reporting System (VAERS). Your doctor might file this report, or you can do it yourself through the VAERS web site at www.vaers.SamedayNews.es, or by calling 365-284-1692. ? VAERS does not give medical advice. 6. The National Vaccine Injury Compensation Program The [employer] Vaccine Injury Compensation Program (VICP) is a federal program that was created to compensate people who may have been injured by certain vaccines. Persons who believe they may have been injured by a vaccine can learn about the program and about filing a claim by calling 4121854304 or visiting the [place] website at GoldCloset.com.ee. There is a time limit to file a claim for compensation. 7. How can I learn more?  Ask your doctor. He or she can give you the vaccine package insert or suggest other sources of information.  Call your local or state health department.  Contact the Centers for Disease Control and Prevention (CDC): ? Call 5718033340 (1-800-CDC-INFO) ? Visit CDC's website at http://hunter.com/ CDC Td Vaccine VIS (01/11/16) This information is not intended to replace advice given to you by your health care provider. Make sure you discuss any questions you have with your health care provider. Document Released: 07/16/2006 Document Revised: 06/08/2016 Document Reviewed: 06/08/2016 Elsevier Interactive Patient Education  2017 [employer]. Pneumococcal Conjugate Vaccine (PCV13) What You Need to Know 1. Why get vaccinated? Vaccination can protect both children and adults from pneumococcal disease. Pneumococcal disease is caused by bacteria that can spread from person to person through close contact. It can cause ear infections, and it can also lead to more serious infections of the:  Lungs (pneumonia),  Blood (bacteremia), and  Covering of the brain and spinal cord (meningitis).  Pneumococcal pneumonia is most common among adults. Pneumococcal meningitis  can cause deafness and brain damage, and it kills about 1 child in 10 who get it. Anyone can get pneumococcal disease, but children under 9 years of age and adults 38 years and older, people with certain medical conditions, and cigarette smokers are at the highest risk. Before there was a vaccine, the [place] saw:  more than 700 cases of meningitis,  about 13,000 blood infections,  about 5 million ear infections, and  about 200 deaths  in children  under 5 each year from pneumococcal disease. Since vaccine became available, severe pneumococcal disease in these children has fallen by 88%. About 18,000 older adults die of pneumococcal disease each year in the [place]. Treatment of pneumococcal infections with penicillin and other drugs is not as effective as it used to be, because some strains of the disease have become resistant to these drugs. This makes prevention of the disease, through vaccination, even more important. 2. PCV13 vaccine Pneumococcal conjugate vaccine (called PCV13) protects against 13 types of pneumococcal bacteria. PCV13 is routinely given to children at 2, 4, 6, and 13-23 months of age. It is also recommended for children and adults 100 to 27 years of age with certain health conditions, and for all adults 62 years of age and older. Your doctor can give you details. 3. Some people should not get this vaccine Anyone who has ever had a life-threatening allergic reaction to a dose of this vaccine, to an earlier pneumococcal vaccine called PCV7, or to any vaccine containing diphtheria toxoid (for example, DTaP), should not get PCV13. Anyone with a severe allergy to any component of PCV13 should not get the vaccine. Tell your doctor if the person being vaccinated has any severe allergies. If the person scheduled for vaccination is not feeling well, your healthcare provider might decide to reschedule the shot on another day. 4. Risks of a vaccine reaction With any  medicine, including vaccines, there is a chance of reactions. These are usually mild and go away on their own, but serious reactions are also possible. Problems reported following PCV13 varied by age and dose in the series. The most common problems reported among children were:  About half became drowsy after the shot, had a temporary loss of appetite, or had redness or tenderness where the shot was given.  About 1 out of 3 had swelling where the shot was given.  About 1 out of 3 had a mild fever, and about 1 in 20 had a fever over 102.32F.  Up to about 8 out of 10 became fussy or irritable.  Adults have reported pain, redness, and swelling where the shot was given; also mild fever, fatigue, headache, chills, or muscle pain. Young children who get PCV13 along with inactivated flu vaccine at the same time may be at increased risk for seizures caused by fever. Ask your doctor for more information. Problems that could happen after any vaccine:  People sometimes faint after a medical procedure, including vaccination. Sitting or lying down for about 15 minutes can help prevent fainting, and injuries caused by a fall. Tell your doctor if you feel dizzy, or have vision changes or ringing in the ears.  Some older children and adults get severe pain in the shoulder and have difficulty moving the arm where a shot was given. This happens very rarely.  Any medication can cause a severe allergic reaction. Such reactions from a vaccine are very rare, estimated at about 1 in a million doses, and would happen within a few minutes to a few hours after the vaccination. As with any medicine, there is a very small chance of a vaccine causing a serious injury or death. The safety of vaccines is always being monitored. For more information, visit: http://www.aguilar.org/ 5. What if there is a serious reaction? What should I look for? Look for anything that concerns you, such as signs of a severe allergic  reaction, very high fever, or unusual behavior. Signs of a severe allergic reaction can include hives,  swelling of the face and throat, difficulty breathing, a fast heartbeat, dizziness, and weakness-usually within a few minutes to a few hours after the vaccination. What should I do?  If you think it is a severe allergic reaction or other emergency that can't wait, call 9-1-1 or get the person to the nearest hospital. Otherwise, call your doctor.  Reactions should be reported to the Vaccine Adverse Event Reporting System (VAERS). Your doctor should file this report, or you can do it yourself through the VAERS web site at www.vaers.SamedayNews.es, or by calling [phone]. ? VAERS does not give medical advice. 6. The National Vaccine Injury Compensation Program The [employer] Vaccine Injury Compensation Program (VICP) is a federal program that was created to compensate people who may have been injured by certain vaccines. Persons who believe they may have been injured by a vaccine can learn about the program and about filing a claim by calling (516)410-9176 or visiting the [place] website at GoldCloset.com.ee. There is a time limit to file a claim for compensation. 7. How can I learn more?  Ask your healthcare provider. He or she can give you the vaccine package insert or suggest other sources of information.  Call your local or state health department.  Contact the Centers for Disease Control and Prevention (CDC): ? Call 640-717-1786 (1-800-CDC-INFO) or ? Visit CDC's website at http://hunter.com/ Vaccine Information Statement, PCV13 Vaccine (08/06/2014) This information is not intended to replace advice given to you by your health care provider. Make sure you discuss any questions you have with your health care provider. Document Released: 07/16/2006 Document Revised: 06/08/2016 Document Reviewed: 06/08/2016 Elsevier Interactive Patient Education  2017 [place]. How to  Perform the Epley Maneuver The Epley maneuver is an exercise that relieves symptoms of vertigo. Vertigo is the feeling that you or your surroundings are moving when they are not. When you feel vertigo, you may feel like the room is spinning and have trouble walking. Dizziness is a little different than vertigo. When you are dizzy, you may feel unsteady or light-headed. You can do this maneuver at home whenever you have symptoms of vertigo. You can do it up to 3 times a day until your symptoms go away. Even though the Epley maneuver may relieve your vertigo for a few weeks, it is possible that your symptoms will return. This maneuver relieves vertigo, but it does not relieve dizziness. What are the risks? If it is done correctly, the Epley maneuver is considered safe. Sometimes it can lead to dizziness or nausea that goes away after a short time. If you develop other symptoms, such as changes in vision, weakness, or numbness, stop doing the maneuver and call your health care provider. How to perform the Epley maneuver 1. Sit on the edge of a bed or table with your back straight and your legs extended or hanging over the edge of the bed or table. 2. Turn your head halfway toward the affected ear or side. 3. Lie backward quickly with your head turned until you are lying flat on your back. You may want to position a pillow under your shoulders. 4. Hold this position for 30 seconds. You may experience an attack of vertigo. This is normal. 5. Turn your head to the opposite direction until your unaffected ear is facing the floor. 6. Hold this position for 30 seconds. You may experience an attack of vertigo. This is normal. Hold this position until the vertigo stops. 7. Turn your whole body to the same side as  your head. Hold for another 30 seconds. 8. Sit back up. You can repeat this exercise up to 3 times a day. Follow these instructions at home:  After doing the Epley maneuver, you can return to your  normal activities.  Ask your health care provider if there is anything you should do at home to prevent vertigo. He or she may recommend that you: ? Keep your head raised (elevated) with two or more pillows while you sleep. ? Do not sleep on the side of your affected ear. ? Get up slowly from bed. ? Avoid sudden movements during the day. ? Avoid extreme head movement, like looking up or bending over. Contact a health care provider if:  Your vertigo gets worse.  You have other symptoms, including: ? Nausea. ? Vomiting. ? Headache. Get help right away if:  You have vision changes.  You have a severe or worsening headache or neck pain.  You cannot stop vomiting.  You have new numbness or weakness in any part of your body. Summary  Vertigo is the feeling that you or your surroundings are moving when they are not.  The Epley maneuver is an exercise that relieves symptoms of vertigo.  If the Epley maneuver is done correctly, it is considered safe. You can do it up to 3 times a day. This information is not intended to replace advice given to you by your health care provider. Make sure you discuss any questions you have with your health care provider. Document Released: 09/23/2013 Document Revised: 08/08/2016 Document Reviewed: 08/08/2016 Elsevier Interactive Patient Education  2017 [employer].

## 2018-05-28 NOTE — Assessment & Plan Note (Signed)
Under good control. Continue CPAP. Call with any concerns.

## 2018-05-29 ENCOUNTER — Encounter: Payer: Self-pay | Admitting: Family Medicine

## 2018-05-29 LAB — COMPREHENSIVE METABOLIC PANEL
A/G RATIO: 1.7 (ref 1.2–2.2)
ALT: 23 IU/L (ref 0–44)
AST: 21 IU/L (ref 0–40)
Albumin: 4.6 g/dL (ref 3.5–4.8)
Alkaline Phosphatase: 94 IU/L (ref 39–117)
BILIRUBIN TOTAL: 0.4 mg/dL (ref 0.0–1.2)
BUN/Creatinine Ratio: 15 (ref 10–24)
BUN: 14 mg/dL (ref 8–27)
CHLORIDE: 98 mmol/L (ref 96–106)
CO2: 23 mmol/L (ref 20–29)
Calcium: 10 mg/dL (ref 8.6–10.2)
Creatinine, Ser: 0.94 mg/dL (ref 0.76–1.27)
GFR calc non Af Amer: 80 mL/min/{1.73_m2} (ref >59)
GFR, EST AFRICAN AMERICAN: 92 mL/min/{1.73_m2} (ref >59)
GLOBULIN, TOTAL: 2.7 g/dL (ref 1.5–4.5)
Glucose: 113 mg/dL — ABNORMAL HIGH (ref 65–99)
POTASSIUM: 4.1 mmol/L (ref 3.5–5.2)
SODIUM: 138 mmol/L (ref 134–144)
TOTAL PROTEIN: 7.3 g/dL (ref 6.0–8.5)

## 2018-05-29 LAB — PSA

## 2018-05-29 LAB — CBC WITH DIFFERENTIAL/PLATELET
BASOS: 1 %
Basophils Absolute: 0 10*3/uL (ref 0.0–0.2)
EOS (ABSOLUTE): 0.1 10*3/uL (ref 0.0–0.4)
Eos: 2 %
HEMATOCRIT: 42.4 % (ref 37.5–51.0)
Hemoglobin: 14.5 g/dL (ref 13.0–17.7)
Immature Grans (Abs): 0 10*3/uL (ref 0.0–0.1)
Immature Granulocytes: 0 %
Lymphocytes Absolute: 1.8 10*3/uL (ref 0.7–3.1)
Lymphs: 36 %
MCH: 33.1 pg — AB (ref 26.6–33.0)
MCHC: 34.2 g/dL (ref 31.5–35.7)
MCV: 97 fL (ref 79–97)
MONOS ABS: 0.5 10*3/uL (ref 0.1–0.9)
Monocytes: 9 %
NEUTROS ABS: 2.7 10*3/uL (ref 1.4–7.0)
Neutrophils: 52 %
PLATELETS: 206 10*3/uL (ref 150–450)
RBC: 4.38 x10E6/uL (ref 4.14–5.80)
RDW: 14.1 % (ref 12.3–15.4)
WBC: 5.1 10*3/uL (ref 3.4–10.8)

## 2018-05-29 LAB — LIPID PANEL W/O CHOL/HDL RATIO
Cholesterol, Total: 241 mg/dL — ABNORMAL HIGH (ref 100–199)
HDL: 60 mg/dL (ref >39)
LDL Calculated: 136 mg/dL — ABNORMAL HIGH (ref 0–99)
Triglycerides: 227 mg/dL — ABNORMAL HIGH (ref 0–149)
VLDL Cholesterol Cal: 45 mg/dL — ABNORMAL HIGH (ref 5–40)

## 2018-05-29 LAB — TSH: TSH: 1.89 u[IU]/mL (ref 0.450–4.500)

## 2018-07-03 ENCOUNTER — Other Ambulatory Visit: Payer: Self-pay | Admitting: Family Medicine

## 2018-11-04 DIAGNOSIS — R51 Headache: Secondary | ICD-10-CM | POA: Diagnosis not present

## 2018-11-04 DIAGNOSIS — R6883 Chills (without fever): Secondary | ICD-10-CM | POA: Diagnosis not present

## 2018-11-04 DIAGNOSIS — R0981 Nasal congestion: Secondary | ICD-10-CM | POA: Diagnosis not present

## 2018-11-04 DIAGNOSIS — R509 Fever, unspecified: Secondary | ICD-10-CM | POA: Diagnosis not present

## 2018-11-04 DIAGNOSIS — Z743 Need for continuous supervision: Secondary | ICD-10-CM | POA: Diagnosis not present

## 2018-11-04 DIAGNOSIS — N179 Acute kidney failure, unspecified: Secondary | ICD-10-CM | POA: Diagnosis not present

## 2018-11-04 DIAGNOSIS — R05 Cough: Secondary | ICD-10-CM | POA: Diagnosis not present

## 2018-11-05 DIAGNOSIS — N179 Acute kidney failure, unspecified: Secondary | ICD-10-CM | POA: Diagnosis not present

## 2018-11-05 DIAGNOSIS — R05 Cough: Secondary | ICD-10-CM | POA: Diagnosis not present

## 2018-11-05 DIAGNOSIS — R0602 Shortness of breath: Secondary | ICD-10-CM | POA: Diagnosis not present

## 2018-11-06 DIAGNOSIS — I10 Essential (primary) hypertension: Secondary | ICD-10-CM | POA: Diagnosis not present

## 2018-11-06 DIAGNOSIS — E872 Acidosis: Secondary | ICD-10-CM | POA: Diagnosis not present

## 2018-11-06 DIAGNOSIS — J189 Pneumonia, unspecified organism: Secondary | ICD-10-CM | POA: Diagnosis not present

## 2018-11-06 DIAGNOSIS — A419 Sepsis, unspecified organism: Secondary | ICD-10-CM | POA: Diagnosis not present

## 2018-11-06 DIAGNOSIS — R197 Diarrhea, unspecified: Secondary | ICD-10-CM | POA: Diagnosis not present

## 2018-11-06 DIAGNOSIS — Z7289 Other problems related to lifestyle: Secondary | ICD-10-CM | POA: Diagnosis not present

## 2018-11-06 DIAGNOSIS — N179 Acute kidney failure, unspecified: Secondary | ICD-10-CM | POA: Diagnosis not present

## 2018-11-06 DIAGNOSIS — J129 Viral pneumonia, unspecified: Secondary | ICD-10-CM | POA: Diagnosis not present

## 2018-11-15 ENCOUNTER — Other Ambulatory Visit: Payer: Self-pay

## 2018-11-15 ENCOUNTER — Ambulatory Visit (INDEPENDENT_AMBULATORY_CARE_PROVIDER_SITE_OTHER): Payer: Medicare Other | Admitting: Family Medicine

## 2018-11-15 ENCOUNTER — Ambulatory Visit
Admission: RE | Admit: 2018-11-15 | Discharge: 2018-11-15 | Disposition: A | Discharge status: Home / Self Care | Payer: Medicare Other | Source: Ambulatory Visit | Attending: Family Medicine | Admitting: Family Medicine

## 2018-11-15 ENCOUNTER — Encounter: Payer: Self-pay | Admitting: Family Medicine

## 2018-11-15 VITALS — BP 110/75 | HR 83 | Temp 98.4°F | Ht 67.1 in | Wt 180.0 lb

## 2018-11-15 DIAGNOSIS — J181 Lobar pneumonia, unspecified organism: Secondary | ICD-10-CM | POA: Diagnosis not present

## 2018-11-15 DIAGNOSIS — J1100 Influenza due to unidentified influenza virus with unspecified type of pneumonia: Secondary | ICD-10-CM | POA: Insufficient documentation

## 2018-11-15 NOTE — Progress Notes (Signed)
BP 110/75   Pulse 83   Temp 98.4 F (36.9 C) (Oral)   Ht 5' 7.1" (1.704 m)   Wt 180 lb (81.6 kg)   SpO2 97%   BMI 28.11 kg/m    Subjective:    Patient ID: Zachary Hansen, male    DOB: [birth year], 78 y.o.   MRN: [id number]  HPI: Zachary Hansen is a 78 y.o. male  Chief Complaint  Patient presents with  . Hospitalization Follow-up    [facility] in [place] last week. with had fever, chills, shortness of breath and tremors. pt states he was diagnosed with flu and pneumonia   [place]- went up to [place] to work on his family's house, ended up in the hospital on Monday 11/04/18 with the flu (flu A). Had pneumonia and in the hospital for 5 days. Not sent home on any antibiotics. Feeling well since getting out.  Time since discharge: 1 week Hospital/facility: [facility], in [place] Diagnosis: Flu and pneumonia Procedures/tests: Labs, CXR,  Consultants: None New medications: Tamiflu Discharge instructions:  Follow up here Status: better   Relevant past medical, surgical, family and social history reviewed and updated as indicated. Interim medical history since our last visit reviewed. Allergies and medications reviewed and updated.  Review of Systems  Constitutional: Positive for fatigue. Negative for activity change, appetite change, chills, diaphoresis, fever and unexpected weight change.  HENT: Negative.   Respiratory: Positive for cough. Negative for apnea, choking, chest tightness, shortness of breath, wheezing and stridor.   Cardiovascular: Negative.   Gastrointestinal: Negative.   Neurological: Negative.   Psychiatric/Behavioral: Negative.     Per HPI unless specifically indicated above     Objective:    BP 110/75   Pulse 83   Temp 98.4 F (36.9 C) (Oral)   Ht 5' 7.1" (1.704 m)   Wt 180 lb (81.6 kg)   SpO2 97%   BMI 28.11 kg/m   Wt Readings from Last 3 Encounters:  11/15/18 180 lb (81.6 kg)  05/28/18 180 lb 9.6 oz (81.9 kg)  11/22/17 181  lb 2 oz (82.2 kg)    Physical Exam Vitals signs and nursing note reviewed.  Constitutional:      General: He is not in acute distress.    Appearance: Normal appearance. He is not ill-appearing, toxic-appearing or diaphoretic.  HENT:     Head: Normocephalic and atraumatic.     Right Ear: External ear normal.     Left Ear: External ear normal.     Nose: Nose normal.     Mouth/Throat:     Mouth: Mucous membranes are moist.     Pharynx: Oropharynx is clear.  Eyes:     General: No scleral icterus.       Right eye: No discharge.        Left eye: No discharge.     Extraocular Movements: Extraocular movements intact.     Conjunctiva/sclera: Conjunctivae normal.     Pupils: Pupils are equal, round, and reactive to light.  Neck:     Musculoskeletal: Normal range of motion and neck supple.  Cardiovascular:     Rate and Rhythm: Normal rate and regular rhythm.     Pulses: Normal pulses.     Heart sounds: Normal heart sounds. No murmur. No friction rub. No gallop.   Pulmonary:     Effort: Pulmonary effort is normal. No respiratory distress.     Breath sounds: No stridor. Rhonchi (LLL) present. No wheezing or rales.  Chest:     Chest wall: No tenderness.  Musculoskeletal: Normal range of motion.  Skin:    General: Skin is warm and dry.     Capillary Refill: Capillary refill takes less than 2 seconds.     Coloration: Skin is not jaundiced or pale.     Findings: No bruising, erythema, lesion or rash.  Neurological:     General: No focal deficit present.     Mental Status: He is alert and oriented to person, place, and time. Mental status is at baseline.  Psychiatric:        Mood and Affect: Mood normal.        Behavior: Behavior normal.        Thought Content: Thought content normal.        Judgment: Judgment normal.     Results for orders placed or performed in visit on 05/28/18  CBC with Differential/Platelet  Result Value Ref Range   WBC 5.1 3.4 - 10.8 x10E3/uL   RBC 4.38 4.14  - 5.80 x10E6/uL   Hemoglobin 14.5 13.0 - 17.7 g/dL   Hematocrit 42.4 37.5 - 51.0 %   MCV 97 79 - 97 fL   MCH 33.1 (H) 26.6 - 33.0 pg   MCHC 34.2 31.5 - 35.7 g/dL   RDW 14.1 12.3 - 15.4 %   Platelets 206 150 - 450 x10E3/uL   Neutrophils 52 Not Estab. %   Lymphs 36 Not Estab. %   Monocytes 9 Not Estab. %   Eos 2 Not Estab. %   Basos 1 Not Estab. %   Neutrophils Absolute 2.7 1.4 - 7.0 x10E3/uL   Lymphocytes Absolute 1.8 0.7 - 3.1 x10E3/uL   Monocytes Absolute 0.5 0.1 - 0.9 x10E3/uL   EOS (ABSOLUTE) 0.1 0.0 - 0.4 x10E3/uL   Basophils Absolute 0.0 0.0 - 0.2 x10E3/uL   Immature Granulocytes 0 Not Estab. %   Immature Grans (Abs) 0.0 0.0 - 0.1 x10E3/uL  Comprehensive metabolic panel  Result Value Ref Range   Glucose 113 (H) 65 - 99 mg/dL   BUN 14 8 - 27 mg/dL   Creatinine, Ser 0.94 0.76 - 1.27 mg/dL   GFR calc non Af Amer 80 >59 mL/min/1.73   GFR calc Af Amer 92 >59 mL/min/1.73   BUN/Creatinine Ratio 15 10 - 24   Sodium 138 134 - 144 mmol/L   Potassium 4.1 3.5 - 5.2 mmol/L   Chloride 98 96 - 106 mmol/L   CO2 23 20 - 29 mmol/L   Calcium 10.0 8.6 - 10.2 mg/dL   Total Protein 7.3 6.0 - 8.5 g/dL   Albumin 4.6 3.5 - 4.8 g/dL   Globulin, Total 2.7 1.5 - 4.5 g/dL   Albumin/Globulin Ratio 1.7 1.2 - 2.2   Bilirubin Total 0.4 0.0 - 1.2 mg/dL   Alkaline Phosphatase 94 39 - 117 IU/L   AST 21 0 - 40 IU/L   ALT 23 0 - 44 IU/L  Lipid Panel w/o Chol/HDL Ratio  Result Value Ref Range   Cholesterol, Total 241 (H) 100 - 199 mg/dL   Triglycerides 227 (H) 0 - 149 mg/dL   HDL 60 >39 mg/dL   VLDL Cholesterol Cal 45 (H) 5 - 40 mg/dL   LDL Calculated 136 (H) 0 - 99 mg/dL  PSA  Result Value Ref Range   Prostate Specific Ag, Serum <0.1 0.0 - 4.0 ng/mL  TSH  Result Value Ref Range   TSH 1.890 0.450 - 4.500 uIU/mL  UA/M w/rflx Culture, Routine  Result Value Ref Range   Specific Gravity, UA 1.015 1.005 - 1.030   pH, UA 7.0 5.0 - 7.5   Color, UA Yellow Yellow   Appearance Ur Clear Clear    Leukocytes, UA Negative Negative   Protein, UA Negative Negative/Trace   Glucose, UA Negative Negative   Ketones, UA Negative Negative   RBC, UA Negative Negative   Bilirubin, UA Negative Negative   Urobilinogen, Ur 0.2 0.2 - 1.0 mg/dL   Nitrite, UA Negative Negative  Microalbumin, Urine Waived  Result Value Ref Range   Microalb, Ur Waived 10 0 - 19 mg/L   Creatinine, Urine Waived 100 10 - 300 mg/dL   Microalb/Creat Ratio <30 <30 mg/g  Fecal Occult Blood, Guaiac  Result Value Ref Range   Fecal Occult Blood Negative       Assessment & Plan:   Problem List Items Addressed This Visit    None    Visit Diagnoses    Pneumonia of left lower lobe due to influenza A virus    -  Primary   Improved and feeling much better. Will check CXR to make sure pneumonia is gone. If not will treat. Continue to monitor. Call with any concerns.    Relevant Orders   DG Chest 2 View       Follow up plan: Return Pending x-ray results. Marland Kitchen

## 2018-11-16 ENCOUNTER — Encounter: Payer: Self-pay | Admitting: Family Medicine

## 2019-02-03 ENCOUNTER — Encounter: Payer: Self-pay | Admitting: Family Medicine

## 2019-02-03 ENCOUNTER — Other Ambulatory Visit: Payer: Self-pay | Admitting: Family Medicine

## 2019-02-03 NOTE — Telephone Encounter (Signed)
Please set up virtual visit

## 2019-02-03 NOTE — Telephone Encounter (Signed)
Called pt to schedule virtual visit, no answer, left voicemail.

## 2019-02-03 NOTE — Telephone Encounter (Signed)
Is this okay okay to refill

## 2019-02-03 NOTE — Telephone Encounter (Signed)
Patient called, left VM to call the office to schedule an appointment.

## 2019-02-03 NOTE — Telephone Encounter (Signed)
Called pt to schedule appt, will  mail letter.

## 2019-02-11 ENCOUNTER — Ambulatory Visit (INDEPENDENT_AMBULATORY_CARE_PROVIDER_SITE_OTHER): Payer: Medicare Other | Admitting: Family Medicine

## 2019-02-11 ENCOUNTER — Encounter: Payer: Self-pay | Admitting: Family Medicine

## 2019-02-11 ENCOUNTER — Other Ambulatory Visit: Payer: Self-pay

## 2019-02-11 VITALS — BP 164/91 | HR 49 | Temp 97.2°F

## 2019-02-11 DIAGNOSIS — F324 Major depressive disorder, single episode, in partial remission: Secondary | ICD-10-CM | POA: Diagnosis not present

## 2019-02-11 DIAGNOSIS — I129 Hypertensive chronic kidney disease with stage 1 through stage 4 chronic kidney disease, or unspecified chronic kidney disease: Secondary | ICD-10-CM

## 2019-02-11 MED ORDER — FLUOXETINE HCL 40 MG PO CAPS: 40.0000 mg | ORAL_CAPSULE | Freq: Every day | ORAL | 1 refills | Status: DC

## 2019-02-11 MED ORDER — AMLODIPINE BESYLATE 5 MG PO TABS: 5.0000 mg | ORAL_TABLET | Freq: Every day | ORAL | 3 refills | Status: DC

## 2019-02-11 MED ORDER — VALSARTAN 40 MG PO TABS: 80.0000 mg | ORAL_TABLET | Freq: Every day | ORAL | 1 refills | Status: DC

## 2019-02-11 NOTE — Assessment & Plan Note (Signed)
Doing well on his prozac. Continue current regimen. Continue to monitor. Call with any concerns.

## 2019-02-11 NOTE — Assessment & Plan Note (Addendum)
Not under great control. Will continue his valsartan chlorthalidone and add 5mg  amlodipine. Recheck 1 month. Call with any concerns. Continue to monitor.

## 2019-02-11 NOTE — Progress Notes (Signed)
BP (!) 164/91   Pulse (!) 49   Temp (!) 97.2 F (36.2 C) (Oral)   SpO2 98%    Subjective:    Patient ID: Zachary Hansen, male    DOB: May 22, 1941, 78 y.o.   MRN: [id number]  HPI: Zachary Hansen is a 78 y.o. male  Chief Complaint  Patient presents with  . Depression  . Hypertension   HYPERTENSION Hypertension status: uncontrolled  Satisfied with current treatment? no Duration of hypertension: chronic BP monitoring frequency:  not checking BP medication side effects:  no Medication compliance: excellent compliance Previous BP meds: valsartan Aspirin: no Recurrent headaches: no Visual changes: no Palpitations: no Dyspnea: no Chest pain: no Lower extremity edema: no Dizzy/lightheaded: no  DEPRESSION Mood status: better Satisfied with current treatment?: yes Symptom severity: mild  Duration of current treatment : chronic Side effects: no Medication compliance: excellent compliance Psychotherapy/counseling: no  Previous psychiatric medications: fluoxetine Depressed mood: yes Anxious mood: no Anhedonia: no Significant weight loss or gain: no Insomnia: no  Fatigue: yes Feelings of worthlessness or guilt: no Impaired concentration/indecisiveness: no Suicidal ideations: no Hopelessness: no Crying spells: no Depression screen [facility] 2/9 02/11/2019 05/28/2018 11/22/2017 10/24/2017 09/07/2017  Decreased Interest 0 1 1 3 3   Down, Depressed, Hopeless 0 1 1 1 3   PHQ - 2 Score 0 2 2 4 6   Altered sleeping 1 3 2 3 3   Tired, decreased energy 1 3 2 3 3   Change in appetite 0 0 1 3 3   Feeling bad or failure about yourself  0 0 0 0 1  Trouble concentrating 0 0 0 1 1  Moving slowly or fidgety/restless 0 0 0 0 0  Suicidal thoughts 0 0 0 0 0  PHQ-9 Score 2 8 7 14 17   Difficult doing work/chores Not difficult at all Not difficult at all Somewhat difficult Very difficult Very difficult     Relevant past medical, surgical, family and social history reviewed and updated as  indicated. Interim medical history since our last visit reviewed. Allergies and medications reviewed and updated.  Review of Systems  Constitutional: Negative.   Respiratory: Negative.   Cardiovascular: Negative.   Musculoskeletal: Negative.   Neurological: Negative.   Psychiatric/Behavioral: Negative.     Per HPI unless specifically indicated above     Objective:    BP (!) 164/91   Pulse (!) 49   Temp (!) 97.2 F (36.2 C) (Oral)   SpO2 98%   Wt Readings from Last 3 Encounters:  11/15/18 180 lb (81.6 kg)  05/28/18 180 lb 9.6 oz (81.9 kg)  11/22/17 181 lb 2 oz (82.2 kg)    Physical Exam Vitals signs and nursing note reviewed.  Constitutional:      General: He is not in acute distress.    Appearance: Normal appearance. He is not ill-appearing, toxic-appearing or diaphoretic.  HENT:     Head: Normocephalic and atraumatic.     Right Ear: External ear normal.     Left Ear: External ear normal.     Nose: Nose normal.     Mouth/Throat:     Mouth: Mucous membranes are moist.     Pharynx: Oropharynx is clear.  Eyes:     General: No scleral icterus.       Right eye: No discharge.        Left eye: No discharge.     Extraocular Movements: Extraocular movements intact.     Conjunctiva/sclera: Conjunctivae normal.     Pupils: Pupils are  equal, round, and reactive to light.  Neck:     Musculoskeletal: Normal range of motion and neck supple.  Cardiovascular:     Rate and Rhythm: Normal rate and regular rhythm.     Pulses: Normal pulses.     Heart sounds: Murmur present. No friction rub. No gallop.   Pulmonary:     Effort: Pulmonary effort is normal. No respiratory distress.     Breath sounds: Normal breath sounds. No stridor. No wheezing, rhonchi or rales.  Chest:     Chest wall: No tenderness.  Musculoskeletal: Normal range of motion.  Skin:    General: Skin is warm and dry.     Capillary Refill: Capillary refill takes less than 2 seconds.     Coloration: Skin is not  jaundiced or pale.     Findings: No bruising, erythema, lesion or rash.  Neurological:     General: No focal deficit present.     Mental Status: He is alert and oriented to person, place, and time. Mental status is at baseline.  Psychiatric:        Mood and Affect: Mood normal.        Behavior: Behavior normal.        Thought Content: Thought content normal.        Judgment: Judgment normal.     Results for orders placed or performed in visit on 05/28/18  CBC with Differential/Platelet  Result Value Ref Range   WBC 5.1 3.4 - 10.8 x10E3/uL   RBC 4.38 4.14 - 5.80 x10E6/uL   Hemoglobin 14.5 13.0 - 17.7 g/dL   Hematocrit 42.4 37.5 - 51.0 %   MCV 97 79 - 97 fL   MCH 33.1 (H) 26.6 - 33.0 pg   MCHC 34.2 31.5 - 35.7 g/dL   RDW 14.1 12.3 - 15.4 %   Platelets 206 150 - 450 x10E3/uL   Neutrophils 52 Not Estab. %   Lymphs 36 Not Estab. %   Monocytes 9 Not Estab. %   Eos 2 Not Estab. %   Basos 1 Not Estab. %   Neutrophils Absolute 2.7 1.4 - 7.0 x10E3/uL   Lymphocytes Absolute 1.8 0.7 - 3.1 x10E3/uL   Monocytes Absolute 0.5 0.1 - 0.9 x10E3/uL   EOS (ABSOLUTE) 0.1 0.0 - 0.4 x10E3/uL   Basophils Absolute 0.0 0.0 - 0.2 x10E3/uL   Immature Granulocytes 0 Not Estab. %   Immature Grans (Abs) 0.0 0.0 - 0.1 x10E3/uL  Comprehensive metabolic panel  Result Value Ref Range   Glucose 113 (H) 65 - 99 mg/dL   BUN 14 8 - 27 mg/dL   Creatinine, Ser 0.94 0.76 - 1.27 mg/dL   GFR calc non Af Amer 80 >59 mL/min/1.73   GFR calc Af Amer 92 >59 mL/min/1.73   BUN/Creatinine Ratio 15 10 - 24   Sodium 138 134 - 144 mmol/L   Potassium 4.1 3.5 - 5.2 mmol/L   Chloride 98 96 - 106 mmol/L   CO2 23 20 - 29 mmol/L   Calcium 10.0 8.6 - 10.2 mg/dL   Total Protein 7.3 6.0 - 8.5 g/dL   Albumin 4.6 3.5 - 4.8 g/dL   Globulin, Total 2.7 1.5 - 4.5 g/dL   Albumin/Globulin Ratio 1.7 1.2 - 2.2   Bilirubin Total 0.4 0.0 - 1.2 mg/dL   Alkaline Phosphatase 94 39 - 117 IU/L   AST 21 0 - 40 IU/L   ALT 23 0 - 44 IU/L   Lipid Panel w/o Chol/HDL Ratio  Result Value  Ref Range   Cholesterol, Total 241 (H) 100 - 199 mg/dL   Triglycerides 227 (H) 0 - 149 mg/dL   HDL 60 >39 mg/dL   VLDL Cholesterol Cal 45 (H) 5 - 40 mg/dL   LDL Calculated 136 (H) 0 - 99 mg/dL  PSA  Result Value Ref Range   Prostate Specific Ag, Serum <0.1 0.0 - 4.0 ng/mL  TSH  Result Value Ref Range   TSH 1.890 0.450 - 4.500 uIU/mL  UA/M w/rflx Culture, Routine  Result Value Ref Range   Specific Gravity, UA 1.015 1.005 - 1.030   pH, UA 7.0 5.0 - 7.5   Color, UA Yellow Yellow   Appearance Ur Clear Clear   Leukocytes, UA Negative Negative   Protein, UA Negative Negative/Trace   Glucose, UA Negative Negative   Ketones, UA Negative Negative   RBC, UA Negative Negative   Bilirubin, UA Negative Negative   Urobilinogen, Ur 0.2 0.2 - 1.0 mg/dL   Nitrite, UA Negative Negative  Microalbumin, Urine Waived  Result Value Ref Range   Microalb, Ur Waived 10 0 - 19 mg/L   Creatinine, Urine Waived 100 10 - 300 mg/dL   Microalb/Creat Ratio <30 <30 mg/g  Fecal Occult Blood, Guaiac  Result Value Ref Range   Fecal Occult Blood Negative       Assessment & Plan:   Problem List Items Addressed This Visit      Genitourinary   Benign hypertensive renal disease - Primary    Not under great control. Will continue his valsartan chlorthalidone and add 5mg  amlodipine. Recheck 1 month. Call with any concerns. Continue to monitor.       Relevant Orders   Basic metabolic panel     Other   Depression, major, single episode, in partial remission ([place])    Doing well on his prozac. Continue current regimen. Continue to monitor. Call with any concerns.       Relevant Medications   FLUoxetine (PROZAC) 40 MG capsule       Follow up plan: Return in about 4 weeks (around 03/11/2019) for Follow up BP.

## 2019-02-12 ENCOUNTER — Encounter: Payer: Self-pay | Admitting: Family Medicine

## 2019-02-12 LAB — BASIC METABOLIC PANEL
BUN/Creatinine Ratio: 14 (ref 10–24)
BUN: 16 mg/dL (ref 8–27)
CO2: 25 mmol/L (ref 20–29)
Calcium: 10.3 mg/dL — ABNORMAL HIGH (ref 8.6–10.2)
Chloride: 96 mmol/L (ref 96–106)
Creatinine, Ser: 1.12 mg/dL (ref 0.76–1.27)
GFR calc Af Amer: 74 mL/min/{1.73_m2} (ref >59)
GFR calc non Af Amer: 64 mL/min/{1.73_m2} (ref >59)
Glucose: 98 mg/dL (ref 65–99)
Potassium: 4.1 mmol/L (ref 3.5–5.2)
Sodium: 137 mmol/L (ref 134–144)

## 2019-03-07 ENCOUNTER — Other Ambulatory Visit: Payer: Self-pay | Admitting: Family Medicine

## 2019-03-14 ENCOUNTER — Other Ambulatory Visit: Payer: Self-pay

## 2019-03-14 ENCOUNTER — Ambulatory Visit (INDEPENDENT_AMBULATORY_CARE_PROVIDER_SITE_OTHER): Payer: Medicare Other | Admitting: Family Medicine

## 2019-03-14 ENCOUNTER — Encounter: Payer: Self-pay | Admitting: Family Medicine

## 2019-03-14 VITALS — BP 125/80 | HR 60 | Temp 98.5°F | Ht 67.1 in | Wt 177.0 lb

## 2019-03-14 DIAGNOSIS — Z1211 Encounter for screening for malignant neoplasm of colon: Secondary | ICD-10-CM

## 2019-03-14 DIAGNOSIS — C61 Malignant neoplasm of prostate: Secondary | ICD-10-CM | POA: Diagnosis not present

## 2019-03-14 DIAGNOSIS — R3911 Hesitancy of micturition: Secondary | ICD-10-CM | POA: Diagnosis not present

## 2019-03-14 DIAGNOSIS — I129 Hypertensive chronic kidney disease with stage 1 through stage 4 chronic kidney disease, or unspecified chronic kidney disease: Secondary | ICD-10-CM

## 2019-03-14 MED ORDER — CHLORTHALIDONE 25 MG PO TABS: 12.5000 mg | ORAL_TABLET | Freq: Every day | ORAL | 1 refills | Status: DC

## 2019-03-14 MED ORDER — AMLODIPINE BESYLATE 5 MG PO TABS: 5.0000 mg | ORAL_TABLET | Freq: Every day | ORAL | 1 refills | Status: DC

## 2019-03-14 NOTE — Assessment & Plan Note (Signed)
Due for repeat PSA- drawn today.

## 2019-03-14 NOTE — Assessment & Plan Note (Signed)
Under good control on current regimen. Continue current regimen. Continue to monitor. Call with any concerns. Refills given. Labs drawn today.   

## 2019-03-14 NOTE — Progress Notes (Signed)
BP 125/80   Pulse 60   Temp 98.5 F (36.9 C) (Oral)   Ht 5' 7.1" (1.704 m)   Wt 177 lb (80.3 kg)   SpO2 97%   BMI 27.64 kg/m    Subjective:    Patient ID: Zachary Hansen, male    DOB: 08/08/1941, 78 y.o.   MRN: [id number]  HPI: TRENTAN Hansen is a 78 y.o. male  Chief Complaint  Patient presents with  . Hypertension    f/u pt has questions about cologuard/FOBT test   HYPERTENSION Hypertension status: better  Satisfied with current treatment? yes Duration of hypertension: chronic BP monitoring frequency:  not checking BP medication side effects:  no Medication compliance: excellent compliance Previous BP meds: amlodipine, chlorthalidone, valsartan Aspirin: no Recurrent headaches: no Visual changes: no Palpitations: no Dyspnea: no Chest pain: no Lower extremity edema: no Dizzy/lightheaded: yes- occasionally when going from sitting to standing.    Relevant past medical, surgical, family and social history reviewed and updated as indicated. Interim medical history since our last visit reviewed. Allergies and medications reviewed and updated.  Review of Systems  Constitutional: Negative.   Respiratory: Negative.   Cardiovascular: Negative.   Psychiatric/Behavioral: Negative.     Per HPI unless specifically indicated above     Objective:    BP 125/80   Pulse 60   Temp 98.5 F (36.9 C) (Oral)   Ht 5' 7.1" (1.704 m)   Wt 177 lb (80.3 kg)   SpO2 97%   BMI 27.64 kg/m   Wt Readings from Last 3 Encounters:  03/14/19 177 lb (80.3 kg)  11/15/18 180 lb (81.6 kg)  05/28/18 180 lb 9.6 oz (81.9 kg)    Physical Exam Vitals signs and nursing note reviewed.  Constitutional:      General: He is not in acute distress.    Appearance: Normal appearance. He is not ill-appearing, toxic-appearing or diaphoretic.  HENT:     Head: Normocephalic and atraumatic.     Right Ear: External ear normal.     Left Ear: External ear normal.     Nose: Nose normal.   Mouth/Throat:     Mouth: Mucous membranes are moist.     Pharynx: Oropharynx is clear.  Eyes:     General: No scleral icterus.       Right eye: No discharge.        Left eye: No discharge.     Extraocular Movements: Extraocular movements intact.     Conjunctiva/sclera: Conjunctivae normal.     Pupils: Pupils are equal, round, and reactive to light.  Neck:     Musculoskeletal: Normal range of motion and neck supple.  Cardiovascular:     Rate and Rhythm: Normal rate and regular rhythm.     Pulses: Normal pulses.     Heart sounds: Normal heart sounds. No murmur. No friction rub. No gallop.   Pulmonary:     Effort: Pulmonary effort is normal. No respiratory distress.     Breath sounds: Normal breath sounds. No stridor. No wheezing, rhonchi or rales.  Chest:     Chest wall: No tenderness.  Musculoskeletal: Normal range of motion.  Skin:    General: Skin is warm and dry.     Capillary Refill: Capillary refill takes less than 2 seconds.     Coloration: Skin is not jaundiced or pale.     Findings: No bruising, erythema, lesion or rash.  Neurological:     General: No focal deficit present.  Mental Status: He is alert and oriented to person, place, and time. Mental status is at baseline.  Psychiatric:        Mood and Affect: Mood normal.        Behavior: Behavior normal.        Thought Content: Thought content normal.        Judgment: Judgment normal.     Results for orders placed or performed in visit on 81/10/31  Basic metabolic panel  Result Value Ref Range   Glucose 98 65 - 99 mg/dL   BUN 16 8 - 27 mg/dL   Creatinine, Ser 1.12 0.76 - 1.27 mg/dL   GFR calc non Af Amer 64 >59 mL/min/1.73   GFR calc Af Amer 74 >59 mL/min/1.73   BUN/Creatinine Ratio 14 10 - 24   Sodium 137 134 - 144 mmol/L   Potassium 4.1 3.5 - 5.2 mmol/L   Chloride 96 96 - 106 mmol/L   CO2 25 20 - 29 mmol/L   Calcium 10.3 (H) 8.6 - 10.2 mg/dL      Assessment & Plan:   Problem List Items Addressed  This Visit      Genitourinary   Benign hypertensive renal disease - Primary    Under good control on current regimen. Continue current regimen. Continue to monitor. Call with any concerns. Refills given. Labs drawn today.       Relevant Orders   Basic metabolic panel   Malignant neoplasm of prostate ([place])    Due for repeat PSA- drawn today.      Relevant Orders   PSA    Other Visit Diagnoses    Screening for colon cancer       Will do cologuard- ordered today.   Relevant Orders   Cologuard       Follow up plan: Return in about 6 months (around 09/13/2019) for Physical.

## 2019-03-15 LAB — BASIC METABOLIC PANEL
BUN/Creatinine Ratio: 16 (ref 10–24)
BUN: 15 mg/dL (ref 8–27)
CO2: 26 mmol/L (ref 20–29)
Calcium: 9.5 mg/dL (ref 8.6–10.2)
Chloride: 100 mmol/L (ref 96–106)
Creatinine, Ser: 0.91 mg/dL (ref 0.76–1.27)
GFR calc Af Amer: 95 mL/min/{1.73_m2} (ref >59)
GFR calc non Af Amer: 82 mL/min/{1.73_m2} (ref >59)
Glucose: 125 mg/dL — ABNORMAL HIGH (ref 65–99)
Potassium: 3.6 mmol/L (ref 3.5–5.2)
Sodium: 139 mmol/L (ref 134–144)

## 2019-03-15 LAB — PSA: Prostate Specific Ag, Serum: 0.3 ng/mL (ref 0.0–4.0)

## 2019-03-18 ENCOUNTER — Encounter: Payer: Self-pay | Admitting: Family Medicine

## 2019-03-26 DIAGNOSIS — Z1211 Encounter for screening for malignant neoplasm of colon: Secondary | ICD-10-CM | POA: Diagnosis not present

## 2019-03-26 LAB — COLOGUARD: Cologuard: NEGATIVE

## 2019-06-19 ENCOUNTER — Telehealth: Payer: Self-pay | Admitting: Family Medicine

## 2019-06-19 NOTE — Chronic Care Management (AMB) (Signed)
°  Chronic Care Management   Outreach Note  06/19/2019 Name: Zachary Hansen MRN: [id number] DOB: 30-Dec-1940  Referred by: Valerie Roys, DO Reason for referral : Chronic Care Management (Initial CCM outreach was unsuccessful. )   An unsuccessful telephone outreach was attempted today. The patient was referred to the case management team by for assistance with chronic care management and care coordination.   Follow Up Plan: A HIPPA compliant phone message was left for the patient providing contact information and requesting a return call.  The care management team will reach out to the patient again over the next 7 days.  If patient returns call to provider office, please advise to call [place] [facility] [place]  [email or web address]   ??[phone]

## 2019-06-23 ENCOUNTER — Telehealth: Payer: Self-pay | Admitting: Family Medicine

## 2019-06-23 NOTE — Chronic Care Management (AMB) (Signed)
Chronic Care Management  ° °Note ° °06/23/2019 °Name: Zariah E Ballew MRN: [id number] DOB: 05/12/1941 ° °Ermias E Mossbarger is a 77 y.o. year old male who is a primary care patient of Johnson, Megan P, DO. I reached out to Timmie E Dobek by phone today in response to a referral sent by Mr. Raijon E Brickell's health plan.   ° °Mr. Zawislak was given information about Chronic Care Management services today including:  °1. CCM service includes personalized support from designated clinical staff supervised by his physician, including individualized plan of care and coordination with other care providers °2. 24/7 contact phone numbers for assistance for urgent and routine care needs. °3. Service will only be billed when office clinical staff spend 20 minutes or more in a month to coordinate care. °4. Only one practitioner may furnish and bill the service in a calendar month. °5. The patient may stop CCM services at any time (effective at the end of the month) by phone call to the office staff. °6. The patient will be responsible for cost sharing (co-pay) of up to 20% of the service fee (after annual deductible is met). ° °Patient agreed to services and verbal consent obtained.  ° °Follow up plan: °Telephone appointment with CCM team member scheduled for: 07/18/2019 ° °Bernice Cicero °Care Guide • [facility] °[place]   [facility]  °[email or web address]   ??[phone]   ° ° ° °

## 2019-06-24 NOTE — Chronic Care Management (AMB) (Signed)
Made in error

## 2019-07-11 ENCOUNTER — Telehealth: Payer: Self-pay | Admitting: Family Medicine

## 2019-07-11 NOTE — Telephone Encounter (Signed)
chlorthalidone (HYGROTON) 25 MG tablet  amLODipine (NORVASC) 5 MG tablet     Patient calling to inquire which of these medications he is currently supposed to be taking.

## 2019-07-11 NOTE — Telephone Encounter (Signed)
He should be on both of those and valsartan. He should have refills of them at the pharmacy

## 2019-07-11 NOTE — Telephone Encounter (Signed)
Patient notified and verbalized understanding. 

## 2019-07-18 ENCOUNTER — Ambulatory Visit: Payer: Self-pay | Admitting: *Deleted

## 2019-07-18 ENCOUNTER — Telehealth: Payer: Medicare Other

## 2019-07-18 DIAGNOSIS — I129 Hypertensive chronic kidney disease with stage 1 through stage 4 chronic kidney disease, or unspecified chronic kidney disease: Secondary | ICD-10-CM

## 2019-07-18 NOTE — Chronic Care Management (AMB) (Signed)
  Chronic Care Management   Outreach Note  07/18/2019 Name: Zachary Hansen MRN: [id number] DOB: 12-24-1940  Referred by: Valerie Roys, DO Reason for referral : Chronic Care Management (Unsuccessful Outreach x1)   An unsuccessful telephone outreach was attempted today. The patient was referred to the case management team by for assistance with care management and care coordination.   Follow Up Plan: A HIPPA compliant phone message was left for the patient providing contact information and requesting a return call.  The care management team will reach out to the patient again over the next 60 days.    Merlene Morse Britney Newstrom RN, BSN Nurse Case Editor, [facility]ing [facility]  [phone]) Business Mobile

## 2019-07-23 ENCOUNTER — Ambulatory Visit (INDEPENDENT_AMBULATORY_CARE_PROVIDER_SITE_OTHER): Payer: Medicare Other | Admitting: Family Medicine

## 2019-07-23 ENCOUNTER — Other Ambulatory Visit: Payer: Self-pay

## 2019-07-23 ENCOUNTER — Encounter: Payer: Self-pay | Admitting: Family Medicine

## 2019-07-23 VITALS — BP 135/84 | HR 80 | Temp 98.4°F

## 2019-07-23 DIAGNOSIS — T59811A Toxic effect of smoke, accidental (unintentional), initial encounter: Secondary | ICD-10-CM | POA: Diagnosis not present

## 2019-07-23 DIAGNOSIS — R05 Cough: Secondary | ICD-10-CM | POA: Diagnosis not present

## 2019-07-23 DIAGNOSIS — R059 Cough, unspecified: Secondary | ICD-10-CM

## 2019-07-23 MED ORDER — ALBUTEROL SULFATE HFA 108 (90 BASE) MCG/ACT IN AERS: 2.0000 | INHALATION_SPRAY | Freq: Four times a day (QID) | RESPIRATORY_TRACT | 0 refills | Status: DC | PRN

## 2019-07-23 MED ORDER — PREDNISONE 10 MG PO TABS: ORAL_TABLET | ORAL | 0 refills | Status: DC
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
## 2019-08-10 ENCOUNTER — Other Ambulatory Visit: Payer: Self-pay | Admitting: Family Medicine
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
## 2019-08-11 NOTE — Telephone Encounter (Signed)
Routing to provider  

## 2019-08-22 ENCOUNTER — Telehealth: Payer: Self-pay

## 2019-09-16 ENCOUNTER — Encounter: Payer: Medicare Other | Admitting: Family Medicine

## 2019-10-17 ENCOUNTER — Telehealth: Payer: Self-pay

## 2019-11-13 ENCOUNTER — Other Ambulatory Visit: Payer: Self-pay | Admitting: Family Medicine

## 2019-11-19 ENCOUNTER — Telehealth: Payer: Self-pay

## 2019-11-19 ENCOUNTER — Ambulatory Visit: Payer: Self-pay | Admitting: General Practice

## 2019-11-19 NOTE — Chronic Care Management (AMB) (Signed)
°  Chronic Care Management   Outreach Note  11/19/2019 Name: Zachary Hansen MRN: [id number] DOB: June 14, 1941  Referred by: Valerie Roys, DO Reason for referral : Chronic Care Management ([facility]ital outreach- 3rd attempt)   Third unsuccessful telephone outreach was attempted today. The patient was referred to the case management team for assistance with care management and care coordination. The patient's primary care provider has been notified of our unsuccessful attempts to make or maintain contact with the patient. The care management team is pleased to engage with this patient at any time in the future should he/she be interested in assistance from the care management team.   Follow Up Plan: A HIPPA compliant phone message was left for the patient providing contact information and requesting a return call.   Noreene Larsson RN, MSN, [facility] Mobile: [phone]

## 2019-11-28 ENCOUNTER — Telehealth: Payer: Self-pay

## 2019-12-03 ENCOUNTER — Ambulatory Visit: Payer: Self-pay | Admitting: General Practice

## 2019-12-03 NOTE — Chronic Care Management (AMB) (Signed)
  Chronic Care Management   Outreach Note  12/03/2019 Name: Zachary Hansen MRN: [id number] DOB: 09-02-1941  Referred by: Valerie Roys, DO Reason for referral : Chronic Care Management (Patient had left a VM about an insurance question and ask for a call back)   An unsuccessful telephone outreach was attempted today. The patient was referred to the case management team for assistance with care management and care coordination. The patient had called and left a voice mail concerned because he got a letter stating that Dr. Wynetta Emery was not in network anymore. Attempted to call the patient back but was unable to discuss with the patient the concerns he had.   Follow Up Plan: A HIPPA compliant phone message was left for the patient providing contact information and requesting a return call.   Noreene Larsson RN, MSN, [facility] Mobile: [phone]

## 2019-12-05 ENCOUNTER — Ambulatory Visit (INDEPENDENT_AMBULATORY_CARE_PROVIDER_SITE_OTHER): Payer: Medicare Other | Admitting: Family Medicine

## 2019-12-05 ENCOUNTER — Other Ambulatory Visit: Payer: Self-pay

## 2019-12-05 ENCOUNTER — Encounter: Payer: Self-pay | Admitting: Family Medicine

## 2019-12-05 VITALS — BP 126/81 | HR 70 | Temp 98.0°F

## 2019-12-05 DIAGNOSIS — I129 Hypertensive chronic kidney disease with stage 1 through stage 4 chronic kidney disease, or unspecified chronic kidney disease: Secondary | ICD-10-CM | POA: Diagnosis not present

## 2019-12-05 DIAGNOSIS — H8112 Benign paroxysmal vertigo, left ear: Secondary | ICD-10-CM | POA: Diagnosis not present

## 2019-12-05 DIAGNOSIS — R42 Dizziness and giddiness: Secondary | ICD-10-CM

## 2019-12-05 MED ORDER — HYDROCHLOROTHIAZIDE 25 MG PO TABS: 25.0000 mg | ORAL_TABLET | Freq: Every day | ORAL | 3 refills | Status: DC

## 2019-12-05 NOTE — Progress Notes (Signed)
BP 126/81 (BP Location: Right Arm, Cuff Size: Normal)   Pulse 70   Temp 98 F (36.7 C) (Oral)   SpO2 98%    Subjective:    Patient ID: Zachary Hansen, male    DOB: May 26, 1941, 79 y.o.   MRN: [id number]  HPI: Zachary Hansen is a 79 y.o. male  Chief Complaint  Patient presents with  . Dizziness    for 1 week   DIZZINESS- had an ear infection in December Duration: about a week Description of symptoms: lightheaded Duration of episode: couple of minutes Dizziness frequency: recurrent Provoking factors: yes Aggravating factors:  yes Triggered by rolling over in bed: no Triggered by bending over: yes Aggravated by head movement: no Aggravated by exertion, coughing, loud noises: no Recent head injury: no Recent or current viral symptoms: no History of vasovagal episodes: no Nausea: no Vomiting: no Tinnitus: yes- maybe a little worse Hearing loss: yes Aural fullness: no Headache: no Photophobia/phonophobia: no Unsteady gait: yes Postural instability: no Diplopia, dysarthria, dysphagia or weakness: no Related to exertion: no Pallor: no Diaphoresis: no Dyspnea: no Chest pain: no  HYPERTENSION Hypertension status: controlled  Satisfied with current treatment? no Duration of hypertension: chronic BP monitoring frequency:  not checking BP medication side effects:  yes Medication compliance: excellent compliance Previous BP meds:chlorthalidone, valsartan, amlodipine Aspirin: no Recurrent headaches: no Visual changes: no Palpitations: no Dyspnea: no Chest pain: no Lower extremity edema: no Dizzy/lightheaded: yes  Relevant past medical, surgical, family and social history reviewed and updated as indicated. Interim medical history since our last visit reviewed. Allergies and medications reviewed and updated.  Review of Systems  Constitutional: Negative.   HENT: Negative.   Respiratory: Negative.   Cardiovascular: Negative.   Musculoskeletal: Negative.     Neurological: Positive for dizziness and light-headedness. Negative for tremors, seizures, syncope, facial asymmetry, speech difficulty, weakness, numbness and headaches.  Psychiatric/Behavioral: Negative.     Per HPI unless specifically indicated above     Objective:    BP 126/81 (BP Location: Right Arm, Cuff Size: Normal)   Pulse 70   Temp 98 F (36.7 C) (Oral)   SpO2 98%   Wt Readings from Last 3 Encounters:  03/14/19 177 lb (80.3 kg)  11/15/18 180 lb (81.6 kg)  05/28/18 180 lb 9.6 oz (81.9 kg)   No data found.  Physical Exam Vitals and nursing note reviewed.  Constitutional:      General: He is not in acute distress.    Appearance: Normal appearance. He is not ill-appearing, toxic-appearing or diaphoretic.  HENT:     Head: Normocephalic and atraumatic.     Right Ear: Tympanic membrane, ear canal and external ear normal.     Left Ear: Tympanic membrane, ear canal and external ear normal.     Nose: Nose normal.     Mouth/Throat:     Mouth: Mucous membranes are moist.     Pharynx: Oropharynx is clear.  Eyes:     General: No scleral icterus.       Right eye: No discharge.        Left eye: No discharge.     Extraocular Movements: Extraocular movements intact.     Left eye: Nystagmus present.     Conjunctiva/sclera: Conjunctivae normal.     Pupils: Pupils are equal, round, and reactive to light.  Cardiovascular:     Rate and Rhythm: Normal rate and regular rhythm.     Pulses: Normal pulses.     Heart sounds:  Normal heart sounds. No murmur. No friction rub. No gallop.   Pulmonary:     Effort: Pulmonary effort is normal. No respiratory distress.     Breath sounds: Normal breath sounds. No stridor. No wheezing, rhonchi or rales.  Chest:     Chest wall: No tenderness.  Musculoskeletal:        General: Normal range of motion.     Cervical back: Normal range of motion and neck supple.  Skin:    General: Skin is warm and dry.     Capillary Refill: Capillary refill  takes less than 2 seconds.     Coloration: Skin is not jaundiced or pale.     Findings: No bruising, erythema, lesion or rash.  Neurological:     General: No focal deficit present.     Mental Status: He is alert and oriented to person, place, and time. Mental status is at baseline.  Psychiatric:        Mood and Affect: Mood normal.        Behavior: Behavior normal.        Thought Content: Thought content normal.        Judgment: Judgment normal.     Results for orders placed or performed in visit on [id number]  Basic metabolic panel  Result Value Ref Range   Glucose 88 65 - 99 mg/dL   BUN 18 8 - 27 mg/dL   Creatinine, Ser 1.10 0.76 - 1.27 mg/dL   GFR calc non Af Amer 65 >59 mL/min/1.73   GFR calc Af Amer 75 >59 mL/min/1.73   BUN/Creatinine Ratio 16 10 - 24   Sodium 138 134 - 144 mmol/L   Potassium 3.9 3.5 - 5.2 mmol/L   Chloride 100 96 - 106 mmol/L   CO2 25 20 - 29 mmol/L   Calcium 9.9 8.6 - 10.2 mg/dL  CBC with Differential/Platelet  Result Value Ref Range   WBC 5.8 3.4 - 10.8 x10E3/uL   RBC 4.27 4.14 - 5.80 x10E6/uL   Hemoglobin 13.9 13.0 - 17.7 g/dL   Hematocrit 40.8 37.5 - 51.0 %   MCV 96 79 - 97 fL   MCH 32.6 26.6 - 33.0 pg   MCHC 34.1 31.5 - 35.7 g/dL   RDW 13.3 11.6 - 15.4 %   Platelets 215 150 - 450 x10E3/uL   Neutrophils 57 Not Estab. %   Lymphs 32 Not Estab. %   Monocytes 8 Not Estab. %   Eos 2 Not Estab. %   Basos 1 Not Estab. %   Neutrophils Absolute 3.3 1.4 - 7.0 x10E3/uL   Lymphocytes Absolute 1.9 0.7 - 3.1 x10E3/uL   Monocytes Absolute 0.5 0.1 - 0.9 x10E3/uL   EOS (ABSOLUTE) 0.1 0.0 - 0.4 x10E3/uL   Basophils Absolute 0.1 0.0 - 0.2 x10E3/uL   Immature Granulocytes 0 Not Estab. %   Immature Grans (Abs) 0.0 0.0 - 0.1 x10E3/uL      Assessment & Plan:   Problem List Items Addressed This Visit      Genitourinary   Benign hypertensive renal disease    Dizzy since starting chlorthalidone. Will change to HCTZ, check labs and recheck 1 month.         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 Call if not getting better or getting worse.    Dizziness       Likely BPPV. Will check labs. Await  results. Call with any concerns.    Relevant Orders   Basic metabolic panel (Completed)   CBC with Differential/Platelet (Completed)       Follow up plan: Return in about 4 weeks (around 01/02/2020).

## 2019-12-05 NOTE — Patient Instructions (Signed)

## 2019-12-06 ENCOUNTER — Encounter: Payer: Self-pay | Admitting: Family Medicine

## 2019-12-06 LAB — CBC WITH DIFFERENTIAL/PLATELET
Basophils Absolute: 0.1 10*3/uL (ref 0.0–0.2)
Basos: 1 %
EOS (ABSOLUTE): 0.1 10*3/uL (ref 0.0–0.4)
Eos: 2 %
Hematocrit: 40.8 % (ref 37.5–51.0)
Hemoglobin: 13.9 g/dL (ref 13.0–17.7)
Immature Grans (Abs): 0 10*3/uL (ref 0.0–0.1)
Immature Granulocytes: 0 %
Lymphocytes Absolute: 1.9 10*3/uL (ref 0.7–3.1)
Lymphs: 32 %
MCH: 32.6 pg (ref 26.6–33.0)
MCHC: 34.1 g/dL (ref 31.5–35.7)
MCV: 96 fL (ref 79–97)
Monocytes Absolute: 0.5 10*3/uL (ref 0.1–0.9)
Monocytes: 8 %
Neutrophils Absolute: 3.3 10*3/uL (ref 1.4–7.0)
Neutrophils: 57 %
Platelets: 215 10*3/uL (ref 150–450)
RBC: 4.27 x10E6/uL (ref 4.14–5.80)
RDW: 13.3 % (ref 11.6–15.4)
WBC: 5.8 10*3/uL (ref 3.4–10.8)

## 2019-12-06 LAB — BASIC METABOLIC PANEL
BUN/Creatinine Ratio: 16 (ref 10–24)
BUN: 18 mg/dL (ref 8–27)
CO2: 25 mmol/L (ref 20–29)
Calcium: 9.9 mg/dL (ref 8.6–10.2)
Chloride: 100 mmol/L (ref 96–106)
Creatinine, Ser: 1.1 mg/dL (ref 0.76–1.27)
GFR calc Af Amer: 75 mL/min/{1.73_m2} (ref >59)
GFR calc non Af Amer: 65 mL/min/{1.73_m2} (ref >59)
Glucose: 88 mg/dL (ref 65–99)
Potassium: 3.9 mmol/L (ref 3.5–5.2)
Sodium: 138 mmol/L (ref 134–144)

## 2019-12-06 NOTE — Assessment & Plan Note (Signed)
Dizzy since starting chlorthalidone. Will change to HCTZ, check labs and recheck 1 month.

## 2019-12-07 ENCOUNTER — Encounter: Payer: Self-pay | Admitting: Family Medicine

## 2019-12-13 ENCOUNTER — Other Ambulatory Visit: Payer: Self-pay | Admitting: Family Medicine

## 2019-12-13 NOTE — Telephone Encounter (Signed)
Change of pharmacy- filled for remainder of previous prescription

## 2019-12-31 ENCOUNTER — Ambulatory Visit: Payer: Medicare Other

## 2020-01-09 ENCOUNTER — Other Ambulatory Visit: Payer: Self-pay

## 2020-01-09 ENCOUNTER — Ambulatory Visit (INDEPENDENT_AMBULATORY_CARE_PROVIDER_SITE_OTHER): Payer: Medicare Other | Admitting: Family Medicine

## 2020-01-09 ENCOUNTER — Encounter: Payer: Self-pay | Admitting: Family Medicine

## 2020-01-09 VITALS — BP 129/75 | HR 65 | Temp 98.5°F | Ht 67.72 in | Wt 177.1 lb

## 2020-01-09 DIAGNOSIS — I129 Hypertensive chronic kidney disease with stage 1 through stage 4 chronic kidney disease, or unspecified chronic kidney disease: Secondary | ICD-10-CM | POA: Diagnosis not present

## 2020-01-09 DIAGNOSIS — E291 Testicular hypofunction: Secondary | ICD-10-CM

## 2020-01-09 DIAGNOSIS — E782 Mixed hyperlipidemia: Secondary | ICD-10-CM | POA: Diagnosis not present

## 2020-01-09 DIAGNOSIS — C61 Malignant neoplasm of prostate: Secondary | ICD-10-CM | POA: Diagnosis not present

## 2020-01-09 DIAGNOSIS — F324 Major depressive disorder, single episode, in partial remission: Secondary | ICD-10-CM

## 2020-01-09 DIAGNOSIS — Z0000 Encounter for general adult medical examination without abnormal findings: Secondary | ICD-10-CM

## 2020-01-09 LAB — UA/M W/RFLX CULTURE, ROUTINE
Bilirubin, UA: NEGATIVE
Glucose, UA: NEGATIVE
Ketones, UA: NEGATIVE
Leukocytes,UA: NEGATIVE
Nitrite, UA: NEGATIVE
Protein,UA: NEGATIVE
RBC, UA: NEGATIVE
Specific Gravity, UA: 1.02 (ref 1.005–1.030)
Urobilinogen, Ur: 1 mg/dL (ref 0.2–1.0)
pH, UA: 7 (ref 5.0–7.5)

## 2020-01-09 LAB — MICROALBUMIN, URINE WAIVED
Creatinine, Urine Waived: 100 mg/dL (ref 10–300)
Microalb, Ur Waived: 30 mg/L — ABNORMAL HIGH (ref 0–19)
Microalb/Creat Ratio: <30 mg/g (ref <30)

## 2020-01-09 MED ORDER — AMLODIPINE BESYLATE 5 MG PO TABS: 5.0000 mg | ORAL_TABLET | Freq: Every day | ORAL | 1 refills | Status: DC

## 2020-01-09 MED ORDER — NAPROXEN 500 MG PO TABS: 500.0000 mg | ORAL_TABLET | Freq: Two times a day (BID) | ORAL | 1 refills | Status: DC | PRN

## 2020-01-09 MED ORDER — FLUOXETINE HCL 40 MG PO CAPS: 40.0000 mg | ORAL_CAPSULE | Freq: Every day | ORAL | 1 refills | Status: DC

## 2020-01-09 MED ORDER — CYCLOBENZAPRINE HCL 10 MG PO TABS: ORAL_TABLET | ORAL | 1 refills | Status: DC

## 2020-01-09 MED ORDER — VALSARTAN 40 MG PO TABS: 80.0000 mg | ORAL_TABLET | Freq: Every day | ORAL | 1 refills | Status: DC

## 2020-01-09 MED ORDER — HYDROCHLOROTHIAZIDE 25 MG PO TABS: 25.0000 mg | ORAL_TABLET | Freq: Every day | ORAL | 1 refills | Status: DC

## 2020-01-09 NOTE — Assessment & Plan Note (Signed)
Under good control on current regimen. Continue current regimen. Continue to monitor. Call with any concerns. Refills given.   

## 2020-01-09 NOTE — Assessment & Plan Note (Signed)
Under good control on current regimen. Continue current regimen. Continue to monitor. Call with any concerns. Refills given. Labs drawn today.   

## 2020-01-09 NOTE — Assessment & Plan Note (Signed)
Stable. Managed by urology at [facility]. Continue to monitor. PSA drawn today.

## 2020-01-09 NOTE — Assessment & Plan Note (Signed)
Stable. Managed by urology at [facility]. Continue to monitor.

## 2020-01-09 NOTE — Assessment & Plan Note (Signed)
Rechecking labs today. Await results. Treat as needed.  °

## 2020-01-09 NOTE — Patient Instructions (Addendum)
Preventative Services:  Health Risk Assessment and Personalized Prevention Plan: Done today Bone Mass Measurements: N/A CVD Screening: Done today Colon Cancer Screening: N/A Depression Screening: Done today Diabetes Screening: Done today Glaucoma Screening: See your eye doctor Hepatitis B vaccine: N/A Hepatitis C screening: N/A HIV Screening: N/A Flu Vaccine: Get in the fall Lung cancer Screening: N/A Obesity Screening: Done today Pneumonia Vaccines (2): up to date STI Screening: N/A PSA screening: Done today  We are recommending the vaccine to everyone who has not had an allergic reaction to any of the components of the vaccine. If you have specific questions about the vaccine, please bring them up with your health care provider to discuss them.   We will likely not be getting the vaccine in the office for the first rounds of vaccinations. The way they are releasing the vaccines is going to be through the health systems (like [facility], [facility], [facility], [facility]), through your county health department, or through the pharmacies.   The [facility] Department is giving vaccines to those 65+ and Health Care Workers Teachers and [place] providers start 11/26/19, Essential workers start 3/10 and those with co-morbidities start 12/24/19 Call [phone] to schedule  If you are 65+ you can get a vaccine through [facility] by signing up for an appointment.  You can sign up by going to: [email or web address].  You can get more information by going to: [email or web address]  [employer] next door is giving the [employer]- you can call [phone] or stop by there to schedule.   Health Maintenance After Age 28 After age 83, you are at a higher risk for certain long-term diseases and infections as well as injuries from falls. Falls are a major cause of broken bones and head injuries in people who are older than age 45. Getting regular preventive care can help to  keep you healthy and well. Preventive care includes getting regular testing and making lifestyle changes as recommended by your health care provider. Talk with your health care provider about:  Which screenings and tests you should have. A screening is a test that checks for a disease when you have no symptoms.  A diet and exercise plan that is right for you. What should I know about screenings and tests to prevent falls? Screening and testing are the best ways to find a health problem early. Early diagnosis and treatment give you the best chance of managing medical conditions that are common after age 82. Certain conditions and lifestyle choices may make you more likely to have a fall. Your health care provider may recommend:  Regular vision checks. Poor vision and conditions such as cataracts can make you more likely to have a fall. If you wear glasses, make sure to get your prescription updated if your vision changes.  Medicine review. Work with your health care provider to regularly review all of the medicines you are taking, including over-the-counter medicines. Ask your health care provider about any side effects that may make you more likely to have a fall. Tell your health care provider if any medicines that you take make you feel dizzy or sleepy.  Osteoporosis screening. Osteoporosis is a condition that causes the bones to get weaker. This can make the bones weak and cause them to break more easily.  Blood pressure screening. Blood pressure changes and medicines to control blood pressure can make you feel dizzy.  Strength and balance checks. Your health care provider may recommend certain tests to check your strength  and balance while standing, walking, or changing positions.  Foot health exam. Foot pain and numbness, as well as not wearing proper footwear, can make you more likely to have a fall.  Depression screening. You may be more likely to have a fall if you have a fear of falling,  feel emotionally low, or feel unable to do activities that you used to do.  Alcohol use screening. Using too much alcohol can affect your balance and may make you more likely to have a fall. What actions can I take to lower my risk of falls? General instructions  Talk with your health care provider about your risks for falling. Tell your health care provider if: ? You fall. Be sure to tell your health care provider about all falls, even ones that seem minor. ? You feel dizzy, sleepy, or off-balance.  Take over-the-counter and prescription medicines only as told by your health care provider. These include any supplements.  Eat a healthy diet and maintain a healthy weight. A healthy diet includes low-fat dairy products, low-fat (lean) meats, and fiber from whole grains, beans, and lots of fruits and vegetables. Home safety  Remove any tripping hazards, such as rugs, cords, and clutter.  Install safety equipment such as grab bars in bathrooms and safety rails on stairs.  Keep rooms and walkways well-lit. Activity   Follow a regular exercise program to stay fit. This will help you maintain your balance. Ask your health care provider what types of exercise are appropriate for you.  If you need a cane or walker, use it as recommended by your health care provider.  Wear supportive shoes that have nonskid soles. Lifestyle  Do not drink alcohol if your health care provider tells you not to drink.  If you drink alcohol, limit how much you have: ? 0-1 drink a day for women. ? 0-2 drinks a day for men.  Be aware of how much alcohol is in your drink. In the [place] one drink equals one typical bottle of beer (12 oz), one-half glass of wine (5 oz), or one shot of hard liquor (1 oz).  Do not use any products that contain nicotine or tobacco, such as cigarettes and e-cigarettes. If you need help quitting, ask your health care provider. Summary  Having a healthy lifestyle and getting  preventive care can help to protect your health and wellness after age 98.  Screening and testing are the best way to find a health problem early and help you avoid having a fall. Early diagnosis and treatment give you the best chance for managing medical conditions that are more common for people who are older than age 67.  Falls are a major cause of broken bones and head injuries in people who are older than age 43. Take precautions to prevent a fall at home.  Work with your health care provider to learn what changes you can make to improve your health and wellness and to prevent falls. This information is not intended to replace advice given to you by your health care provider. Make sure you discuss any questions you have with your health care provider. Document Revised: 01/09/2019 Document Reviewed: 08/01/2017 Elsevier Patient Education  2020 [employer].

## 2020-01-09 NOTE — Progress Notes (Signed)
BP 129/75   Pulse 65   Temp 98.5 F (36.9 C)   Ht 5' 7.72" (1.72 m)   Wt 177 lb 2 oz (80.3 kg)   SpO2 95%   BMI 27.16 kg/m    Subjective:    Patient ID: Zachary Hansen, male    DOB: Oct 22, 1940, 79 y.o.   MRN: [id number]  HPI: Zachary Hansen is a 79 y.o. male presenting on 01/09/2020 for comprehensive medical examination. Current medical complaints include:  HYPERTENSION / HYPERLIPIDEMIA Satisfied with current treatment? yes Duration of hypertension: chronic BP monitoring frequency: rarely BP medication side effects: no Past BP meds: valsartan, HCTZ, amlodipine Duration of hyperlipidemia: chronic Cholesterol medication side effects: not on anything Cholesterol supplements: none Past cholesterol medications: none Medication compliance: excellent compliance Aspirin: no Recent stressors: no Recurrent headaches: no Visual changes: no Palpitations: no Dyspnea: no Chest pain: no Lower extremity edema: no Dizzy/lightheaded: yes x1  DEPRESSION Mood status: controlled Satisfied with current treatment?: yes Symptom severity: mild  Duration of current treatment : chronic Side effects: no Medication compliance: excellent compliance Psychotherapy/counseling: no  Depressed mood: no Anxious mood: no Anhedonia: no Significant weight loss or gain: no Insomnia: no  Fatigue: no Feelings of worthlessness or guilt: no Impaired concentration/indecisiveness: no Suicidal ideations: no Hopelessness: no Crying spells: no Depression screen [facility] 2/9 01/09/2020 02/11/2019 05/28/2018 11/22/2017 10/24/2017  Decreased Interest 1 0 1 1 3   Down, Depressed, Hopeless 0 0 1 1 1   PHQ - 2 Score 1 0 2 2 4   Altered sleeping 2 1 3 2 3   Tired, decreased energy 2 1 3 2 3   Change in appetite 1 0 0 1 3  Feeling bad or failure about yourself  0 0 0 0 0  Trouble concentrating 0 0 0 0 1  Moving slowly or fidgety/restless 0 0 0 0 0  Suicidal thoughts 0 0 0 0 0  PHQ-9 Score 6 2 8 7 14   Difficult doing  work/chores Not difficult at all Not difficult at all Not difficult at all Somewhat difficult Very difficult   Interim Problems from his last visit: no  Functional Status Survey: Is the patient deaf or have difficulty hearing?: Yes Does the patient have difficulty seeing, even when wearing glasses/contacts?: No Does the patient have difficulty concentrating, remembering, or making decisions?: No Does the patient have difficulty walking or climbing stairs?: No Does the patient have difficulty dressing or bathing?: No Does the patient have difficulty doing errands alone such as visiting a doctor's office or shopping?: No  FALL RISK: Fall Risk  01/09/2020 02/11/2019 05/28/2018 01/24/2017 12/06/2015  Falls in the past year? 0 0 No No No  Number falls in past yr: 0 0 - - -  Injury with Fall? 0 0 - - -  Follow up - Falls evaluation completed - - -    Depression Screen Depression screen [facility] 2/9 01/09/2020 02/11/2019 05/28/2018 11/22/2017 10/24/2017  Decreased Interest 1 0 1 1 3   Down, Depressed, Hopeless 0 0 1 1 1   PHQ - 2 Score 1 0 2 2 4   Altered sleeping 2 1 3 2 3   Tired, decreased energy 2 1 3 2 3   Change in appetite 1 0 0 1 3  Feeling bad or failure about yourself  0 0 0 0 0  Trouble concentrating 0 0 0 0 1  Moving slowly or fidgety/restless 0 0 0 0 0  Suicidal thoughts 0 0 0 0 0  PHQ-9 Score 6 2 8  7  14  Difficult doing work/chores Not difficult at all Not difficult at all Not difficult at all Somewhat difficult Very difficult   Advanced Directives Has one, but not notarized  Past Medical History:  Past Medical History:  Diagnosis Date  . ADHD (attention deficit hyperactivity disorder)   . Depression   . Prostate cancer ([place])   . Sleep apnea     Surgical History:  Past Surgical History:  Procedure Laterality Date  . NASAL SEPTUM SURGERY    . prostate cancer surgery    . THROAT SURGERY     Due to snoring    Medications:  Current Outpatient Medications on File Prior to Visit    Medication Sig  . Cyanocobalamin (VITAMIN B 12 PO) Take 1,000 mcg by mouth daily.  . Misc Natural Products (OSTEO BI-FLEX JOINT SHIELD PO) Take by mouth.  . Multiple Vitamin (MULTI-VITAMINS) TABS Take by mouth.  Marland Kitchen VITAMIN E PO Take by mouth daily.   No current facility-administered medications on file prior to visit.    Allergies:  Allergies  Allergen Reactions  . Lisinopril Cough    Social History:  Social History   Socioeconomic History  . Marital status: Married    Spouse name: Not on file  . Number of children: Not on file  . Years of education: Not on file  . Highest education level: Not on file  Occupational History  . Not on file  Tobacco Use  . Smoking status: Former Smoker    Quit date: 10/02/1968    Years since quitting: 51.3  . Smokeless tobacco: Never Used  Substance and Sexual Activity  . Alcohol use: Yes    Alcohol/week: 23.0 standard drinks    Types: 21 Cans of beer, 2 Shots of liquor per week  . Drug use: No  . Sexual activity: Never  Other Topics Concern  . Not on file  Social History Narrative  . Not on file   Social Determinants of Health   Financial Resource Strain:   . Difficulty of Paying Living Expenses:   Food Insecurity:   . Worried About Charity fundraiser in the Last Year:   . Arboriculturist in the Last Year:   Transportation Needs:   . Film/video editor (Medical):   Marland Kitchen Lack of Transportation (Non-Medical):   Physical Activity:   . Days of Exercise per Week:   . Minutes of Exercise per Session:   Stress:   . Feeling of Stress :   Social Connections:   . Frequency of Communication with Friends and Family:   . Frequency of Social Gatherings with Friends and Family:   . Attends Religious Services:   . Active Member of Clubs or Organizations:   . Attends Archivist Meetings:   Marland Kitchen Marital Status:   Intimate Partner Violence:   . Fear of Current or Ex-Partner:   . Emotionally Abused:   Marland Kitchen Physically Abused:   .  Sexually Abused:    Social History   Tobacco Use  Smoking Status Former Smoker  . Quit date: 10/02/1968  . Years since quitting: 51.3  Smokeless Tobacco Never Used   Social History   Substance and Sexual Activity  Alcohol Use Yes  . Alcohol/week: 23.0 standard drinks  . Types: 21 Cans of beer, 2 Shots of liquor per week    Family History:  Family History  Problem Relation Age of Onset  . Arthritis Mother   . Heart disease Father   . Cancer  Father   . Heart disease Sister   . Cancer Sister   . Sleep apnea Brother   . Alzheimer's disease Maternal Grandfather   . Heart disease Paternal Grandmother   . Diverticulitis Sister   . Sleep apnea Sister     Past medical history, surgical history, medications, allergies, family history and social history reviewed with patient today and changes made to appropriate areas of the chart.   Review of Systems  Constitutional: Negative.   HENT: Negative.   Eyes: Negative.   Respiratory: Negative.   Cardiovascular: Negative.   Gastrointestinal: Positive for heartburn (goes away with OTC meds). Negative for abdominal pain, blood in stool, constipation, diarrhea, melena, nausea and vomiting.  Genitourinary: Negative.   Musculoskeletal: Positive for back pain. Negative for falls, joint pain, myalgias and neck pain.  Skin: Negative.   Neurological: Negative.   Endo/Heme/Allergies: Negative.   Psychiatric/Behavioral: Negative.     All other ROS negative except what is listed above and in the HPI.      Objective:    BP 129/75   Pulse 65   Temp 98.5 F (36.9 C)   Ht 5' 7.72" (1.72 m)   Wt 177 lb 2 oz (80.3 kg)   SpO2 95%   BMI 27.16 kg/m   Wt Readings from Last 3 Encounters:  01/09/20 177 lb 2 oz (80.3 kg)  03/14/19 177 lb (80.3 kg)  11/15/18 180 lb (81.6 kg)    Physical Exam Vitals and nursing note reviewed.  Constitutional:      General: He is not in acute distress.    Appearance: Normal appearance. He is obese. He is not  ill-appearing, toxic-appearing or diaphoretic.  HENT:     Head: Normocephalic and atraumatic.     Right Ear: Tympanic membrane, ear canal and external ear normal. There is no impacted cerumen.     Left Ear: Tympanic membrane, ear canal and external ear normal. There is no impacted cerumen.     Nose: Nose normal. No congestion or rhinorrhea.     Mouth/Throat:     Mouth: Mucous membranes are moist.     Pharynx: Oropharynx is clear. No oropharyngeal exudate or posterior oropharyngeal erythema.  Eyes:     General: No scleral icterus.       Right eye: No discharge.        Left eye: No discharge.     Extraocular Movements: Extraocular movements intact.     Conjunctiva/sclera: Conjunctivae normal.     Pupils: Pupils are equal, round, and reactive to light.  Neck:     Vascular: No carotid bruit.  Cardiovascular:     Rate and Rhythm: Normal rate and regular rhythm.     Pulses: Normal pulses.     Heart sounds: No murmur. No friction rub. No gallop.   Pulmonary:     Effort: Pulmonary effort is normal. No respiratory distress.     Breath sounds: Normal breath sounds. No stridor. No wheezing, rhonchi or rales.  Chest:     Chest wall: No tenderness.  Abdominal:     General: Abdomen is flat. Bowel sounds are normal. There is no distension.     Palpations: Abdomen is soft. There is no mass.     Tenderness: There is no abdominal tenderness. There is no right CVA tenderness, left CVA tenderness, guarding or rebound.     Hernia: No hernia is present.  Genitourinary:    Comments: Genital exam deferred with shared decision making Musculoskeletal:  General: No swelling, tenderness, deformity or signs of injury.     Cervical back: Normal range of motion and neck supple. No rigidity. No muscular tenderness.     Right lower leg: No edema.     Left lower leg: No edema.  Lymphadenopathy:     Cervical: No cervical adenopathy.  Skin:    General: Skin is warm and dry.     Capillary Refill:  Capillary refill takes less than 2 seconds.     Coloration: Skin is not jaundiced or pale.     Findings: No bruising, erythema, lesion or rash.  Neurological:     General: No focal deficit present.     Mental Status: He is alert and oriented to person, place, and time.     Cranial Nerves: No cranial nerve deficit.     Sensory: No sensory deficit.     Motor: No weakness.     Coordination: Coordination normal.     Gait: Gait normal.     Deep Tendon Reflexes: Reflexes normal.  Psychiatric:        Mood and Affect: Mood normal.        Behavior: Behavior normal.        Thought Content: Thought content normal.        Judgment: Judgment normal.     6CIT Screen 01/09/2020 01/09/2020 05/28/2018 01/24/2017  What Year? 0 points 0 points 0 points 0 points  What month? 0 points 0 points 0 points 0 points  What time? 0 points 0 points 0 points 0 points  Count back from 20 0 points 0 points 0 points 0 points  Months in reverse 0 points 0 points 0 points 2 points  Repeat phrase 0 points 0 points 0 points 0 points  Total Score 0 0 0 2     Results for orders placed or performed in visit on [id number]  Basic metabolic panel  Result Value Ref Range   Glucose 88 65 - 99 mg/dL   BUN 18 8 - 27 mg/dL   Creatinine, Ser 1.10 0.76 - 1.27 mg/dL   GFR calc non Af Amer 65 >59 mL/min/1.73   GFR calc Af Amer 75 >59 mL/min/1.73   BUN/Creatinine Ratio 16 10 - 24   Sodium 138 134 - 144 mmol/L   Potassium 3.9 3.5 - 5.2 mmol/L   Chloride 100 96 - 106 mmol/L   CO2 25 20 - 29 mmol/L   Calcium 9.9 8.6 - 10.2 mg/dL  CBC with Differential/Platelet  Result Value Ref Range   WBC 5.8 3.4 - 10.8 x10E3/uL   RBC 4.27 4.14 - 5.80 x10E6/uL   Hemoglobin 13.9 13.0 - 17.7 g/dL   Hematocrit 40.8 37.5 - 51.0 %   MCV 96 79 - 97 fL   MCH 32.6 26.6 - 33.0 pg   MCHC 34.1 31.5 - 35.7 g/dL   RDW 13.3 11.6 - 15.4 %   Platelets 215 150 - 450 x10E3/uL   Neutrophils 57 Not Estab. %   Lymphs 32 Not Estab. %   Monocytes 8 Not Estab.  %   Eos 2 Not Estab. %   Basos 1 Not Estab. %   Neutrophils Absolute 3.3 1.4 - 7.0 x10E3/uL   Lymphocytes Absolute 1.9 0.7 - 3.1 x10E3/uL   Monocytes Absolute 0.5 0.1 - 0.9 x10E3/uL   EOS (ABSOLUTE) 0.1 0.0 - 0.4 x10E3/uL   Basophils Absolute 0.1 0.0 - 0.2 x10E3/uL   Immature Granulocytes 0 Not Estab. %   Immature Grans (Abs) 0.0  0.0 - 0.1 x10E3/uL      Assessment & Plan:   Problem List Items Addressed This Visit      Endocrine   Hypogonadism in male    Stable. Managed by urology at [facility]. Continue to monitor.         Genitourinary   Benign hypertensive renal disease    Under good control on current regimen. Continue current regimen. Continue to monitor. Call with any concerns. Refills given. Labs drawn today.       Relevant Orders   CBC with Differential/Platelet   Comprehensive metabolic panel   Microalbumin, Urine Waived   Malignant neoplasm of prostate (HCC)    Stable. Managed by urology at [facility]. Continue to monitor. PSA drawn today.      Relevant Orders   CBC with Differential/Platelet   Comprehensive metabolic panel   PSA   UA/M w/rflx Culture, Routine     Other   Depression, major, single episode, in partial remission ([place])    Under good control on current regimen. Continue current regimen. Continue to monitor. Call with any concerns. Refills given.       Relevant Medications   FLUoxetine (PROZAC) 40 MG capsule   Other Relevant Orders   CBC with Differential/Platelet   Comprehensive metabolic panel   TSH   Hyperlipidemia    Rechecking labs today. Await results. Treat as needed.       Relevant Medications   valsartan (DIOVAN) 40 MG tablet   hydrochlorothiazide (HYDRODIURIL) 25 MG tablet   amLODipine (NORVASC) 5 MG tablet   Other Relevant Orders   CBC with Differential/Platelet   Comprehensive metabolic panel   Lipid Panel w/o Chol/HDL Ratio    Other Visit Diagnoses    Medicare annual wellness visit, subsequent    -  Primary   Preventative care  discussed today as below.    Routine general medical examination at a health care facility       Vaccines up to date. Screening labs checked today. Continue diet and exercise. Call with any concerns.        Preventative Services:  Health Risk Assessment and Personalized Prevention Plan: Done today Bone Mass Measurements: N/A CVD Screening: Done today Colon Cancer Screening: N/A Depression Screening: Done today Diabetes Screening: Done today Glaucoma Screening: See your eye doctor Hepatitis B vaccine: N/A Hepatitis C screening: N/A HIV Screening: N/A Flu Vaccine: Get in the fall Lung cancer Screening: N/A Obesity Screening: Done today Pneumonia Vaccines (2): up to date STI Screening: N/A PSA screening: Done today  LABORATORY TESTING:  Health maintenance labs ordered today as discussed above.   The natural history of prostate cancer and ongoing controversy regarding screening and potential treatment outcomes of prostate cancer has been discussed with the patient. The meaning of a false positive PSA and a false negative PSA has been discussed. He indicates understanding of the limitations of this screening test and wishes to proceed with screening PSA testing.   IMMUNIZATIONS:   - Tdap: Tetanus vaccination status reviewed: last tetanus booster within 10 years. - Influenza: Postponed to flu season - Pneumovax: Up to date - Prevnar: Up to date - Zostavax vaccine: Up to date  PATIENT COUNSELING:    Sexuality: Discussed sexually transmitted diseases, partner selection, use of condoms, avoidance of unintended pregnancy  and contraceptive alternatives.   Advised to avoid cigarette smoking.  I discussed with the patient that most people either abstain from alcohol or drink within safe limits (<=14/week and <=4 drinks/occasion for males, <=7/weeks and <=  3 drinks/occasion for females) and that the risk for alcohol disorders and other health effects rises proportionally with the number  of drinks per week and how often a drinker exceeds daily limits.  Discussed cessation/primary prevention of drug use and availability of treatment for abuse.   Diet: Encouraged to adjust caloric intake to maintain  or achieve ideal body weight, to reduce intake of dietary saturated fat and total fat, to limit sodium intake by avoiding high sodium foods and not adding table salt, and to maintain adequate dietary potassium and calcium preferably from fresh fruits, vegetables, and low-fat dairy products.    stressed the importance of regular exercise  Injury prevention: Discussed safety belts, safety helmets, smoke detector, smoking near bedding or upholstery.   Dental health: Discussed importance of regular tooth brushing, flossing, and dental visits.   Follow up plan: NEXT PREVENTATIVE PHYSICAL DUE IN 1 YEAR. Return in about 6 months (around 07/10/2020).

## 2020-01-10 LAB — COMPREHENSIVE METABOLIC PANEL
ALT: 20 IU/L (ref 0–44)
AST: 19 IU/L (ref 0–40)
Albumin/Globulin Ratio: 1.7 (ref 1.2–2.2)
Albumin: 4.7 g/dL (ref 3.7–4.7)
Alkaline Phosphatase: 98 IU/L (ref 39–117)
BUN/Creatinine Ratio: 15 (ref 10–24)
BUN: 17 mg/dL (ref 8–27)
Bilirubin Total: 0.7 mg/dL (ref 0.0–1.2)
CO2: 22 mmol/L (ref 20–29)
Calcium: 9.5 mg/dL (ref 8.6–10.2)
Chloride: 95 mmol/L — ABNORMAL LOW (ref 96–106)
Creatinine, Ser: 1.15 mg/dL (ref 0.76–1.27)
GFR calc Af Amer: 71 mL/min/{1.73_m2} (ref >59)
GFR calc non Af Amer: 61 mL/min/{1.73_m2} (ref >59)
Globulin, Total: 2.8 g/dL (ref 1.5–4.5)
Glucose: 98 mg/dL (ref 65–99)
Potassium: 3.8 mmol/L (ref 3.5–5.2)
Sodium: 133 mmol/L — ABNORMAL LOW (ref 134–144)
Total Protein: 7.5 g/dL (ref 6.0–8.5)

## 2020-01-10 LAB — PSA: Prostate Specific Ag, Serum: 0.5 ng/mL (ref 0.0–4.0)

## 2020-01-10 LAB — CBC WITH DIFFERENTIAL/PLATELET
Basophils Absolute: 0.1 10*3/uL (ref 0.0–0.2)
Basos: 1 %
EOS (ABSOLUTE): 0 10*3/uL (ref 0.0–0.4)
Eos: 1 %
Hematocrit: 40.7 % (ref 37.5–51.0)
Hemoglobin: 14.7 g/dL (ref 13.0–17.7)
Immature Grans (Abs): 0 10*3/uL (ref 0.0–0.1)
Immature Granulocytes: 0 %
Lymphocytes Absolute: 1.9 10*3/uL (ref 0.7–3.1)
Lymphs: 35 %
MCH: 33.5 pg — ABNORMAL HIGH (ref 26.6–33.0)
MCHC: 36.1 g/dL — ABNORMAL HIGH (ref 31.5–35.7)
MCV: 93 fL (ref 79–97)
Monocytes Absolute: 0.5 10*3/uL (ref 0.1–0.9)
Monocytes: 9 %
Neutrophils Absolute: 2.9 10*3/uL (ref 1.4–7.0)
Neutrophils: 54 %
Platelets: 253 10*3/uL (ref 150–450)
RBC: 4.39 x10E6/uL (ref 4.14–5.80)
RDW: 13 % (ref 11.6–15.4)
WBC: 5.3 10*3/uL (ref 3.4–10.8)

## 2020-01-10 LAB — LIPID PANEL W/O CHOL/HDL RATIO
Cholesterol, Total: 199 mg/dL (ref 100–199)
HDL: 50 mg/dL (ref >39)
LDL Chol Calc (NIH): 110 mg/dL — ABNORMAL HIGH (ref 0–99)
Triglycerides: 224 mg/dL — ABNORMAL HIGH (ref 0–149)
VLDL Cholesterol Cal: 39 mg/dL (ref 5–40)

## 2020-01-10 LAB — TSH: TSH: 1.68 u[IU]/mL (ref 0.450–4.500)

## 2020-01-12 ENCOUNTER — Encounter: Payer: Self-pay | Admitting: Family Medicine

## 2020-03-10 ENCOUNTER — Encounter: Payer: Self-pay | Admitting: *Deleted

## 2020-03-10 ENCOUNTER — Ambulatory Visit: Payer: Self-pay

## 2020-03-10 ENCOUNTER — Telehealth: Payer: Self-pay | Admitting: *Deleted

## 2020-03-10 NOTE — Telephone Encounter (Signed)
Attempted to call pt.  Left vm to return call to the office to discuss his symptoms.  Since there has been 3 attempts to contact the pt., will route note to Provider, to make her aware of pt's. Call, and unsuccessful attempts to contact him by phone.

## 2020-03-10 NOTE — Telephone Encounter (Signed)
Copied from [place] [id number]. Topic: General - Other >> Mar 10, 2020 11:22 AM Leward Quan A wrote: Reason for CRM: Patient called and ask to talk to the nurse about an issue with his right foot swelling on the inside of ankle and very painful to the point it can not bear any pressure. Just started about a month now at least 4 times. Would like a call back to discuss what he can do.  Ph# [phone]

## 2020-03-10 NOTE — Telephone Encounter (Signed)
This encounter was created in error - please disregard.

## 2020-03-10 NOTE — Telephone Encounter (Signed)
See triage encounter, patient triaged.

## 2020-03-10 NOTE — Telephone Encounter (Signed)
Patient called and says he's been having off and on right foot pain near the ankle and swelling 4 times in the last month. He says it's when he wakes up in the morning and starts to walk that the pain is a 10. He says the swelling happens when the pain is there and there is redness. Now he says the pain is a 1, no swelling, no redness. He says he's been told to get it checked out, so he called for an appointment. He denies any other symptoms. Appointment scheduled for Monday, 03/15/20 at 1340 with Dr. Wynetta Emery. Care advice given, he verbalized understanding.  Reason for Disposition . [1] MODERATE pain (e.g., interferes with normal activities, limping) AND [2] present > 3 days  Answer Assessment - Initial Assessment Questions 1. ONSET: "When did the pain start?"      1 month ago, 4 times in the morning 2. LOCATION: "Where is the pain located?"      Inside below the ankle to the right foot 3. PAIN: "How bad is the pain?"    (Scale 1-10; or mild, moderate, severe)   -  MILD (1-3): doesn't interfere with normal activities    -  MODERATE (4-7): interferes with normal activities (e.g., work or school) or awakens from sleep, limping    -  SEVERE (8-10): excruciating pain, unable to do any normal activities, unable to walk     When it happens a 10; now a 1 4. WORK OR EXERCISE: "Has there been any recent work or exercise that involved this part of the body?"      No 5. CAUSE: "What do you think is causing the foot pain?"     Unknown 6. OTHER SYMPTOMS: "Do you have any other symptoms?" (e.g., leg pain, rash, fever, numbness)     Ankle swelling with the pain gets bad, redness in the area when swollen not now. 7. PREGNANCY: "Is there any chance you are pregnant?" "When was your last menstrual period?"     N/A  Protocols used: FOOT PAIN-A-AH

## 2020-03-10 NOTE — Telephone Encounter (Signed)
Attempted to reach pt, left VM at number provided to CB.

## 2020-03-10 NOTE — Telephone Encounter (Signed)
Attempted to reach pt. Left message to call back and discuss his foot and ankle pain.

## 2020-03-15 ENCOUNTER — Ambulatory Visit
Admission: RE | Admit: 2020-03-15 | Discharge: 2020-03-15 | Disposition: A | Discharge status: Home / Self Care | Payer: Medicare Other | Source: Ambulatory Visit | Attending: Family Medicine | Admitting: Family Medicine

## 2020-03-15 ENCOUNTER — Ambulatory Visit
Admission: RE | Admit: 2020-03-15 | Discharge: 2020-03-15 | Disposition: A | Discharge status: Home / Self Care | Payer: Medicare Other | Attending: Family Medicine | Admitting: Family Medicine

## 2020-03-15 ENCOUNTER — Ambulatory Visit (INDEPENDENT_AMBULATORY_CARE_PROVIDER_SITE_OTHER): Payer: Medicare Other | Admitting: Family Medicine

## 2020-03-15 ENCOUNTER — Other Ambulatory Visit: Payer: Self-pay

## 2020-03-15 ENCOUNTER — Encounter: Payer: Self-pay | Admitting: Family Medicine

## 2020-03-15 VITALS — BP 126/72 | HR 55 | Temp 98.4°F | Wt 176.0 lb

## 2020-03-15 DIAGNOSIS — M25571 Pain in right ankle and joints of right foot: Secondary | ICD-10-CM

## 2020-03-15 DIAGNOSIS — M7731 Calcaneal spur, right foot: Secondary | ICD-10-CM | POA: Diagnosis not present

## 2020-03-15 NOTE — Progress Notes (Signed)
BP 126/72   Pulse (!) 55   Temp 98.4 F (36.9 C) (Oral)   Wt 176 lb (79.8 kg)   SpO2 96%   BMI 26.98 kg/m    Subjective:    Patient ID: Zachary Hansen, male    DOB: 08-17-1941, 79 y.o.   MRN: [id number]  HPI: Zachary Hansen is a 79 y.o. male  Chief Complaint  Patient presents with  . Foot Pain    right foot x about a month   FOOT PAIN Duration: about a month Involved foot: right Mechanism of injury: unknown Location: below medial malleolus  Onset: sudden  Severity: severe  Quality:  aching Frequency: intermittent Radiation: no Aggravating factors: weight bearing  Alleviating factors: NSAIDs  Status: fluctuating Treatments attempted: aleve  Relief with NSAIDs?:  significant Weakness with weight bearing or walking: no Morning stiffness: no Swelling: yes Redness: yes Bruising: no Paresthesias / decreased sensation: no  Fevers:no   Relevant past medical, surgical, family and social history reviewed and updated as indicated. Interim medical history since our last visit reviewed. Allergies and medications reviewed and updated.  Review of Systems  Constitutional: Negative.   Respiratory: Negative.   Cardiovascular: Negative.   Musculoskeletal: Positive for arthralgias. Negative for back pain, gait problem, joint swelling, myalgias, neck pain and neck stiffness.  Neurological: Negative.   Psychiatric/Behavioral: Negative.     Per HPI unless specifically indicated above     Objective:    BP 126/72   Pulse (!) 55   Temp 98.4 F (36.9 C) (Oral)   Wt 176 lb (79.8 kg)   SpO2 96%   BMI 26.98 kg/m   Wt Readings from Last 3 Encounters:  03/15/20 176 lb (79.8 kg)  01/09/20 177 lb 2 oz (80.3 kg)  03/14/19 177 lb (80.3 kg)    Physical Exam Vitals and nursing note reviewed.  Constitutional:      General: He is not in acute distress.    Appearance: Normal appearance. He is not ill-appearing, toxic-appearing or diaphoretic.  HENT:     Head:  Normocephalic and atraumatic.     Right Ear: External ear normal.     Left Ear: External ear normal.     Nose: Nose normal.     Mouth/Throat:     Mouth: Mucous membranes are moist.     Pharynx: Oropharynx is clear.  Eyes:     General: No scleral icterus.       Right eye: No discharge.        Left eye: No discharge.     Extraocular Movements: Extraocular movements intact.     Conjunctiva/sclera: Conjunctivae normal.     Pupils: Pupils are equal, round, and reactive to light.  Cardiovascular:     Rate and Rhythm: Normal rate and regular rhythm.     Pulses: Normal pulses.     Heart sounds: Normal heart sounds. No murmur heard.  No friction rub. No gallop.   Pulmonary:     Effort: Pulmonary effort is normal. No respiratory distress.     Breath sounds: Normal breath sounds. No stridor. No wheezing, rhonchi or rales.  Chest:     Chest wall: No tenderness.  Musculoskeletal:        General: No swelling, tenderness, deformity or signs of injury. Normal range of motion.     Cervical back: Normal range of motion and neck supple.     Right lower leg: No edema.     Left lower leg: No edema.  Comments: Tenderness under medial malleolus on the R   Skin:    General: Skin is warm and dry.     Capillary Refill: Capillary refill takes less than 2 seconds.     Coloration: Skin is not jaundiced or pale.     Findings: No bruising, erythema, lesion or rash.  Neurological:     General: No focal deficit present.     Mental Status: He is alert and oriented to person, place, and time. Mental status is at baseline.  Psychiatric:        Mood and Affect: Mood normal.        Behavior: Behavior normal.        Thought Content: Thought content normal.        Judgment: Judgment normal.     Results for orders placed or performed in visit on 01/09/20  CBC with Differential/Platelet  Result Value Ref Range   WBC 5.3 3.4 - 10.8 x10E3/uL   RBC 4.39 4.14 - 5.80 x10E6/uL   Hemoglobin 14.7 13.0 - 17.7  g/dL   Hematocrit 40.7 37.5 - 51.0 %   MCV 93 79 - 97 fL   MCH 33.5 (H) 26.6 - 33.0 pg   MCHC 36.1 (H) 31 - 35 g/dL   RDW 13.0 11.6 - 15.4 %   Platelets 253 150 - 450 x10E3/uL   Neutrophils 54 Not Estab. %   Lymphs 35 Not Estab. %   Monocytes 9 Not Estab. %   Eos 1 Not Estab. %   Basos 1 Not Estab. %   Neutrophils Absolute 2.9 1 - 7 x10E3/uL   Lymphocytes Absolute 1.9 0 - 3 x10E3/uL   Monocytes Absolute 0.5 0 - 0 x10E3/uL   EOS (ABSOLUTE) 0.0 0.0 - 0.4 x10E3/uL   Basophils Absolute 0.1 0 - 0 x10E3/uL   Immature Granulocytes 0 Not Estab. %   Immature Grans (Abs) 0.0 0.0 - 0.1 x10E3/uL  Comprehensive metabolic panel  Result Value Ref Range   Glucose 98 65 - 99 mg/dL   BUN 17 8 - 27 mg/dL   Creatinine, Ser 1.15 0.76 - 1.27 mg/dL   GFR calc non Af Amer 61 >59 mL/min/1.73   GFR calc Af Amer 71 >59 mL/min/1.73   BUN/Creatinine Ratio 15 10 - 24   Sodium 133 (L) 134 - 144 mmol/L   Potassium 3.8 3.5 - 5.2 mmol/L   Chloride 95 (L) 96 - 106 mmol/L   CO2 22 20 - 29 mmol/L   Calcium 9.5 8.6 - 10.2 mg/dL   Total Protein 7.5 6.0 - 8.5 g/dL   Albumin 4.7 3.7 - 4.7 g/dL   Globulin, Total 2.8 1.5 - 4.5 g/dL   Albumin/Globulin Ratio 1.7 1.2 - 2.2   Bilirubin Total 0.7 0.0 - 1.2 mg/dL   Alkaline Phosphatase 98 39 - 117 IU/L   AST 19 0 - 40 IU/L   ALT 20 0 - 44 IU/L  Lipid Panel w/o Chol/HDL Ratio  Result Value Ref Range   Cholesterol, Total 199 100 - 199 mg/dL   Triglycerides 224 (H) 0 - 149 mg/dL   HDL 50 >39 mg/dL   VLDL Cholesterol Cal 39 5 - 40 mg/dL   LDL Chol Calc (NIH) 110 (H) 0 - 99 mg/dL  Microalbumin, Urine Waived  Result Value Ref Range   Microalb, Ur Waived 30 (H) 0 - 19 mg/L   Creatinine, Urine Waived 100 10 - 300 mg/dL   Microalb/Creat Ratio <30 <30 mg/g  PSA  Result Value Ref  Range   Prostate Specific Ag, Serum 0.5 0.0 - 4.0 ng/mL  TSH  Result Value Ref Range   TSH 1.680 0.450 - 4.500 uIU/mL  UA/M w/rflx Culture, Routine   Specimen: Urine   URINE  Result Value  Ref Range   Specific Gravity, UA 1.020 1.005 - 1.030   pH, UA 7.0 5.0 - 7.5   Color, UA Yellow Yellow   Appearance Ur Clear Clear   Leukocytes,UA Negative Negative   Protein,UA Negative Negative/Trace   Glucose, UA Negative Negative   Ketones, UA Negative Negative   RBC, UA Negative Negative   Bilirubin, UA Negative Negative   Urobilinogen, Ur 1.0 0.2 - 1.0 mg/dL   Nitrite, UA Negative Negative      Assessment & Plan:   Problem List Items Addressed This Visit    None    Visit Diagnoses    Acute right ankle pain    -  Primary   Will check x-rays and uric acid. Await results. Continue naproxen and start stretches. Call with any concerns.    Relevant Orders   Comprehensive metabolic panel   Uric acid   DG Foot Complete Right   DG Ankle Complete Right       Follow up plan: Return if symptoms worsen or fail to improve.

## 2020-03-15 NOTE — Patient Instructions (Addendum)
Ankle Exercises Ask your health care provider which exercises are safe for you. Do exercises exactly as told by your health care provider and adjust them as directed. It is normal to feel mild stretching, pulling, tightness, or mild discomfort as you do these exercises. Stop right away if you feel sudden pain or your pain gets worse. Do not begin these exercises until told by your health care provider. Stretching and range-of-motion exercises These exercises warm up your muscles and joints and improve the movement and flexibility of your ankle. These exercises may also help to relieve pain. Dorsiflexion/plantar flexion  1. Sit with your __________ knee straight or bent. Do not rest your foot on anything. 2. Flex your __________ ankle to tilt the top of your foot toward your shin. This is called dorsiflexion. 3. Hold this position for __________ seconds. 4. Point your toes downward to tilt the top of your foot away from your shin. This is called plantar flexion. 5. Hold this position for __________ seconds. Repeat __________ times. Complete this exercise __________ times a day. Ankle alphabet  1. Sit with your __________ foot supported at your lower leg. ? Do not rest your foot on anything. ? Make sure your foot has room to move freely. 2. Think of your __________ foot as a paintbrush: ? Move your foot to trace each letter of the alphabet in the air. Keep your hip and knee still while you trace the letters. Trace every letter from A to Z. ? Make the letters as large as you can without causing or increasing any discomfort. Repeat __________ times. Complete this exercise __________ times a day. Passive ankle dorsiflexion This is an exercise in which something or someone moves your ankle for you. You do not move it yourself. 1. Sit on a chair that is placed on a non-carpeted surface. 2. Place your __________ foot on the floor, directly under your __________ knee. Extend your __________ leg for  support. 3. Keeping your heel down, slide your __________ foot back toward the chair until you feel a stretch at your ankle or calf. If you do not feel a stretch, slide your buttocks forward to the edge of the chair while keeping your heel down. 4. Hold this stretch for __________ seconds. Repeat __________ times. Complete this exercise __________ times a day. Strengthening exercises These exercises build strength and endurance in your ankle. Endurance is the ability to use your muscles for a long time, even after they get tired. Dorsiflexors These are muscles that lift your foot up. 1. Secure a rubber exercise band or tube to an object, such as a table leg, that will stay still when the band is pulled. Secure the other end around your __________ foot. 2. Sit on the floor, facing the object with your __________ leg extended. The band or tube should be slightly tense when your foot is relaxed. 3. Slowly flex your __________ ankle and toes to bring your foot toward your shin. 4. Hold this position for __________ seconds. 5. Slowly return your foot to the starting position, controlling the band as you do that. Repeat __________ times. Complete this exercise __________ times a day. Plantar flexors These are muscles that push your foot down. 1. Sit on the floor with your __________ leg extended. 2. Loop a rubber exercise band or tube around the ball of your __________ foot. The ball of your foot is on the walking surface, right under your toes. The band or tube should be slightly tense when your  foot is relaxed. 3. Slowly point your toes downward, pushing them away from you. 4. Hold this position for __________ seconds. 5. Slowly release the tension in the band or tube, controlling smoothly until your foot is back in the starting position. Repeat __________ times. Complete this exercise __________ times a day. Towel curls  1. Sit in a chair on a non-carpeted surface, and put your feet on the  floor. 2. Place a towel in front of your feet. If told by your health care provider, add a __________ pound weight to the end of the towel. 3. Keeping your heel on the floor, put your __________ foot on the towel. 4. Pull the towel toward you by grabbing the towel with your toes and curling them under. Keep your heel on the floor. 5. Let your toes relax. 6. Grab the towel again. Keep pulling the towel until it is completely underneath your foot. Repeat __________ times. Complete this exercise __________ times a day. Standing plantar flexion This is an exercise in which you use your toes to lift your body's weight while standing. 1. Stand with your feet shoulder-width apart. 2. Keep your weight spread evenly over the width of your feet while you rise up on your toes. Use a wall or table to steady yourself if needed, but try not to use it for support. 3. If this exercise is too easy, try these options: ? Shift your weight toward your __________ leg until you feel challenged. ? If told by your health care provider, lift your uninjured leg off the floor. 4. Hold this position for __________ seconds. Repeat __________ times. Complete this exercise __________ times a day. Tandem walking 1. Stand with one foot directly in front of the other. 2. Slowly raise your back foot up, lifting your heel before your toes, and place it directly in front of your other foot. 3. Continue to walk in this heel-to-toe way for __________ or for as long as told by your health care provider. Have a countertop or wall nearby to use if needed to keep your balance, but try not to hold onto anything for support. Repeat __________ times. Complete this exercise __________ times a day. This information is not intended to replace advice given to you by your health care provider. Make sure you discuss any questions you have with your health care provider. Document Revised: 06/15/2018 Document Reviewed: 06/17/2018 Elsevier Patient  Education  2020 [employer]  

## 2020-03-16 LAB — COMPREHENSIVE METABOLIC PANEL
ALT: 19 IU/L (ref 0–44)
AST: 21 IU/L (ref 0–40)
Albumin/Globulin Ratio: 2 (ref 1.2–2.2)
Albumin: 4.7 g/dL (ref 3.7–4.7)
Alkaline Phosphatase: 96 IU/L (ref 48–121)
BUN/Creatinine Ratio: 21 (ref 10–24)
BUN: 22 mg/dL (ref 8–27)
Bilirubin Total: 0.5 mg/dL (ref 0.0–1.2)
CO2: 26 mmol/L (ref 20–29)
Calcium: 9.6 mg/dL (ref 8.6–10.2)
Chloride: 99 mmol/L (ref 96–106)
Creatinine, Ser: 1.03 mg/dL (ref 0.76–1.27)
GFR calc Af Amer: 81 mL/min/{1.73_m2} (ref >59)
GFR calc non Af Amer: 70 mL/min/{1.73_m2} (ref >59)
Globulin, Total: 2.3 g/dL (ref 1.5–4.5)
Glucose: 90 mg/dL (ref 65–99)
Potassium: 3.9 mmol/L (ref 3.5–5.2)
Sodium: 139 mmol/L (ref 134–144)
Total Protein: 7 g/dL (ref 6.0–8.5)

## 2020-03-16 LAB — URIC ACID: Uric Acid: 7.6 mg/dL (ref 3.8–8.4)

## 2020-03-17 DIAGNOSIS — H43813 Vitreous degeneration, bilateral: Secondary | ICD-10-CM | POA: Diagnosis not present

## 2020-03-17 DIAGNOSIS — H2513 Age-related nuclear cataract, bilateral: Secondary | ICD-10-CM | POA: Diagnosis not present

## 2020-03-17 DIAGNOSIS — H5203 Hypermetropia, bilateral: Secondary | ICD-10-CM | POA: Diagnosis not present

## 2020-03-17 DIAGNOSIS — H52223 Regular astigmatism, bilateral: Secondary | ICD-10-CM | POA: Diagnosis not present

## 2020-03-17 DIAGNOSIS — H524 Presbyopia: Secondary | ICD-10-CM | POA: Diagnosis not present

## 2020-07-11 ENCOUNTER — Other Ambulatory Visit: Payer: Self-pay | Admitting: Family Medicine

## 2020-07-12 ENCOUNTER — Encounter: Payer: Self-pay | Admitting: Family Medicine

## 2020-07-12 ENCOUNTER — Ambulatory Visit (INDEPENDENT_AMBULATORY_CARE_PROVIDER_SITE_OTHER): Payer: Medicare Other | Admitting: Family Medicine

## 2020-07-12 ENCOUNTER — Other Ambulatory Visit: Payer: Self-pay

## 2020-07-12 VITALS — BP 119/77 | HR 55 | Temp 98.6°F | Ht 68.0 in | Wt 174.0 lb

## 2020-07-12 DIAGNOSIS — F324 Major depressive disorder, single episode, in partial remission: Secondary | ICD-10-CM | POA: Diagnosis not present

## 2020-07-12 DIAGNOSIS — I129 Hypertensive chronic kidney disease with stage 1 through stage 4 chronic kidney disease, or unspecified chronic kidney disease: Secondary | ICD-10-CM | POA: Diagnosis not present

## 2020-07-12 DIAGNOSIS — Z1159 Encounter for screening for other viral diseases: Secondary | ICD-10-CM | POA: Diagnosis not present

## 2020-07-12 DIAGNOSIS — E782 Mixed hyperlipidemia: Secondary | ICD-10-CM

## 2020-07-12 DIAGNOSIS — C61 Malignant neoplasm of prostate: Secondary | ICD-10-CM

## 2020-07-12 MED ORDER — AMLODIPINE BESYLATE 5 MG PO TABS: 5.0000 mg | ORAL_TABLET | Freq: Every day | ORAL | 1 refills | Status: DC

## 2020-07-12 MED ORDER — HYDROCHLOROTHIAZIDE 25 MG PO TABS: 25.0000 mg | ORAL_TABLET | Freq: Every day | ORAL | 1 refills | Status: DC

## 2020-07-12 MED ORDER — FLUOXETINE HCL 40 MG PO CAPS: ORAL_CAPSULE | ORAL | 1 refills | Status: DC

## 2020-07-12 MED ORDER — VALSARTAN 40 MG PO TABS: 80.0000 mg | ORAL_TABLET | Freq: Every day | ORAL | 1 refills | Status: DC

## 2020-07-12 MED ORDER — CYCLOBENZAPRINE HCL 10 MG PO TABS: ORAL_TABLET | ORAL | 1 refills | Status: DC

## 2020-07-12 NOTE — Assessment & Plan Note (Signed)
Rechecking labs today. Await results. Treat as needed.  °

## 2020-07-12 NOTE — Progress Notes (Signed)
BP 119/77   Pulse (!) 55   Temp 98.6 F (37 C) (Oral)   Ht 5\' 8"  (1.727 m)   Wt 174 lb (78.9 kg)   SpO2 98%   BMI 26.46 kg/m    Subjective:    Patient ID: Zachary Hansen, male    DOB: 05/05/1941, 79 y.o.   MRN: [id number]  HPI: Zachary Hansen is a 79 y.o. male  Chief Complaint  Patient presents with  . Hyperlipidemia  . Hypertension  . Depression   HYPERTENSION / HYPERLIPIDEMIA Satisfied with current treatment? yes Duration of hypertension: chronic BP monitoring frequency: not checking BP medication side effects: no Past BP meds: amlodipine, HCTZ, valsartan Duration of hyperlipidemia: chronic Cholesterol medication side effects: not on anything Cholesterol supplements: none Past cholesterol medications: none Medication compliance: excellent compliance Aspirin: no Recent stressors: no Recurrent headaches: no Visual changes: no Palpitations: no Dyspnea: no Chest pain: no Lower extremity edema: no Dizzy/lightheaded: no  DEPRESSION Mood status: stable Satisfied with current treatment?: yes Symptom severity: moderate  Duration of current treatment : chronic Side effects: no Medication compliance: excellent compliance Psychotherapy/counseling: no  Previous psychiatric medications: fluoxetine Depressed mood: yes Anxious mood: yes Anhedonia: no Significant weight loss or gain: no Insomnia: no  Fatigue: yes Feelings of worthlessness or guilt: no Impaired concentration/indecisiveness: no Suicidal ideations: no Hopelessness: no Crying spells: no Depression screen [facility] 2/9 07/12/2020 01/09/2020 01/09/2020 02/11/2019 05/28/2018  Decreased Interest 2 1 1  0 1  Down, Depressed, Hopeless 2 0 0 0 1  PHQ - 2 Score 4 1 1  0 2  Altered sleeping 2 2 2 1 3   Tired, decreased energy 1 2 2 1 3   Change in appetite 0 1 1 0 0  Feeling bad or failure about yourself  1 0 0 0 0  Trouble concentrating 0 0 0 0 0  Moving slowly or fidgety/restless 1 0 0 0 0  Suicidal thoughts 0  0 0 0 0  PHQ-9 Score 9 6 6 2 8   Difficult doing work/chores Somewhat difficult Not difficult at all Not difficult at all Not difficult at all Not difficult at all  Some recent data might be hidden   GAD 7 : Generalized Anxiety Score 07/12/2020 05/28/2018  Nervous, Anxious, on Edge 1 1  Control/stop worrying 1 0  Worry too much - different things 1 1  Trouble relaxing 0 0  Restless 0 0  Easily annoyed or irritable 0 1  Afraid - awful might happen 1 0  Total GAD 7 Score 4 3  Anxiety Difficulty Not difficult at all Somewhat difficult    Relevant past medical, surgical, family and social history reviewed and updated as indicated. Interim medical history since our last visit reviewed. Allergies and medications reviewed and updated.  Review of Systems  Constitutional: Negative.   Respiratory: Negative.   Cardiovascular: Negative.   Gastrointestinal: Negative.   Musculoskeletal: Negative.   Skin: Negative.   Psychiatric/Behavioral: Positive for dysphoric mood. Negative for agitation, behavioral problems, confusion, decreased concentration, hallucinations, self-injury, sleep disturbance and suicidal ideas. The patient is not nervous/anxious and is not hyperactive.     Per HPI unless specifically indicated above     Objective:    BP 119/77   Pulse (!) 55   Temp 98.6 F (37 C) (Oral)   Ht 5\' 8"  (1.727 m)   Wt 174 lb (78.9 kg)   SpO2 98%   BMI 26.46 kg/m   Wt Readings from Last 3 Encounters:  07/12/20 174  lb (78.9 kg)  03/15/20 176 lb (79.8 kg)  01/09/20 177 lb 2 oz (80.3 kg)    Physical Exam Vitals and nursing note reviewed.  Constitutional:      General: He is not in acute distress.    Appearance: Normal appearance. He is not ill-appearing, toxic-appearing or diaphoretic.  HENT:     Head: Normocephalic and atraumatic.     Right Ear: External ear normal.     Left Ear: External ear normal.     Nose: Nose normal.     Mouth/Throat:     Mouth: Mucous membranes are moist.       Pharynx: Oropharynx is clear.  Eyes:     General: No scleral icterus.       Right eye: No discharge.        Left eye: No discharge.     Extraocular Movements: Extraocular movements intact.     Conjunctiva/sclera: Conjunctivae normal.     Pupils: Pupils are equal, round, and reactive to light.  Cardiovascular:     Rate and Rhythm: Normal rate and regular rhythm.     Pulses: Normal pulses.     Heart sounds: Normal heart sounds. No murmur heard.  No friction rub. No gallop.   Pulmonary:     Effort: Pulmonary effort is normal. No respiratory distress.     Breath sounds: Normal breath sounds. No stridor. No wheezing, rhonchi or rales.  Chest:     Chest wall: No tenderness.  Musculoskeletal:        General: Normal range of motion.     Cervical back: Normal range of motion and neck supple.  Skin:    General: Skin is warm and dry.     Capillary Refill: Capillary refill takes less than 2 seconds.     Coloration: Skin is not jaundiced or pale.     Findings: No bruising, erythema, lesion or rash.  Neurological:     General: No focal deficit present.     Mental Status: He is alert and oriented to person, place, and time. Mental status is at baseline.  Psychiatric:        Mood and Affect: Mood normal.        Behavior: Behavior normal.        Thought Content: Thought content normal.        Judgment: Judgment normal.     Results for orders placed or performed in visit on 03/15/20  Comprehensive metabolic panel  Result Value Ref Range   Glucose 90 65 - 99 mg/dL   BUN 22 8 - 27 mg/dL   Creatinine, Ser 1.03 0.76 - 1.27 mg/dL   GFR calc non Af Amer 70 >59 mL/min/1.73   GFR calc Af Amer 81 >59 mL/min/1.73   BUN/Creatinine Ratio 21 10 - 24   Sodium 139 134 - 144 mmol/L   Potassium 3.9 3.5 - 5.2 mmol/L   Chloride 99 96 - 106 mmol/L   CO2 26 20 - 29 mmol/L   Calcium 9.6 8.6 - 10.2 mg/dL   Total Protein 7.0 6.0 - 8.5 g/dL   Albumin 4.7 3.7 - 4.7 g/dL   Globulin, Total 2.3 1.5 - 4.5  g/dL   Albumin/Globulin Ratio 2.0 1.2 - 2.2   Bilirubin Total 0.5 0.0 - 1.2 mg/dL   Alkaline Phosphatase 96 48 - 121 IU/L   AST 21 0 - 40 IU/L   ALT 19 0 - 44 IU/L  Uric acid  Result Value Ref Range   Uric Acid  7.6 3.8 - 8.4 mg/dL      Assessment & Plan:   Problem List Items Addressed This Visit      Genitourinary   Benign hypertensive renal disease    Under good control on current regimen. Continue current regimen. Continue to monitor. Call with any concerns. Refills given. Labs drawn today.       Relevant Orders   Comprehensive metabolic panel   Malignant neoplasm of prostate ([place])    Checking PSA today. Follows with the [place]. Call with any concerns.       Relevant Orders   PSA     Other   Depression, major, single episode, in partial remission ([place])    Not doing great. Not interested in increasing medicine right now. Call with any concerns or if getting worse. Refills given today.       Relevant Medications   FLUoxetine (PROZAC) 40 MG capsule   Hyperlipidemia - Primary    Rechecking labs today. Await results. Treat as needed.       Relevant Medications   valsartan (DIOVAN) 40 MG tablet   hydrochlorothiazide (HYDRODIURIL) 25 MG tablet   amLODipine (NORVASC) 5 MG tablet   Other Relevant Orders   Comprehensive metabolic panel   Lipid Panel w/o Chol/HDL Ratio    Other Visit Diagnoses    Need for hepatitis C screening test       Labs drawn today. Await results.    Relevant Orders   Hepatitis C Antibody       Follow up plan: Return in about 6 months (around 01/10/2021) for Physical.

## 2020-07-12 NOTE — Assessment & Plan Note (Signed)
Not doing great. Not interested in increasing medicine right now. Call with any concerns or if getting worse. Refills given today.

## 2020-07-12 NOTE — Assessment & Plan Note (Signed)
Under good control on current regimen. Continue current regimen. Continue to monitor. Call with any concerns. Refills given. Labs drawn today.   

## 2020-07-12 NOTE — Patient Instructions (Signed)
shingrix- shingles shot

## 2020-07-12 NOTE — Assessment & Plan Note (Signed)
Checking PSA today. Follows with the [place]. Call with any concerns.

## 2020-07-13 LAB — LIPID PANEL W/O CHOL/HDL RATIO
Cholesterol, Total: 222 mg/dL — ABNORMAL HIGH (ref 100–199)
HDL: 55 mg/dL (ref >39)
LDL Chol Calc (NIH): 148 mg/dL — ABNORMAL HIGH (ref 0–99)
Triglycerides: 108 mg/dL (ref 0–149)
VLDL Cholesterol Cal: 19 mg/dL (ref 5–40)

## 2020-07-13 LAB — COMPREHENSIVE METABOLIC PANEL
ALT: 14 IU/L (ref 0–44)
AST: 16 IU/L (ref 0–40)
Albumin/Globulin Ratio: 1.7 (ref 1.2–2.2)
Albumin: 4.7 g/dL (ref 3.7–4.7)
Alkaline Phosphatase: 98 IU/L (ref 44–121)
BUN/Creatinine Ratio: 17 (ref 10–24)
BUN: 20 mg/dL (ref 8–27)
Bilirubin Total: 0.5 mg/dL (ref 0.0–1.2)
CO2: 25 mmol/L (ref 20–29)
Calcium: 9.8 mg/dL (ref 8.6–10.2)
Chloride: 97 mmol/L (ref 96–106)
Creatinine, Ser: 1.18 mg/dL (ref 0.76–1.27)
GFR calc Af Amer: 69 mL/min/{1.73_m2} (ref >59)
GFR calc non Af Amer: 60 mL/min/{1.73_m2} (ref >59)
Globulin, Total: 2.7 g/dL (ref 1.5–4.5)
Glucose: 128 mg/dL — ABNORMAL HIGH (ref 65–99)
Potassium: 3.7 mmol/L (ref 3.5–5.2)
Sodium: 136 mmol/L (ref 134–144)
Total Protein: 7.4 g/dL (ref 6.0–8.5)

## 2020-07-13 LAB — HEPATITIS C ANTIBODY: Hep C Virus Ab: <0.1 s/co ratio (ref 0.0–0.9)

## 2020-07-13 LAB — PSA: Prostate Specific Ag, Serum: 0.5 ng/mL (ref 0.0–4.0)

## 2020-07-14 ENCOUNTER — Encounter: Payer: Self-pay | Admitting: Family Medicine

## 2020-07-29 ENCOUNTER — Ambulatory Visit: Payer: Medicare Other | Admitting: Family Medicine

## 2020-11-21 IMAGING — DX DG FOOT COMPLETE 3+V*R*
3 series · 3 of 3 positions shown · non-contrast
Comparison: None.

CLINICAL DATA: Right ankle pain below the medial malleolus.
Intermittent flares.

EXAM:
RIGHT FOOT COMPLETE - 3+ VIEW

[foot ap]
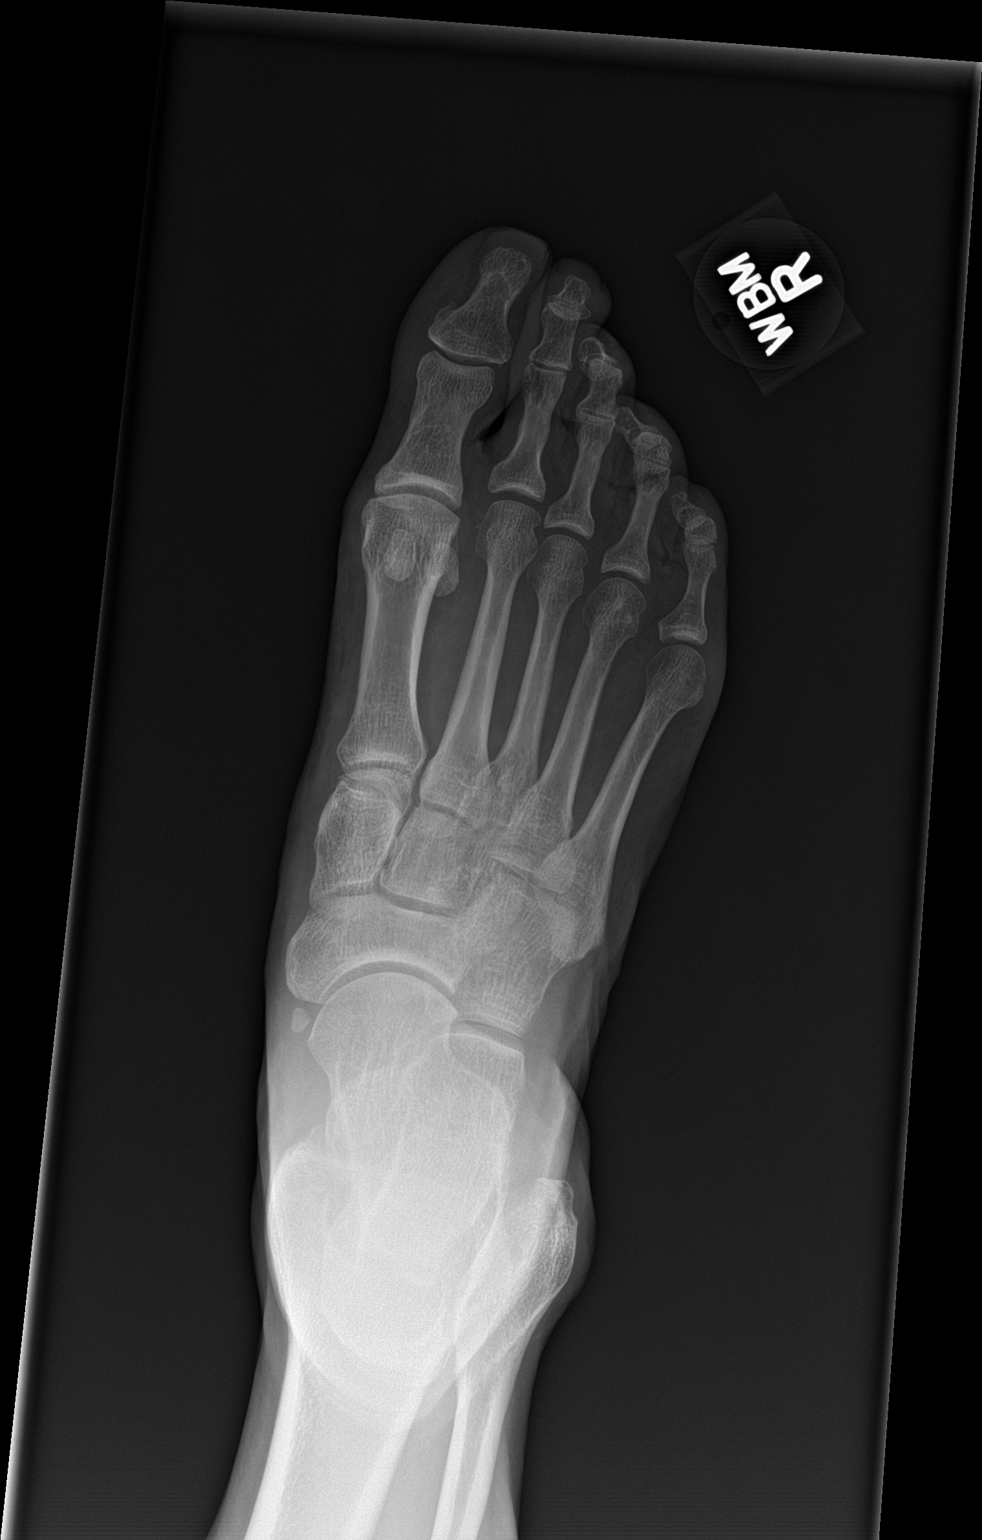

[foot obl]
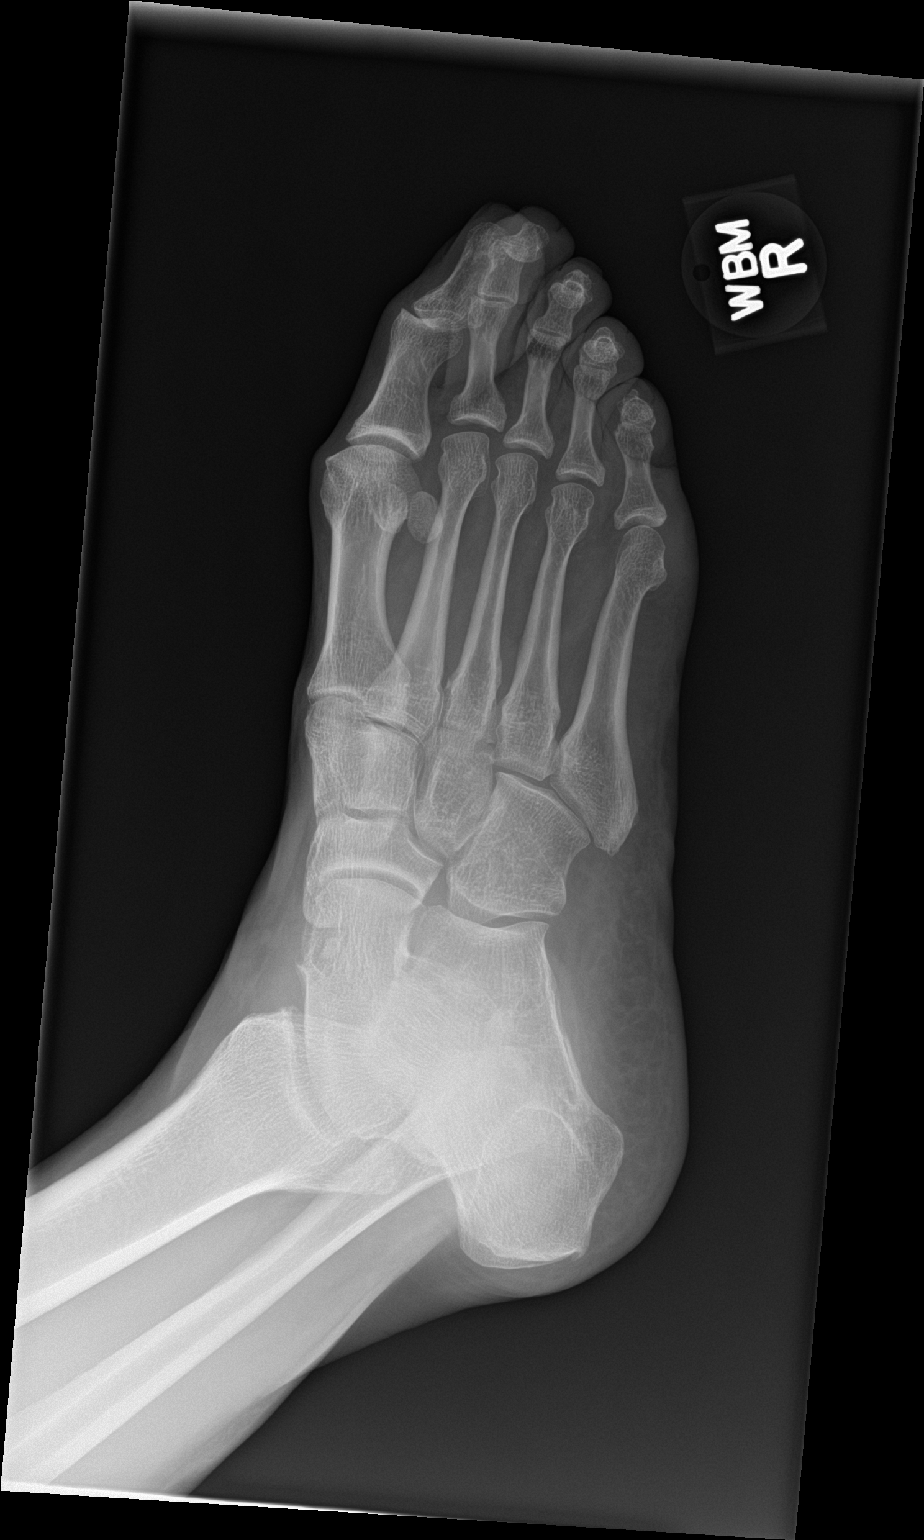

[foot lat]
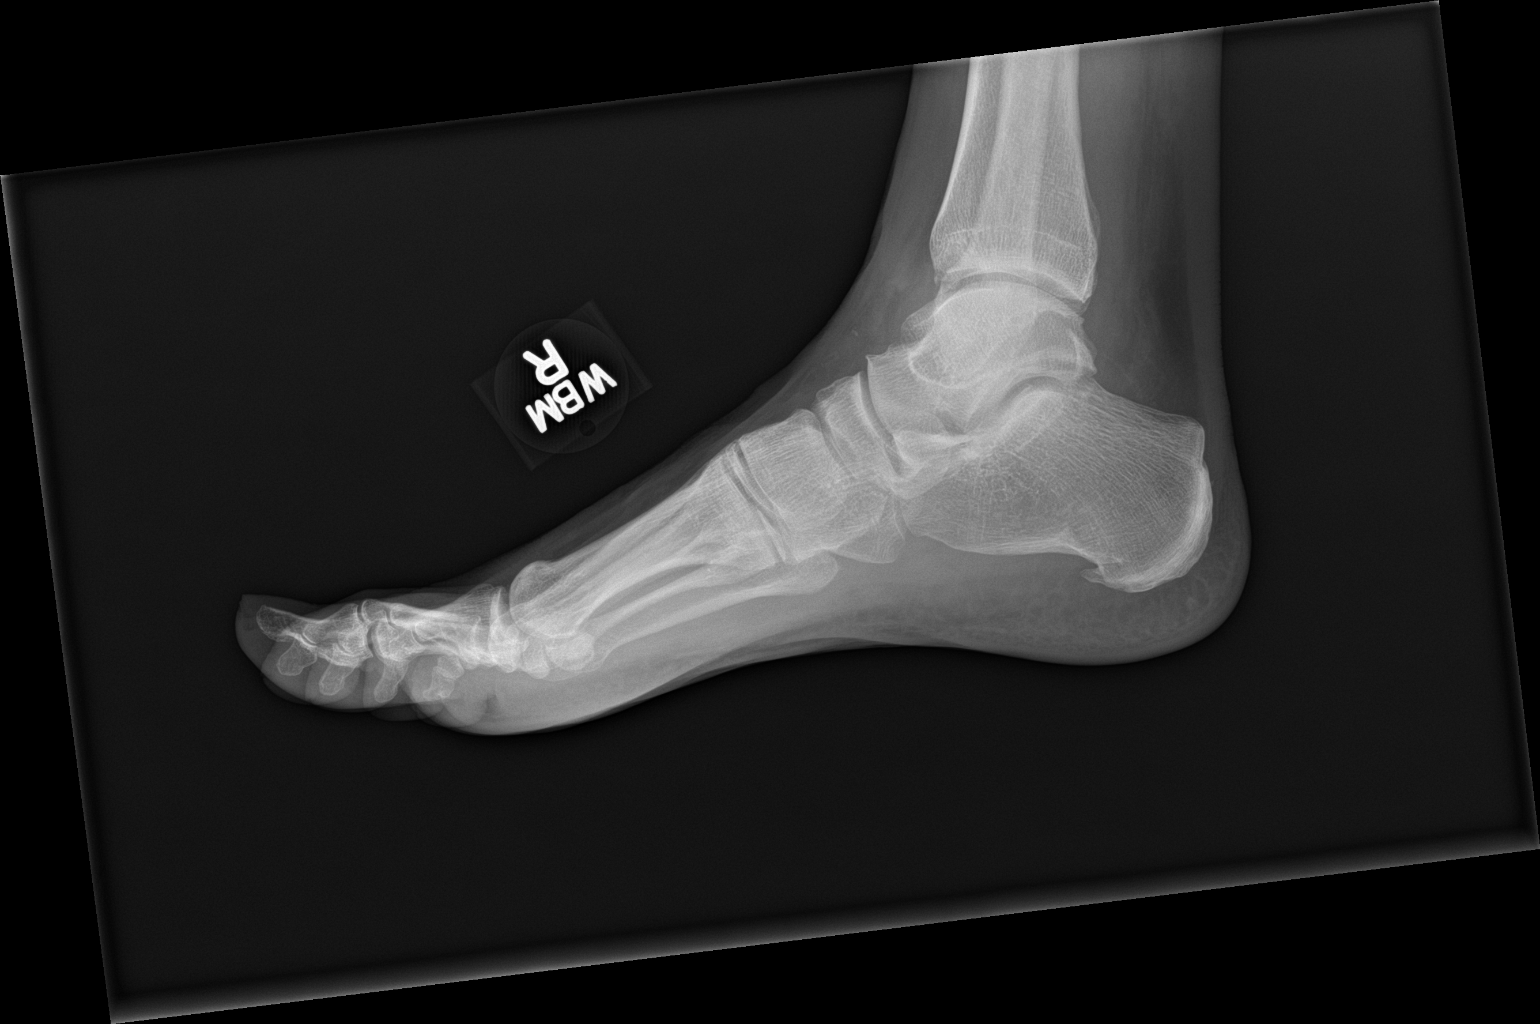

[3 of 3 positions shown; findings below may reference images not displayed]

FINDINGS: Joint effusion is seen on the lateral view. No joint space narrowing
or significant marginal erosion. No calcified soft tissue mass or
evident bone lesion. Plantar heel spur.
IMPRESSION: 1. No acute or focal finding in the foot.
2. Plantar heel spur.

## 2021-01-03 ENCOUNTER — Telehealth: Payer: Self-pay

## 2021-01-03 NOTE — Telephone Encounter (Signed)
Lvm to r/s 4/15 appt

## 2021-01-12 NOTE — Telephone Encounter (Signed)
Called pt 3 times no answer left vm

## 2021-01-14 ENCOUNTER — Encounter: Payer: Medicare Other | Admitting: Family Medicine

## 2021-01-19 ENCOUNTER — Other Ambulatory Visit: Payer: Self-pay | Admitting: Family Medicine
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
## 2021-01-25 ENCOUNTER — Ambulatory Visit (INDEPENDENT_AMBULATORY_CARE_PROVIDER_SITE_OTHER): Payer: Medicare Other | Admitting: Family Medicine

## 2021-01-25 ENCOUNTER — Other Ambulatory Visit: Payer: Self-pay

## 2021-01-25 ENCOUNTER — Encounter: Payer: Self-pay | Admitting: Family Medicine

## 2021-01-25 VITALS — BP 147/78 | HR 59 | Temp 98.2°F | Ht 68.0 in | Wt 182.8 lb

## 2021-01-25 DIAGNOSIS — F324 Major depressive disorder, single episode, in partial remission: Secondary | ICD-10-CM | POA: Diagnosis not present

## 2021-01-25 DIAGNOSIS — R42 Dizziness and giddiness: Secondary | ICD-10-CM

## 2021-01-25 DIAGNOSIS — Z0000 Encounter for general adult medical examination without abnormal findings: Secondary | ICD-10-CM

## 2021-01-25 DIAGNOSIS — I129 Hypertensive chronic kidney disease with stage 1 through stage 4 chronic kidney disease, or unspecified chronic kidney disease: Secondary | ICD-10-CM | POA: Diagnosis not present

## 2021-01-25 DIAGNOSIS — C61 Malignant neoplasm of prostate: Secondary | ICD-10-CM | POA: Diagnosis not present

## 2021-01-25 DIAGNOSIS — I440 Atrioventricular block, first degree: Secondary | ICD-10-CM | POA: Insufficient documentation

## 2021-01-25 DIAGNOSIS — R001 Bradycardia, unspecified: Secondary | ICD-10-CM

## 2021-01-25 DIAGNOSIS — E782 Mixed hyperlipidemia: Secondary | ICD-10-CM | POA: Diagnosis not present

## 2021-01-25 LAB — URINALYSIS, ROUTINE W REFLEX MICROSCOPIC
Bilirubin, UA: NEGATIVE
Glucose, UA: NEGATIVE
Ketones, UA: NEGATIVE
Leukocytes,UA: NEGATIVE
Nitrite, UA: NEGATIVE
Protein,UA: NEGATIVE
RBC, UA: NEGATIVE
Specific Gravity, UA: 1.025 (ref 1.005–1.030)
Urobilinogen, Ur: 0.2 mg/dL (ref 0.2–1.0)
pH, UA: 6 (ref 5.0–7.5)

## 2021-01-25 LAB — BAYER DCA HB A1C WAIVED: HB A1C (BAYER DCA - WAIVED): 5.9 % (ref <7.0)

## 2021-01-25 LAB — MICROALBUMIN, URINE WAIVED
Creatinine, Urine Waived: 300 mg/dL (ref 10–300)
Microalb, Ur Waived: 10 mg/L (ref 0–19)
Microalb/Creat Ratio: <30 mg/g (ref <30)

## 2021-01-25 MED ORDER — FLUOXETINE HCL 40 MG PO CAPS: ORAL_CAPSULE | ORAL | 1 refills | Status: DC

## 2021-01-25 MED ORDER — AMLODIPINE BESYLATE 5 MG PO TABS: 1.0000 | ORAL_TABLET | Freq: Every day | ORAL | 1 refills | Status: DC

## 2021-01-25 MED ORDER — VALSARTAN 40 MG PO TABS: 80.0000 mg | ORAL_TABLET | Freq: Every day | ORAL | 1 refills | Status: DC

## 2021-01-25 MED ORDER — HYDROCHLOROTHIAZIDE 25 MG PO TABS: 25.0000 mg | ORAL_TABLET | Freq: Every day | ORAL | 1 refills | Status: DC

## 2021-01-25 NOTE — Assessment & Plan Note (Signed)
Found on EKG today- no previous available for comparison. Patient has been having dizzy spells and near syncope. Will get him into cardiology. Referral generated today. Call with any concerns.

## 2021-01-25 NOTE — Assessment & Plan Note (Signed)
Not on any medications that should be dropping him. Has seen cardiology previously, but now with AV block and dizziness- will get back into see cardiology. Call with any concerns.

## 2021-01-25 NOTE — Assessment & Plan Note (Signed)
Rechecking labs today. Await results. Treat as needed.  °

## 2021-01-25 NOTE — Assessment & Plan Note (Signed)
Under good control on current regimen. Continue current regimen. Continue to monitor. Call with any concerns. Refills given. Labs drawn today.   

## 2021-01-25 NOTE — Assessment & Plan Note (Signed)
Concern for symptomatic bradycardia- will get him into cardiology. Await their input. Not orthostatic. Checking labs.

## 2021-01-25 NOTE — Patient Instructions (Signed)
Health Maintenance After Age 78 After age 78, you are at a higher risk for certain long-term diseases and infections as well as injuries from falls. Falls are a major cause of broken bones and head injuries in people who are older than age 78. Getting regular preventive care can help to keep you healthy and well. Preventive care includes getting regular testing and making lifestyle changes as recommended by your health care provider. Talk with your health care provider about:  Which screenings and tests you should have. A screening is a test that checks for a disease when you have no symptoms.  A diet and exercise plan that is right for you. What should I know about screenings and tests to prevent falls? Screening and testing are the best ways to find a health problem early. Early diagnosis and treatment give you the best chance of managing medical conditions that are common after age 78. Certain conditions and lifestyle choices may make you more likely to have a fall. Your health care provider may recommend:  Regular vision checks. Poor vision and conditions such as cataracts can make you more likely to have a fall. If you wear glasses, make sure to get your prescription updated if your vision changes.  Medicine review. Work with your health care provider to regularly review all of the medicines you are taking, including over-the-counter medicines. Ask your health care provider about any side effects that may make you more likely to have a fall. Tell your health care provider if any medicines that you take make you feel dizzy or sleepy.  Osteoporosis screening. Osteoporosis is a condition that causes the bones to get weaker. This can make the bones weak and cause them to break more easily.  Blood pressure screening. Blood pressure changes and medicines to control blood pressure can make you feel dizzy.  Strength and balance checks. Your health care provider may recommend certain tests to check your  strength and balance while standing, walking, or changing positions.  Foot health exam. Foot pain and numbness, as well as not wearing proper footwear, can make you more likely to have a fall.  Depression screening. You may be more likely to have a fall if you have a fear of falling, feel emotionally low, or feel unable to do activities that you used to do.  Alcohol use screening. Using too much alcohol can affect your balance and may make you more likely to have a fall. What actions can I take to lower my risk of falls? General instructions  Talk with your health care provider about your risks for falling. Tell your health care provider if: ? You fall. Be sure to tell your health care provider about all falls, even ones that seem minor. ? You feel dizzy, sleepy, or off-balance.  Take over-the-counter and prescription medicines only as told by your health care provider. These include any supplements.  Eat a healthy diet and maintain a healthy weight. A healthy diet includes low-fat dairy products, low-fat (lean) meats, and fiber from whole grains, beans, and lots of fruits and vegetables. Home safety  Remove any tripping hazards, such as rugs, cords, and clutter.  Install safety equipment such as grab bars in bathrooms and safety rails on stairs.  Keep rooms and walkways well-lit. Activity  Follow a regular exercise program to stay fit. This will help you maintain your balance. Ask your health care provider what types of exercise are appropriate for you.  If you need a cane or walker,   use it as recommended by your health care provider.  Wear supportive shoes that have nonskid soles.   Lifestyle  Do not drink alcohol if your health care provider tells you not to drink.  If you drink alcohol, limit how much you have: ? 0-1 drink a day for women. ? 0-2 drinks a day for men.  Be aware of how much alcohol is in your drink. In the [place] one drink equals one typical bottle of beer (12  oz), one-half glass of wine (5 oz), or one shot of hard liquor (1 oz).  Do not use any products that contain nicotine or tobacco, such as cigarettes and e-cigarettes. If you need help quitting, ask your health care provider. Summary  Having a healthy lifestyle and getting preventive care can help to protect your health and wellness after age 78.  Screening and testing are the best way to find a health problem early and help you avoid having a fall. Early diagnosis and treatment give you the best chance for managing medical conditions that are more common for people who are older than age 78.  Falls are a major cause of broken bones and head injuries in people who are older than age 78. Take precautions to prevent a fall at home.  Work with your health care provider to learn what changes you can make to improve your health and wellness and to prevent falls. This information is not intended to replace advice given to you by your health care provider. Make sure you discuss any questions you have with your health care provider. Document Revised: 01/09/2019 Document Reviewed: 08/01/2017 Elsevier Patient Education  2021 [employer] Inc.  

## 2021-01-25 NOTE — Progress Notes (Signed)
BP (!) 147/78   Pulse (!) 59   Temp 98.2 F (36.8 C)   Ht 5\' 8"  (1.727 m)   Wt 182 lb 12.8 oz (82.9 kg)   SpO2 96%   BMI 27.79 kg/m    Subjective:    Patient ID: Zachary Hansen, male    DOB: 1941/03/17, 80 y.o.   MRN: [id number]  HPI: Zachary Hansen is a 80 y.o. male presenting on 01/25/2021 for comprehensive medical examination. Current medical complaints include:  DIZZINESS Duration: couple of Description of symptoms: lightheaded Duration of episode: few hours Dizziness frequency: no history of the same Provoking factors: bending over to standing  Triggered by rolling over in bed: no Triggered by bending over: yes Aggravated by head movement: no Aggravated by exertion, coughing, loud noises: no Recent head injury: no Recent or current viral symptoms: no History of vasovagal episodes: no- but near syncope after coughing fit Nausea: no Vomiting: no Tinnitus: yes Hearing loss: yes Aural fullness: no Headache: no Photophobia/phonophobia: no Unsteady gait: yes Postural instability: yes Diplopia, dysarthria, dysphagia or weakness: no Related to exertion: yes Pallor: no Diaphoresis: no Dyspnea: yes Chest pain: no  HYPERTENSION / HYPERLIPIDEMIA Satisfied with current treatment? yes Duration of hypertension: chronic BP monitoring frequency: not checking BP medication side effects: no Past BP meds: HCTZ, amlodipine, valsartan Duration of hyperlipidemia: chronic Cholesterol medication side effects: not on anything Past cholesterol medications: none Medication compliance: excellent compliance Aspirin: no Recent stressors: no Recurrent headaches: no Visual changes: no Palpitations: no Dyspnea: yes Chest pain: no Lower extremity edema: no Dizzy/lightheaded: yes  DEPRESSION Mood status: controlled Satisfied with current treatment?: no Symptom severity: mild  Duration of current treatment : chronic Side effects: no Medication compliance: excellent  compliance Psychotherapy/counseling: no  Previous psychiatric medications: fluoxetine Depressed mood: no Anxious mood: no Anhedonia: no Significant weight loss or gain: no Insomnia: no  Fatigue: yes Feelings of worthlessness or guilt: no Impaired concentration/indecisiveness: no Suicidal ideations: no Hopelessness: no Crying spells: no Depression screen [facility] 2/9 01/25/2021 07/12/2020 01/09/2020 01/09/2020 02/11/2019  Decreased Interest 0 2 1 1  0  Down, Depressed, Hopeless 1 2 0 0 0  PHQ - 2 Score 1 4 1 1  0  Altered sleeping 3 2 2 2 1   Tired, decreased energy 3 1 2 2 1   Change in appetite 0 0 1 1 0  Feeling bad or failure about yourself  0 1 0 0 0  Trouble concentrating 0 0 0 0 0  Moving slowly or fidgety/restless 0 1 0 0 0  Suicidal thoughts 0 0 0 0 0  PHQ-9 Score 7 9 6 6 2   Difficult doing work/chores Not difficult at all Somewhat difficult Not difficult at all Not difficult at all Not difficult at all  Some recent data might be hidden   Interim Problems from his last visit: no  Depression Screen done today and results listed below:  Depression screen [facility] 2/9 01/25/2021 07/12/2020 01/09/2020 01/09/2020 02/11/2019  Decreased Interest 0 2 1 1  0  Down, Depressed, Hopeless 1 2 0 0 0  PHQ - 2 Score 1 4 1 1  0  Altered sleeping 3 2 2 2 1   Tired, decreased energy 3 1 2 2 1   Change in appetite 0 0 1 1 0  Feeling bad or failure about yourself  0 1 0 0 0  Trouble concentrating 0 0 0 0 0  Moving slowly or fidgety/restless 0 1 0 0 0  Suicidal thoughts 0 0 0 0  0  PHQ-9 Score 7 9 6 6 2   Difficult doing work/chores Not difficult at all Somewhat difficult Not difficult at all Not difficult at all Not difficult at all  Some recent data might be hidden    Past Medical History:  Past Medical History:  Diagnosis Date  . ADHD (attention deficit hyperactivity disorder)   . Depression   . Prostate cancer ([place])   . Sleep apnea     Surgical History:  Past Surgical History:  Procedure Laterality  Date  . NASAL SEPTUM SURGERY    . prostate cancer surgery    . THROAT SURGERY     Due to snoring    Medications:  Current Outpatient Medications on File Prior to Visit  Medication Sig  . Cyanocobalamin (VITAMIN B 12 PO) Take 1,000 mcg by mouth daily.  . cyclobenzaprine (FLEXERIL) 10 MG tablet TAKE 1 TABLET BY MOUTH EVERYDAY AT BEDTIME  . Misc Natural Products (OSTEO BI-FLEX JOINT SHIELD PO) Take by mouth.  . Multiple Vitamin (MULTI-VITAMINS) TABS Take by mouth.  . naproxen (NAPROSYN) 500 MG tablet TAKE 1 TABLET BY MOUTH TWICE A DAY AS NEEDED  . VITAMIN E PO Take by mouth daily.   No current facility-administered medications on file prior to visit.    Allergies:  Allergies  Allergen Reactions  . Lisinopril Cough    Social History:  Social History   Socioeconomic History  . Marital status: Married    Spouse name: Not on file  . Number of children: Not on file  . Years of education: Not on file  . Highest education level: Not on file  Occupational History  . Not on file  Tobacco Use  . Smoking status: Former Smoker    Quit date: 10/02/1968    Years since quitting: 52.3  . Smokeless tobacco: Never Used  Vaping Use  . Vaping Use: Never used  Substance and Sexual Activity  . Alcohol use: Yes    Comment: 1 drink a day   . Drug use: No  . Sexual activity: Never  Other Topics Concern  . Not on file  Social History Narrative  . Not on file   Social Determinants of Health   Financial Resource Strain: Not on file  Food Insecurity: Not on file  Transportation Needs: Not on file  Physical Activity: Not on file  Stress: Not on file  Social Connections: Not on file  Intimate Partner Violence: Not on file   Social History   Tobacco Use  Smoking Status Former Smoker  . Quit date: 10/02/1968  . Years since quitting: 52.3  Smokeless Tobacco Never Used   Social History   Substance and Sexual Activity  Alcohol Use Yes   Comment: 1 drink a day     Family History:   Family History  Problem Relation Age of Onset  . Arthritis Mother   . Heart disease Father   . Cancer Father   . Heart disease Sister   . Cancer Sister   . Sleep apnea Brother   . Alzheimer's disease Maternal Grandfather   . Heart disease Paternal Grandmother   . Diverticulitis Sister   . Sleep apnea Sister     Past medical history, surgical history, medications, allergies, family history and social history reviewed with patient today and changes made to appropriate areas of the chart.   Review of Systems  Constitutional: Negative.   HENT: Positive for hearing loss and tinnitus. Negative for congestion, ear discharge, ear pain, nosebleeds, sinus pain and  sore throat.   Eyes: Positive for blurred vision. Negative for double vision, photophobia, pain, discharge and redness.  Respiratory: Positive for cough and shortness of breath. Negative for hemoptysis, sputum production, wheezing and stridor.   Cardiovascular: Negative.   Gastrointestinal: Positive for heartburn. Negative for abdominal pain, blood in stool, constipation, diarrhea, melena, nausea and vomiting.  Genitourinary: Negative.   Musculoskeletal: Negative.   Skin: Negative.   Neurological: Positive for dizziness and headaches. Negative for tingling, tremors, sensory change, speech change, focal weakness, seizures, loss of consciousness and weakness.  Endo/Heme/Allergies: Positive for environmental allergies. Negative for polydipsia. Does not bruise/bleed easily.  Psychiatric/Behavioral: Negative.    All other ROS negative except what is listed above and in the HPI.      Objective:    BP (!) 147/78   Pulse (!) 59   Temp 98.2 F (36.8 C)   Ht 5\' 8"  (1.727 m)   Wt 182 lb 12.8 oz (82.9 kg)   SpO2 96%   BMI 27.79 kg/m   Wt Readings from Last 3 Encounters:  01/25/21 182 lb 12.8 oz (82.9 kg)  07/12/20 174 lb (78.9 kg)  03/15/20 176 lb (79.8 kg)   No data found.   Physical Exam Vitals and nursing note reviewed.   Constitutional:      General: He is not in acute distress.    Appearance: Normal appearance. He is obese. He is not ill-appearing, toxic-appearing or diaphoretic.  HENT:     Head: Normocephalic and atraumatic.     Right Ear: Tympanic membrane, ear canal and external ear normal. There is no impacted cerumen.     Left Ear: Tympanic membrane, ear canal and external ear normal. There is no impacted cerumen.     Nose: Nose normal. No congestion or rhinorrhea.     Mouth/Throat:     Mouth: Mucous membranes are moist.     Pharynx: Oropharynx is clear. No oropharyngeal exudate or posterior oropharyngeal erythema.  Eyes:     General: No scleral icterus.       Right eye: No discharge.        Left eye: No discharge.     Extraocular Movements: Extraocular movements intact.     Conjunctiva/sclera: Conjunctivae normal.     Pupils: Pupils are equal, round, and reactive to light.  Neck:     Vascular: No carotid bruit.  Cardiovascular:     Rate and Rhythm: Regular rhythm. Bradycardia present.     Pulses: Normal pulses.     Heart sounds: No murmur heard. No friction rub. No gallop.   Pulmonary:     Effort: Pulmonary effort is normal. No respiratory distress.     Breath sounds: Normal breath sounds. No stridor. No wheezing, rhonchi or rales.  Chest:     Chest wall: No tenderness.  Abdominal:     General: Abdomen is flat. Bowel sounds are normal. There is no distension.     Palpations: Abdomen is soft. There is no mass.     Tenderness: There is no abdominal tenderness. There is no right CVA tenderness, left CVA tenderness, guarding or rebound.     Hernia: No hernia is present.  Genitourinary:    Comments: Genital exam deferred with shared decision making Musculoskeletal:        General: No swelling, tenderness, deformity or signs of injury.     Cervical back: Normal range of motion and neck supple. No rigidity. No muscular tenderness.     Right lower leg: No edema.  Left lower leg: No edema.   Lymphadenopathy:     Cervical: No cervical adenopathy.  Skin:    General: Skin is warm and dry.     Capillary Refill: Capillary refill takes less than 2 seconds.     Coloration: Skin is not jaundiced or pale.     Findings: No bruising, erythema, lesion or rash.  Neurological:     General: No focal deficit present.     Mental Status: He is alert and oriented to person, place, and time.     Cranial Nerves: No cranial nerve deficit.     Sensory: No sensory deficit.     Motor: No weakness.     Coordination: Coordination normal.     Gait: Gait normal.     Deep Tendon Reflexes: Reflexes normal.  Psychiatric:        Mood and Affect: Mood normal.        Behavior: Behavior normal.        Thought Content: Thought content normal.        Judgment: Judgment normal.     Results for orders placed or performed in visit on 07/12/20  Comprehensive metabolic panel  Result Value Ref Range   Glucose 128 (H) 65 - 99 mg/dL   BUN 20 8 - 27 mg/dL   Creatinine, Ser 1.18 0.76 - 1.27 mg/dL   GFR calc non Af Amer 60 >59 mL/min/1.73   GFR calc Af Amer 69 >59 mL/min/1.73   BUN/Creatinine Ratio 17 10 - 24   Sodium 136 134 - 144 mmol/L   Potassium 3.7 3.5 - 5.2 mmol/L   Chloride 97 96 - 106 mmol/L   CO2 25 20 - 29 mmol/L   Calcium 9.8 8.6 - 10.2 mg/dL   Total Protein 7.4 6.0 - 8.5 g/dL   Albumin 4.7 3.7 - 4.7 g/dL   Globulin, Total 2.7 1.5 - 4.5 g/dL   Albumin/Globulin Ratio 1.7 1.2 - 2.2   Bilirubin Total 0.5 0.0 - 1.2 mg/dL   Alkaline Phosphatase 98 44 - 121 IU/L   AST 16 0 - 40 IU/L   ALT 14 0 - 44 IU/L  Lipid Panel w/o Chol/HDL Ratio  Result Value Ref Range   Cholesterol, Total 222 (H) 100 - 199 mg/dL   Triglycerides 108 0 - 149 mg/dL   HDL 55 >39 mg/dL   VLDL Cholesterol Cal 19 5 - 40 mg/dL   LDL Chol Calc (NIH) 148 (H) 0 - 99 mg/dL  Hepatitis C Antibody  Result Value Ref Range   Hep C Virus Ab <0.1 0.0 - 0.9 s/co ratio  PSA  Result Value Ref Range   Prostate Specific Ag, Serum 0.5  0.0 - 4.0 ng/mL      Assessment & Plan:   Problem List Items Addressed This Visit      Cardiovascular and Mediastinum   First degree AV block    Found on EKG today- no previous available for comparison. Patient has been having dizzy spells and near syncope. Will get him into cardiology. Referral generated today. Call with any concerns.       Relevant Medications   amLODipine (NORVASC) 5 MG tablet   hydrochlorothiazide (HYDRODIURIL) 25 MG tablet   valsartan (DIOVAN) 40 MG tablet   Other Relevant Orders   Ambulatory referral to Cardiology     Genitourinary   Benign hypertensive renal disease    Under good control on current regimen. Continue current regimen. Continue to monitor. Call with any concerns. Refills given. Labs  drawn today.        Relevant Orders   Comprehensive metabolic panel   CBC with Differential/Platelet   TSH   Urinalysis, Routine w reflex microscopic   Microalbumin, Urine Waived   Malignant neoplasm of prostate ([place])    Rechecking labs today. Await results. Treat as needed.       Relevant Orders   Comprehensive metabolic panel   CBC with Differential/Platelet   PSA   Urinalysis, Routine w reflex microscopic     Other   Bradycardia    Not on any medications that should be dropping him. Has seen cardiology previously, but now with AV block and dizziness- will get back into see cardiology. Call with any concerns.       Relevant Orders   Ambulatory referral to Cardiology   Depression, major, single episode, in partial remission ([place])    Under good control on current regimen. Continue current regimen. Continue to monitor. Call with any concerns. Refills given. Labs drawn today.       Relevant Medications   FLUoxetine (PROZAC) 40 MG capsule   Other Relevant Orders   Comprehensive metabolic panel   CBC with Differential/Platelet   Hyperlipidemia    Under good control on current regimen. Continue current regimen. Continue to monitor. Call with any  concerns. Refills given. Labs drawn today.       Relevant Medications   amLODipine (NORVASC) 5 MG tablet   hydrochlorothiazide (HYDRODIURIL) 25 MG tablet   valsartan (DIOVAN) 40 MG tablet   Other Relevant Orders   Comprehensive metabolic panel   CBC with Differential/Platelet   Lipid Panel w/o Chol/HDL Ratio   Dizziness    Concern for symptomatic bradycardia- will get him into cardiology. Await their input. Not orthostatic. Checking labs.       Relevant Orders   [facility] Hb A1c Waived   Ambulatory referral to Cardiology    Other Visit Diagnoses    Routine general medical examination at a health care facility    -  Primary   Vaccines up to date. Screening labs checked today. Continue diet and exercise. Call with any concerns. Continue to monitor.    Dizzy spells       Relevant Orders   EKG 12-Lead (Completed)   Ambulatory referral to Cardiology       LABORATORY TESTING:  Health maintenance labs ordered today as discussed above.   The natural history of prostate cancer and ongoing controversy regarding screening and potential treatment outcomes of prostate cancer has been discussed with the patient. The meaning of a false positive PSA and a false negative PSA has been discussed. He indicates understanding of the limitations of this screening test and wishes to proceed with screening PSA testing.   IMMUNIZATIONS:   - Tdap: Tetanus vaccination status reviewed: last tetanus booster within 10 years. - Influenza: Up to date - Pneumovax: Up to date - Prevnar: Up to date - COVID: Up to date  SCREENING: - Colonoscopy: Not applicable  Discussed with patient purpose of the colonoscopy is to detect colon cancer at curable precancerous or early stages   PATIENT COUNSELING:    Sexuality: Discussed sexually transmitted diseases, partner selection, use of condoms, avoidance of unintended pregnancy  and contraceptive alternatives.   Advised to avoid cigarette smoking.  I discussed  with the patient that most people either abstain from alcohol or drink within safe limits (<=14/week and <=4 drinks/occasion for males, <=7/weeks and <= 3 drinks/occasion for females) and that the risk for alcohol  disorders and other health effects rises proportionally with the number of drinks per week and how often a drinker exceeds daily limits.  Discussed cessation/primary prevention of drug use and availability of treatment for abuse.   Diet: Encouraged to adjust caloric intake to maintain  or achieve ideal body weight, to reduce intake of dietary saturated fat and total fat, to limit sodium intake by avoiding high sodium foods and not adding table salt, and to maintain adequate dietary potassium and calcium preferably from fresh fruits, vegetables, and low-fat dairy products.    stressed the importance of regular exercise  Injury prevention: Discussed safety belts, safety helmets, smoke detector, smoking near bedding or upholstery.   Dental health: Discussed importance of regular tooth brushing, flossing, and dental visits.   Follow up plan: NEXT PREVENTATIVE PHYSICAL DUE IN 1 YEAR. Return in about 6 months (around 07/27/2021).

## 2021-01-26 LAB — CBC WITH DIFFERENTIAL/PLATELET
Basophils Absolute: 0.1 10*3/uL (ref 0.0–0.2)
Basos: 1 %
EOS (ABSOLUTE): 0.1 10*3/uL (ref 0.0–0.4)
Eos: 1 %
Hematocrit: 42.1 % (ref 37.5–51.0)
Hemoglobin: 14.6 g/dL (ref 13.0–17.7)
Immature Grans (Abs): 0 10*3/uL (ref 0.0–0.1)
Immature Granulocytes: 0 %
Lymphocytes Absolute: 2.2 10*3/uL (ref 0.7–3.1)
Lymphs: 35 %
MCH: 32.6 pg (ref 26.6–33.0)
MCHC: 34.7 g/dL (ref 31.5–35.7)
MCV: 94 fL (ref 79–97)
Monocytes Absolute: 0.6 10*3/uL (ref 0.1–0.9)
Monocytes: 10 %
Neutrophils Absolute: 3.3 10*3/uL (ref 1.4–7.0)
Neutrophils: 53 %
Platelets: 222 10*3/uL (ref 150–450)
RBC: 4.48 x10E6/uL (ref 4.14–5.80)
RDW: 13.1 % (ref 11.6–15.4)
WBC: 6.2 10*3/uL (ref 3.4–10.8)

## 2021-01-26 LAB — LIPID PANEL W/O CHOL/HDL RATIO
Cholesterol, Total: 228 mg/dL — ABNORMAL HIGH (ref 100–199)
HDL: 58 mg/dL (ref >39)
LDL Chol Calc (NIH): 148 mg/dL — ABNORMAL HIGH (ref 0–99)
Triglycerides: 121 mg/dL (ref 0–149)
VLDL Cholesterol Cal: 22 mg/dL (ref 5–40)

## 2021-01-26 LAB — TSH: TSH: 1.42 u[IU]/mL (ref 0.450–4.500)

## 2021-01-26 LAB — COMPREHENSIVE METABOLIC PANEL
ALT: 17 IU/L (ref 0–44)
AST: 20 IU/L (ref 0–40)
Albumin/Globulin Ratio: 1.8 (ref 1.2–2.2)
Albumin: 4.9 g/dL — ABNORMAL HIGH (ref 3.7–4.7)
Alkaline Phosphatase: 96 IU/L (ref 44–121)
BUN/Creatinine Ratio: 18 (ref 10–24)
BUN: 21 mg/dL (ref 8–27)
Bilirubin Total: 0.5 mg/dL (ref 0.0–1.2)
CO2: 25 mmol/L (ref 20–29)
Calcium: 10.4 mg/dL — ABNORMAL HIGH (ref 8.6–10.2)
Chloride: 98 mmol/L (ref 96–106)
Creatinine, Ser: 1.14 mg/dL (ref 0.76–1.27)
Globulin, Total: 2.8 g/dL (ref 1.5–4.5)
Glucose: 95 mg/dL (ref 65–99)
Potassium: 3.9 mmol/L (ref 3.5–5.2)
Sodium: 140 mmol/L (ref 134–144)
Total Protein: 7.7 g/dL (ref 6.0–8.5)
eGFR: 66 mL/min/{1.73_m2} (ref >59)

## 2021-01-26 LAB — PSA: Prostate Specific Ag, Serum: 0.6 ng/mL (ref 0.0–4.0)

## 2021-01-28 ENCOUNTER — Encounter: Payer: Self-pay | Admitting: Family Medicine

## 2021-03-28 ENCOUNTER — Telehealth: Payer: Self-pay

## 2021-03-28 ENCOUNTER — Ambulatory Visit: Payer: Medicare Other

## 2021-03-28 NOTE — Telephone Encounter (Signed)
This nurse called patient in regards to our scheduled telephonic AWV. He stated that he would like to do it another day. Was driving and unable to reschedule at this time.

## 2021-06-16 DIAGNOSIS — H43813 Vitreous degeneration, bilateral: Secondary | ICD-10-CM | POA: Diagnosis not present

## 2021-06-16 DIAGNOSIS — H52223 Regular astigmatism, bilateral: Secondary | ICD-10-CM | POA: Diagnosis not present

## 2021-06-16 DIAGNOSIS — H2513 Age-related nuclear cataract, bilateral: Secondary | ICD-10-CM | POA: Diagnosis not present

## 2021-06-16 DIAGNOSIS — H5203 Hypermetropia, bilateral: Secondary | ICD-10-CM | POA: Diagnosis not present

## 2021-06-16 DIAGNOSIS — H524 Presbyopia: Secondary | ICD-10-CM | POA: Diagnosis not present

## 2021-07-05 ENCOUNTER — Other Ambulatory Visit: Payer: Self-pay

## 2021-07-05 ENCOUNTER — Encounter: Payer: Self-pay | Admitting: Family Medicine

## 2021-07-05 ENCOUNTER — Ambulatory Visit (INDEPENDENT_AMBULATORY_CARE_PROVIDER_SITE_OTHER): Payer: Medicare Other | Admitting: Family Medicine

## 2021-07-05 VITALS — BP 136/83 | HR 77 | Ht 68.0 in | Wt 182.0 lb

## 2021-07-05 DIAGNOSIS — C61 Malignant neoplasm of prostate: Secondary | ICD-10-CM

## 2021-07-05 DIAGNOSIS — Z23 Encounter for immunization: Secondary | ICD-10-CM

## 2021-07-05 DIAGNOSIS — E782 Mixed hyperlipidemia: Secondary | ICD-10-CM | POA: Diagnosis not present

## 2021-07-05 DIAGNOSIS — R5382 Chronic fatigue, unspecified: Secondary | ICD-10-CM | POA: Diagnosis not present

## 2021-07-05 DIAGNOSIS — E291 Testicular hypofunction: Secondary | ICD-10-CM | POA: Diagnosis not present

## 2021-07-05 DIAGNOSIS — F324 Major depressive disorder, single episode, in partial remission: Secondary | ICD-10-CM

## 2021-07-05 DIAGNOSIS — I129 Hypertensive chronic kidney disease with stage 1 through stage 4 chronic kidney disease, or unspecified chronic kidney disease: Secondary | ICD-10-CM

## 2021-07-05 MED ORDER — VALSARTAN 40 MG PO TABS: 80.0000 mg | ORAL_TABLET | Freq: Every day | ORAL | 1 refills | Status: DC

## 2021-07-05 MED ORDER — NAPROXEN 500 MG PO TABS: 500.0000 mg | ORAL_TABLET | Freq: Two times a day (BID) | ORAL | 1 refills | Status: DC | PRN

## 2021-07-05 MED ORDER — HYDROCHLOROTHIAZIDE 25 MG PO TABS: 25.0000 mg | ORAL_TABLET | Freq: Every day | ORAL | 1 refills | Status: DC

## 2021-07-05 MED ORDER — AMLODIPINE BESYLATE 5 MG PO TABS: 5.0000 mg | ORAL_TABLET | Freq: Every day | ORAL | 1 refills | Status: DC

## 2021-07-05 MED ORDER — FLUOXETINE HCL 40 MG PO CAPS: ORAL_CAPSULE | ORAL | 1 refills | Status: DC

## 2021-07-05 MED ORDER — CYCLOBENZAPRINE HCL 10 MG PO TABS: ORAL_TABLET | ORAL | 1 refills | Status: DC

## 2021-07-05 NOTE — Assessment & Plan Note (Signed)
Rechecking PSA today. Await results. Treat as needed.  

## 2021-07-05 NOTE — Assessment & Plan Note (Signed)
Rechecking labs today. Await results. Treat as needed.  °

## 2021-07-05 NOTE — Assessment & Plan Note (Signed)
Under good control on current regimen. Continue current regimen. Continue to monitor. Call with any concerns. Refills given. Labs drawn today.   

## 2021-07-05 NOTE — Assessment & Plan Note (Signed)
Rechecking labs. Not able to treat due to history of prostate cancer.

## 2021-07-05 NOTE — Progress Notes (Signed)
BP 136/83   Pulse 77   Ht $R'5\' 8"'zD$  (1.727 m)   Wt 182 lb (82.6 kg)   BMI 27.67 kg/m    Subjective:    Patient ID: Zachary Hansen, male    DOB: May 26, 1941, 80 y.o.   MRN: [id number]  HPI: Zachary Hansen is a 80 y.o. male  Chief Complaint  Patient presents with   Hyperlipidemia   Fatigue   FATIGUE Duration:  months Severity: severe  Onset: gradual Context when symptoms started:  unknown Symptoms improve with rest: yes  Depressive symptoms: no Stress/anxiety: yes Insomnia: no  Snoring: yes Observed apnea by bed partner: yes- uses a CPAP Daytime hypersomnolence:no Wakes feeling refreshed: yes History of sleep study: yes Dysnea on exertion:  no Orthopnea/PND: no Chest pain: no Chronic cough: no Lower extremity edema: no Arthralgias:no Myalgias: no Weakness: no Rash: no  HYPERTENSION / HYPERLIPIDEMIA Satisfied with current treatment? yes Duration of hypertension: chronic BP monitoring frequency: not checking BP medication side effects: no Past BP meds: valsartan, HCTZ, amlodipine Duration of hyperlipidemia: chronic Cholesterol medication side effects: no Cholesterol supplements: none Past cholesterol medications: none Medication compliance: excellent compliance Aspirin: no Recent stressors: no Recurrent headaches: no Visual changes: no Palpitations: no Dyspnea: no Chest pain: no Lower extremity edema: no Dizzy/lightheaded: no  DEPRESSION Mood status: controlled Satisfied with current treatment?: yes Symptom severity: mild  Duration of current treatment : chronic Side effects: no Medication compliance: excellent compliance Psychotherapy/counseling: no  Previous psychiatric medications: prozac Depressed mood: no Anxious mood: no Anhedonia: no Significant weight loss or gain: no Insomnia: no  Fatigue: yes Feelings of worthlessness or guilt: no Impaired concentration/indecisiveness: no Suicidal ideations: no Hopelessness: no Crying spells:  no Depression screen [facility] 2/9 07/05/2021 01/25/2021 07/12/2020 01/09/2020 01/09/2020  Decreased Interest 0 0 $R'2 1 1  'yC$ Down, Depressed, Hopeless 0 1 2 0 0  PHQ - 2 Score 0 $Remov'1 4 1 1  'UiNYrI$ Altered sleeping $RemoveBeforeDE'3 3 2 2 2  'KgGshoesjxHDCUn$ Tired, decreased energy $RemoveBeforeDE'3 3 1 2 2  'SCMSXcnPbHMmvjE$ Change in appetite 0 0 0 1 1  Feeling bad or failure about yourself  0 0 1 0 0  Trouble concentrating 1 0 0 0 0  Moving slowly or fidgety/restless 0 0 1 0 0  Suicidal thoughts 0 0 0 0 0  PHQ-9 Score $RemoveBef'7 7 9 6 6  'ErITLxttQD$ Difficult doing work/chores Not difficult at all Not difficult at all Somewhat difficult Not difficult at all Not difficult at all  Some recent data might be hidden    Relevant past medical, surgical, family and social history reviewed and updated as indicated. Interim medical history since our last visit reviewed. Allergies and medications reviewed and updated.  Review of Systems  Constitutional: Negative.   Respiratory: Negative.    Cardiovascular: Negative.   Gastrointestinal: Negative.   Musculoskeletal: Negative.   Neurological: Negative.   Psychiatric/Behavioral: Negative.     Per HPI unless specifically indicated above     Objective:    BP 136/83   Pulse 77   Ht $R'5\' 8"'sQ$  (1.727 m)   Wt 182 lb (82.6 kg)   BMI 27.67 kg/m   Wt Readings from Last 3 Encounters:  07/05/21 182 lb (82.6 kg)  01/25/21 182 lb 12.8 oz (82.9 kg)  07/12/20 174 lb (78.9 kg)    Physical Exam Vitals and nursing note reviewed.  Constitutional:      General: He is not in acute distress.    Appearance: Normal appearance. He is not ill-appearing, toxic-appearing  or diaphoretic.  HENT:     Head: Normocephalic and atraumatic.     Right Ear: External ear normal.     Left Ear: External ear normal.     Nose: Nose normal.     Mouth/Throat:     Mouth: Mucous membranes are moist.     Pharynx: Oropharynx is clear.  Eyes:     General: No scleral icterus.       Right eye: No discharge.        Left eye: No discharge.     Extraocular Movements: Extraocular  movements intact.     Conjunctiva/sclera: Conjunctivae normal.     Pupils: Pupils are equal, round, and reactive to light.  Cardiovascular:     Rate and Rhythm: Normal rate and regular rhythm.     Pulses: Normal pulses.     Heart sounds: Normal heart sounds. No murmur heard.   No friction rub. No gallop.  Pulmonary:     Effort: Pulmonary effort is normal. No respiratory distress.     Breath sounds: Normal breath sounds. No stridor. No wheezing, rhonchi or rales.  Chest:     Chest wall: No tenderness.  Musculoskeletal:        General: Normal range of motion.     Cervical back: Normal range of motion and neck supple.  Skin:    General: Skin is warm and dry.     Capillary Refill: Capillary refill takes less than 2 seconds.     Coloration: Skin is not jaundiced or pale.     Findings: No bruising, erythema, lesion or rash.  Neurological:     General: No focal deficit present.     Mental Status: He is alert and oriented to person, place, and time. Mental status is at baseline.  Psychiatric:        Mood and Affect: Mood normal.        Behavior: Behavior normal.        Thought Content: Thought content normal.        Judgment: Judgment normal.    Results for orders placed or performed in visit on 01/25/21  Comprehensive metabolic panel  Result Value Ref Range   Glucose 95 65 - 99 mg/dL   BUN 21 8 - 27 mg/dL   Creatinine, Ser 1.14 0.76 - 1.27 mg/dL   eGFR 66 >59 mL/min/1.73   BUN/Creatinine Ratio 18 10 - 24   Sodium 140 134 - 144 mmol/L   Potassium 3.9 3.5 - 5.2 mmol/L   Chloride 98 96 - 106 mmol/L   CO2 25 20 - 29 mmol/L   Calcium 10.4 (H) 8.6 - 10.2 mg/dL   Total Protein 7.7 6.0 - 8.5 g/dL   Albumin 4.9 (H) 3.7 - 4.7 g/dL   Globulin, Total 2.8 1.5 - 4.5 g/dL   Albumin/Globulin Ratio 1.8 1.2 - 2.2   Bilirubin Total 0.5 0.0 - 1.2 mg/dL   Alkaline Phosphatase 96 44 - 121 IU/L   AST 20 0 - 40 IU/L   ALT 17 0 - 44 IU/L  CBC with Differential/Platelet  Result Value Ref Range    WBC 6.2 3.4 - 10.8 x10E3/uL   RBC 4.48 4.14 - 5.80 x10E6/uL   Hemoglobin 14.6 13.0 - 17.7 g/dL   Hematocrit 42.1 37.5 - 51.0 %   MCV 94 79 - 97 fL   MCH 32.6 26.6 - 33.0 pg   MCHC 34.7 31.5 - 35.7 g/dL   RDW 13.1 11.6 - 15.4 %   Platelets 222  150 - 450 x10E3/uL   Neutrophils 53 Not Estab. %   Lymphs 35 Not Estab. %   Monocytes 10 Not Estab. %   Eos 1 Not Estab. %   Basos 1 Not Estab. %   Neutrophils Absolute 3.3 1.4 - 7.0 x10E3/uL   Lymphocytes Absolute 2.2 0.7 - 3.1 x10E3/uL   Monocytes Absolute 0.6 0.1 - 0.9 x10E3/uL   EOS (ABSOLUTE) 0.1 0.0 - 0.4 x10E3/uL   Basophils Absolute 0.1 0.0 - 0.2 x10E3/uL   Immature Granulocytes 0 Not Estab. %   Immature Grans (Abs) 0.0 0.0 - 0.1 x10E3/uL  Lipid Panel w/o Chol/HDL Ratio  Result Value Ref Range   Cholesterol, Total 228 (H) 100 - 199 mg/dL   Triglycerides 121 0 - 149 mg/dL   HDL 58 >39 mg/dL   VLDL Cholesterol Cal 22 5 - 40 mg/dL   LDL Chol Calc (NIH) 148 (H) 0 - 99 mg/dL  PSA  Result Value Ref Range   Prostate Specific Ag, Serum 0.6 0.0 - 4.0 ng/mL  TSH  Result Value Ref Range   TSH 1.420 0.450 - 4.500 uIU/mL  Urinalysis, Routine w reflex microscopic  Result Value Ref Range   Specific Gravity, UA 1.025 1.005 - 1.030   pH, UA 6.0 5.0 - 7.5   Color, UA Yellow Yellow   Appearance Ur Clear Clear   Leukocytes,UA Negative Negative   Protein,UA Negative Negative/Trace   Glucose, UA Negative Negative   Ketones, UA Negative Negative   RBC, UA Negative Negative   Bilirubin, UA Negative Negative   Urobilinogen, Ur 0.2 0.2 - 1.0 mg/dL   Nitrite, UA Negative Negative  Microalbumin, Urine Waived  Result Value Ref Range   Microalb, Ur Waived 10 0 - 19 mg/L   Creatinine, Urine Waived 300 10 - 300 mg/dL   Microalb/Creat Ratio <30 <30 mg/g  Bayer DCA Hb A1c Waived  Result Value Ref Range   HB A1C (BAYER DCA - WAIVED) 5.9 <7.0 %      Assessment & Plan:   Problem List Items Addressed This Visit       Endocrine   Hypogonadism in  male    Rechecking labs. Not able to treat due to history of prostate cancer.       Relevant Orders   CBC with Differential/Platelet   Comprehensive metabolic panel   Testosterone, free, total([facility])     Genitourinary   Benign hypertensive renal disease    Under good control on current regimen. Continue current regimen. Continue to monitor. Call with any concerns. Refills given. Labs drawn today.       Relevant Orders   CBC with Differential/Platelet   Comprehensive metabolic panel   Malignant neoplasm of prostate ([place])    Rechecking PSA today. Await results. Treat as needed.       Relevant Orders   CBC with Differential/Platelet   Comprehensive metabolic panel   PSA     Other   Depression, major, single episode, in partial remission ([place])    Under good control on current regimen. Continue current regimen. Continue to monitor. Call with any concerns. Refills given. Labs drawn today.       Relevant Medications   FLUoxetine (PROZAC) 40 MG capsule   Other Relevant Orders   CBC with Differential/Platelet   Comprehensive metabolic panel   Hyperlipidemia    Rechecking labs today. Await results. Treat as needed.       Relevant Medications   amLODipine (NORVASC) 5 MG tablet   hydrochlorothiazide (HYDRODIURIL)  25 MG tablet   valsartan (DIOVAN) 40 MG tablet   Other Relevant Orders   CBC with Differential/Platelet   Comprehensive metabolic panel   Lipid Panel w/o Chol/HDL Ratio   Other Visit Diagnoses     Chronic fatigue    -  Primary   Will check labs. If not improving by end of the month consider tick labs and thyroid.   Needs flu shot       Relevant Orders   Flu Vaccine QUAD High Dose(Fluad) (Completed)        Follow up plan: Return in about 6 months (around 01/03/2022).

## 2021-07-06 ENCOUNTER — Encounter: Payer: Self-pay | Admitting: Family Medicine

## 2021-07-07 LAB — CBC WITH DIFFERENTIAL/PLATELET
Basophils Absolute: 0 10*3/uL (ref 0.0–0.2)
Basos: 1 %
EOS (ABSOLUTE): 0.1 10*3/uL (ref 0.0–0.4)
Eos: 2 %
Hematocrit: 42.5 % (ref 37.5–51.0)
Hemoglobin: 14.4 g/dL (ref 13.0–17.7)
Immature Grans (Abs): 0 10*3/uL (ref 0.0–0.1)
Immature Granulocytes: 0 %
Lymphocytes Absolute: 1.4 10*3/uL (ref 0.7–3.1)
Lymphs: 31 %
MCH: 32.7 pg (ref 26.6–33.0)
MCHC: 33.9 g/dL (ref 31.5–35.7)
MCV: 96 fL (ref 79–97)
Monocytes Absolute: 0.3 10*3/uL (ref 0.1–0.9)
Monocytes: 7 %
Neutrophils Absolute: 2.6 10*3/uL (ref 1.4–7.0)
Neutrophils: 59 %
Platelets: 192 10*3/uL (ref 150–450)
RBC: 4.41 x10E6/uL (ref 4.14–5.80)
RDW: 13 % (ref 11.6–15.4)
WBC: 4.4 10*3/uL (ref 3.4–10.8)

## 2021-07-07 LAB — COMPREHENSIVE METABOLIC PANEL
ALT: 14 IU/L (ref 0–44)
AST: 19 IU/L (ref 0–40)
Albumin/Globulin Ratio: 1.9 (ref 1.2–2.2)
Albumin: 4.5 g/dL (ref 3.7–4.7)
Alkaline Phosphatase: 97 IU/L (ref 44–121)
BUN/Creatinine Ratio: 15 (ref 10–24)
BUN: 16 mg/dL (ref 8–27)
Bilirubin Total: 0.4 mg/dL (ref 0.0–1.2)
CO2: 24 mmol/L (ref 20–29)
Calcium: 9.4 mg/dL (ref 8.6–10.2)
Chloride: 96 mmol/L (ref 96–106)
Creatinine, Ser: 1.09 mg/dL (ref 0.76–1.27)
Globulin, Total: 2.4 g/dL (ref 1.5–4.5)
Glucose: 205 mg/dL — ABNORMAL HIGH (ref 70–99)
Potassium: 3.7 mmol/L (ref 3.5–5.2)
Sodium: 136 mmol/L (ref 134–144)
Total Protein: 6.9 g/dL (ref 6.0–8.5)
eGFR: 70 mL/min/{1.73_m2} (ref >59)

## 2021-07-07 LAB — LIPID PANEL W/O CHOL/HDL RATIO
Cholesterol, Total: 213 mg/dL — ABNORMAL HIGH (ref 100–199)
HDL: 44 mg/dL (ref >39)
LDL Chol Calc (NIH): 127 mg/dL — ABNORMAL HIGH (ref 0–99)
Triglycerides: 235 mg/dL — ABNORMAL HIGH (ref 0–149)
VLDL Cholesterol Cal: 42 mg/dL — ABNORMAL HIGH (ref 5–40)

## 2021-07-07 LAB — PSA: Prostate Specific Ag, Serum: 0.5 ng/mL (ref 0.0–4.0)

## 2021-07-07 LAB — TESTOSTERONE, FREE, TOTAL, SHBG
Sex Hormone Binding: 32.2 nmol/L (ref 19.3–76.4)
Testosterone, Free: 3.1 pg/mL — ABNORMAL LOW (ref 6.6–18.1)
Testosterone: 218 ng/dL — ABNORMAL LOW (ref 264–916)

## 2021-07-26 ENCOUNTER — Telehealth: Payer: Self-pay | Admitting: Family Medicine

## 2021-07-26 NOTE — Telephone Encounter (Signed)
Copied from [place] [id number]. Topic: General - Inquiry >> Jul 26, 2021  2:12 PM Loma Boston wrote: Reason for CRM: Pt has called in as went to the [place] and they stated he has an ear infection in both ears but said that he would need meds that would need to be prescribed by ENT He cannot be seen till next month @ ENT. Wants Dr Lenna Sciara to prescribe a med for him. Last seen 10/4. FU to advise [phone]

## 2021-07-26 NOTE — Telephone Encounter (Signed)
Would need to be seen for abx

## 2021-07-27 ENCOUNTER — Other Ambulatory Visit: Payer: Self-pay

## 2021-07-27 ENCOUNTER — Encounter: Payer: Self-pay | Admitting: Nurse Practitioner

## 2021-07-27 ENCOUNTER — Ambulatory Visit (INDEPENDENT_AMBULATORY_CARE_PROVIDER_SITE_OTHER): Payer: Medicare Other | Admitting: Nurse Practitioner

## 2021-07-27 VITALS — BP 135/82 | HR 61 | Ht 68.0 in | Wt 178.6 lb

## 2021-07-27 DIAGNOSIS — H60503 Unspecified acute noninfective otitis externa, bilateral: Secondary | ICD-10-CM

## 2021-07-27 DIAGNOSIS — H65113 Acute and subacute allergic otitis media (mucoid) (sanguinous) (serous), bilateral: Secondary | ICD-10-CM | POA: Diagnosis not present

## 2021-07-27 MED ORDER — AMOXICILLIN-POT CLAVULANATE 875-125 MG PO TABS: 1.0000 | ORAL_TABLET | Freq: Two times a day (BID) | ORAL | 0 refills | Status: AC

## 2021-07-27 MED ORDER — CIPRO HC 0.2-1 % OT SUSP: 3.0000 [drp] | Freq: Two times a day (BID) | OTIC | 0 refills | Status: DC

## 2021-07-27 NOTE — Progress Notes (Signed)
BP 135/82   Pulse 61   Ht 5' 8" (1.727 m)   Wt 178 lb 9.6 oz (81 kg)   SpO2 96%   BMI 27.16 kg/m    Subjective:    Patient ID: Zachary Hansen, male    DOB: 1941-04-05, 80 y.o.   MRN: [id number]  HPI: Zachary Hansen is a 80 y.o. male  Chief Complaint  Patient presents with   Ear Pain   EAR PAIN Duration:  2 weeks Involved ear(s): bilateral Severity:  moderate  Quality:  sharp Fever: no Otorrhea: left clear fluid Upper respiratory infection symptoms: no Pruritus: yes Hearing loss: yes Water immersion no Using Q-tips: no Recurrent otitis media: no Status: worse Treatments attempted: none  Relevant past medical, surgical, family and social history reviewed and updated as indicated. Interim medical history since our last visit reviewed. Allergies and medications reviewed and updated.  Review of Systems  HENT:  Positive for ear pain.    Per HPI unless specifically indicated above     Objective:    BP 135/82   Pulse 61   Ht 5' 8" (1.727 m)   Wt 178 lb 9.6 oz (81 kg)   SpO2 96%   BMI 27.16 kg/m   Wt Readings from Last 3 Encounters:  07/27/21 178 lb 9.6 oz (81 kg)  07/05/21 182 lb (82.6 kg)  01/25/21 182 lb 12.8 oz (82.9 kg)    Physical Exam Vitals and nursing note reviewed.  Constitutional:      General: He is not in acute distress.    Appearance: Normal appearance. He is not ill-appearing, toxic-appearing or diaphoretic.  HENT:     Head: Normocephalic.     Right Ear: External ear normal. Tenderness present. Tympanic membrane is erythematous.     Left Ear: External ear normal. Tenderness present. Tympanic membrane is erythematous.     Nose: Nose normal. No congestion or rhinorrhea.     Mouth/Throat:     Mouth: Mucous membranes are moist.  Eyes:     General:        Right eye: No discharge.        Left eye: No discharge.     Extraocular Movements: Extraocular movements intact.     Conjunctiva/sclera: Conjunctivae normal.     Pupils: Pupils are  equal, round, and reactive to light.  Cardiovascular:     Rate and Rhythm: Normal rate and regular rhythm.     Heart sounds: No murmur heard. Pulmonary:     Effort: Pulmonary effort is normal. No respiratory distress.     Breath sounds: Normal breath sounds. No wheezing, rhonchi or rales.  Abdominal:     General: Abdomen is flat. Bowel sounds are normal.  Musculoskeletal:     Cervical back: Normal range of motion and neck supple.  Skin:    General: Skin is warm and dry.     Capillary Refill: Capillary refill takes less than 2 seconds.  Neurological:     General: No focal deficit present.     Mental Status: He is alert and oriented to person, place, and time.  Psychiatric:        Mood and Affect: Mood normal.        Behavior: Behavior normal.        Thought Content: Thought content normal.        Judgment: Judgment normal.    Results for orders placed or performed in visit on 07/05/21  CBC with Differential/Platelet  Result Value Ref Range  WBC 4.4 3.4 - 10.8 x10E3/uL   RBC 4.41 4.14 - 5.80 x10E6/uL   Hemoglobin 14.4 13.0 - 17.7 g/dL   Hematocrit 42.5 37.5 - 51.0 %   MCV 96 79 - 97 fL   MCH 32.7 26.6 - 33.0 pg   MCHC 33.9 31.5 - 35.7 g/dL   RDW 13.0 11.6 - 15.4 %   Platelets 192 150 - 450 x10E3/uL   Neutrophils 59 Not Estab. %   Lymphs 31 Not Estab. %   Monocytes 7 Not Estab. %   Eos 2 Not Estab. %   Basos 1 Not Estab. %   Neutrophils Absolute 2.6 1.4 - 7.0 x10E3/uL   Lymphocytes Absolute 1.4 0.7 - 3.1 x10E3/uL   Monocytes Absolute 0.3 0.1 - 0.9 x10E3/uL   EOS (ABSOLUTE) 0.1 0.0 - 0.4 x10E3/uL   Basophils Absolute 0.0 0.0 - 0.2 x10E3/uL   Immature Granulocytes 0 Not Estab. %   Immature Grans (Abs) 0.0 0.0 - 0.1 x10E3/uL  Comprehensive metabolic panel  Result Value Ref Range   Glucose 205 (H) 70 - 99 mg/dL   BUN 16 8 - 27 mg/dL   Creatinine, Ser 1.09 0.76 - 1.27 mg/dL   eGFR 70 >59 mL/min/1.73   BUN/Creatinine Ratio 15 10 - 24   Sodium 136 134 - 144 mmol/L    Potassium 3.7 3.5 - 5.2 mmol/L   Chloride 96 96 - 106 mmol/L   CO2 24 20 - 29 mmol/L   Calcium 9.4 8.6 - 10.2 mg/dL   Total Protein 6.9 6.0 - 8.5 g/dL   Albumin 4.5 3.7 - 4.7 g/dL   Globulin, Total 2.4 1.5 - 4.5 g/dL   Albumin/Globulin Ratio 1.9 1.2 - 2.2   Bilirubin Total 0.4 0.0 - 1.2 mg/dL   Alkaline Phosphatase 97 44 - 121 IU/L   AST 19 0 - 40 IU/L   ALT 14 0 - 44 IU/L  Lipid Panel w/o Chol/HDL Ratio  Result Value Ref Range   Cholesterol, Total 213 (H) 100 - 199 mg/dL   Triglycerides 235 (H) 0 - 149 mg/dL   HDL 44 >39 mg/dL   VLDL Cholesterol Cal 42 (H) 5 - 40 mg/dL   LDL Chol Calc (NIH) 127 (H) 0 - 99 mg/dL  PSA  Result Value Ref Range   Prostate Specific Ag, Serum 0.5 0.0 - 4.0 ng/mL  Testosterone, free, total([facility])  Result Value Ref Range   Testosterone 218 (L) 264 - 916 ng/dL   Testosterone, Free 3.1 (L) 6.6 - 18.1 pg/mL   Sex Hormone Binding 32.2 19.3 - 76.4 nmol/L      Assessment & Plan:   Problem List Items Addressed This Visit   None Visit Diagnoses     Acute otitis externa of both ears, unspecified type    -  Primary   Cipro drops sent for patient during visit. Discussed how to properly use medication. Follow up if symptoms worsen or fail to improve.    Acute mucoid otitis media of both ears       Complete course of augmentin.  Discussed side effects and benefits of medication. Follow if symptoms worsen or fail to improve.   Relevant Medications   amoxicillin-clavulanate (AUGMENTIN) 875-125 MG tablet        Follow up plan: Return if symptoms worsen or fail to improve.

## 2021-07-27 NOTE — Telephone Encounter (Signed)
Lmom asking pt to call back to schedule an appt. °

## 2021-09-28 ENCOUNTER — Emergency Department
Admission: EM | Admit: 2021-09-28 | Discharge: 2021-09-28 | Discharge status: Against Medical Advice | Payer: Medicare Other | Source: Home / Self Care

## 2021-09-28 ENCOUNTER — Telehealth: Payer: Self-pay | Admitting: Family Medicine

## 2021-09-28 DIAGNOSIS — R059 Cough, unspecified: Secondary | ICD-10-CM | POA: Diagnosis not present

## 2021-09-28 DIAGNOSIS — J1282 Pneumonia due to coronavirus disease 2019: Secondary | ICD-10-CM | POA: Diagnosis not present

## 2021-09-28 DIAGNOSIS — M62838 Other muscle spasm: Secondary | ICD-10-CM | POA: Diagnosis not present

## 2021-09-28 DIAGNOSIS — J984 Other disorders of lung: Secondary | ICD-10-CM | POA: Diagnosis not present

## 2021-09-28 DIAGNOSIS — R251 Tremor, unspecified: Secondary | ICD-10-CM | POA: Diagnosis not present

## 2021-09-28 DIAGNOSIS — M791 Myalgia, unspecified site: Secondary | ICD-10-CM | POA: Diagnosis not present

## 2021-09-28 DIAGNOSIS — R079 Chest pain, unspecified: Secondary | ICD-10-CM | POA: Diagnosis not present

## 2021-09-28 DIAGNOSIS — M62830 Muscle spasm of back: Secondary | ICD-10-CM | POA: Diagnosis not present

## 2021-09-28 DIAGNOSIS — R0602 Shortness of breath: Secondary | ICD-10-CM | POA: Diagnosis not present

## 2021-09-28 DIAGNOSIS — Z743 Need for continuous supervision: Secondary | ICD-10-CM | POA: Diagnosis not present

## 2021-09-28 DIAGNOSIS — R0902 Hypoxemia: Secondary | ICD-10-CM | POA: Diagnosis not present

## 2021-09-28 DIAGNOSIS — F32A Depression, unspecified: Secondary | ICD-10-CM | POA: Diagnosis not present

## 2021-09-28 DIAGNOSIS — U071 COVID-19: Secondary | ICD-10-CM | POA: Diagnosis not present

## 2021-09-28 DIAGNOSIS — M549 Dorsalgia, unspecified: Secondary | ICD-10-CM | POA: Diagnosis not present

## 2021-09-28 DIAGNOSIS — R6889 Other general symptoms and signs: Secondary | ICD-10-CM | POA: Diagnosis not present

## 2021-09-28 DIAGNOSIS — R918 Other nonspecific abnormal finding of lung field: Secondary | ICD-10-CM | POA: Diagnosis not present

## 2021-09-28 DIAGNOSIS — Z791 Long term (current) use of non-steroidal anti-inflammatories (NSAID): Secondary | ICD-10-CM | POA: Diagnosis not present

## 2021-09-28 DIAGNOSIS — R519 Headache, unspecified: Secondary | ICD-10-CM | POA: Diagnosis not present

## 2021-09-28 DIAGNOSIS — I10 Essential (primary) hypertension: Secondary | ICD-10-CM | POA: Diagnosis not present

## 2021-09-28 DIAGNOSIS — G4733 Obstructive sleep apnea (adult) (pediatric): Secondary | ICD-10-CM | POA: Diagnosis not present

## 2021-09-28 DIAGNOSIS — Z9989 Dependence on other enabling machines and devices: Secondary | ICD-10-CM | POA: Diagnosis not present

## 2021-09-28 NOTE — Telephone Encounter (Signed)
Copied from [place] [id number]. Topic: Medicare AWV >> Sep 28, 2021 10:58 AM Lavonia Drafts wrote: Reason for CRM:  Left message for patient to call back and schedule the Medicare Annual Wellness Visit (AWV) virtually or by telephone.  Last AWV 01/09/20  Please schedule at anytime with [facility].  45 minute appointment  Any questions, please call me at [phone]

## 2021-09-29 DIAGNOSIS — J984 Other disorders of lung: Secondary | ICD-10-CM | POA: Diagnosis not present

## 2021-09-29 DIAGNOSIS — R918 Other nonspecific abnormal finding of lung field: Secondary | ICD-10-CM | POA: Diagnosis not present

## 2021-09-29 DIAGNOSIS — R059 Cough, unspecified: Secondary | ICD-10-CM | POA: Diagnosis not present

## 2021-10-11 ENCOUNTER — Encounter: Payer: Self-pay | Admitting: Family Medicine

## 2021-10-11 ENCOUNTER — Ambulatory Visit (INDEPENDENT_AMBULATORY_CARE_PROVIDER_SITE_OTHER): Payer: Medicare Other | Admitting: Family Medicine

## 2021-10-11 ENCOUNTER — Other Ambulatory Visit: Payer: Self-pay

## 2021-10-11 VITALS — BP 129/75 | HR 59 | Temp 97.7°F | Wt 176.0 lb

## 2021-10-11 DIAGNOSIS — U071 COVID-19: Secondary | ICD-10-CM

## 2021-10-11 DIAGNOSIS — G9331 Postviral fatigue syndrome: Secondary | ICD-10-CM | POA: Diagnosis not present

## 2021-10-11 DIAGNOSIS — J189 Pneumonia, unspecified organism: Secondary | ICD-10-CM | POA: Diagnosis not present

## 2021-10-11 NOTE — Progress Notes (Signed)
BP 129/75    Pulse (!) 59    Temp 97.7 F (36.5 C)    Wt 176 lb (79.8 kg)    SpO2 98%    BMI 26.76 kg/m    Subjective:    Patient ID: Zachary Hansen, male    DOB: Sep 19, 1941, 81 y.o.   MRN: [id number]  HPI: Zachary Hansen is a 81 y.o. male  Chief Complaint  Patient presents with   Hospitalization Follow-up    Patient was admitted to hospital for COVID symptoms. Patient was diagnosed with pneumonia. Patient still has some cough and feeling extremely tired    Transition of Care Hospital Follow up.   Hospital/Facility: [facility] D/C Physician: Dr. Thomes Cake D/C Date: 10/01/21  Records Requested: 10/11/21 Records Received: 10/11/21 Records Reviewed: 10/11/21  Diagnoses on Discharge: Pneumonia of LLL due to infectious organism, COVID  Date of interactive Contact within 48 hours of discharge: 2 calls made 10/04/21 and 10/05/21- no contact made, letter sent Contact was through: phone and letter  Date of 7 day or 14 day face-to-face visit:  10/11/21  within 14 days  Outpatient Encounter Medications as of 10/11/2021  Medication Sig Note   amLODipine (NORVASC) 5 MG tablet Take 1 tablet (5 mg total) by mouth daily.    Cyanocobalamin (VITAMIN B 12 PO) Take 1,000 mcg by mouth daily.    cyclobenzaprine (FLEXERIL) 10 MG tablet TAKE 1 TABLET BY MOUTH EVERYDAY AT BEDTIME    FLUoxetine (PROZAC) 40 MG capsule TAKE 1 CAPSULE BY MOUTH EVERY DAY    hydrochlorothiazide (HYDRODIURIL) 25 MG tablet Take 1 tablet (25 mg total) by mouth daily.    Misc Natural Products (OSTEO BI-FLEX JOINT SHIELD PO) Take by mouth.    Multiple Vitamin (MULTI-VITAMINS) TABS Take by mouth. 12/06/2015: Received from: [facility]   naproxen (NAPROSYN) 500 MG tablet Take 1 tablet (500 mg total) by mouth 2 (two) times daily as needed.    valsartan (DIOVAN) 40 MG tablet Take 2 tablets (80 mg total) by mouth daily.    VITAMIN E PO Take by mouth daily.    [DISCONTINUED] ciprofloxacin-hydrocortisone (CIPRO HC) OTIC  suspension Place 3 drops into both ears 2 (two) times daily. (Patient not taking: Reported on 10/11/2021)    No facility-administered encounter medications on file as of 10/11/2021.   Per Hospitalist: "Zachary Hansen is a 81 y.o. male with a past medical history significant for prostate cancer s/p TURP, HTN, and depression who presents with progressively worsening full-body tremors, headache, mild cough, and myalgias who is found to be Covid positive.   # COVID infection: Symptoms started 12/27. COVID + on 12/28. COVID vaccines include J&J x 2. Did not have new oxygen requirement. Workup with superimposed bacterial infection. Admitted iso symptoms and severe rigors. Treated with IV remdesivir, tylenol, Ibuprofen with improvement of symptoms. Never needed oxygen therapy. Recommend bivalent booster 30 days after infection.   # OSA: home CPAP nightly   # HTN: held home medications iso normotension. Restart at discharge.   # Depression: continued home SSRI.   #Back pain/Muscle spasm: continued on home flexeril 10 mg nightly PRN. Given lidocaine patch during hospitalization.   COVID-19 Vaccination: Needs updated bivalent booster -- 30 days after recent infection."    Diagnostic Tests Reviewed: XR Chest Portable  Result Date: 09/28/2021 EXAM: XR CHEST PORTABLE DATE: 09/28/2021 5:17 PM ACCESSION: [id number] UN DICTATED: 09/28/2021 5:48 PM INTERPRETATION LOCATION: [place] CLINICAL INDICATION: 81 years old Male with COUGH COMPARISON: None TECHNIQUE: Portable  Chest Radiograph. FINDINGS: Left lower lobe and retrocardiac opacity. No pleural fluid or pneumothorax. Unremarkable cardiomediastinal silhouette. 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   Left lower lobe and retrocardiac opacity which may represent atelectasis or infection.  CT Head Wo Contrast  Result Date: 09/28/2021 EXAM: Computed tomography, head or brain without contrast material. DATE: 09/28/2021 5:14 PM ACCESSION: [id number] UN  DICTATED: 09/28/2021 5:19 PM INTERPRETATION LOCATION: [place] CLINICAL INDICATION: 81 years old Male with involuntary tremors, headache eval for ICH/stroke ; Neuro deficit, acute, stroke suspected COMPARISON: None TECHNIQUE: Axial CT images of the head from skull base to vertex without contrast. FINDINGS: There is no midline shift. No mass lesion. There is no evidence of acute infarct. No acute intracranial hemorrhage. No fractures are evident. The sinuses are pneumatized. Motion artifact degrades study.   Motion artifact degrades study limiting evaluation of the posterior fossa. No acute intracranial abnormality.   XR Chest 2 views  Result Date: 09/29/2021 EXAM: XR CHEST 2 VIEWS DATE: 09/29/2021 3:03 PM ACCESSION: [id number] UN DICTATED: 09/29/2021 3:15 PM INTERPRETATION LOCATION: [place] CLINICAL INDICATION: 81 years old Male with COUGH COMPARISON: 09/28/2021 TECHNIQUE: PA and Lateral Chest Radiographs. FINDINGS: Linear opacity in the left lung base. No pleural effusion or pneumothorax. Unremarkable cardiomediastinal silhouette.   Linear left lung base opacity, likely atelectasis or scarring.   Disposition: Home with home health  Consults: None  Discharge Instructions:  Follow up here and COVID Booster within 30 days  Disease/illness Education: Discussed today  Home Health/Community Services Discussions/Referrals: In place  Establishment or re-establishment of referral orders for community resources: In place  Discussion with other health care providers: None  Assessment and Support of treatment regimen adherence: Excellent  Appointments Coordinated with:  Patient and wife  Education for self-management, independent living, and ADLs: Discussed today  Since getting out of the hospital, Zachary Hansen has been feeling very tired and his legs feel weak and shakey. He notes that he is otherwise feeling OK. The fevers, chills, cough, issues with breathing have all resolved. He has  no SOB. No chills/shakes. No fevers. He is unsure if he has been pushing it a little hard. He notes that he can't complete tasks after he starts them because he gets too tired.   Relevant past medical, surgical, family and social history reviewed and updated as indicated. Interim medical history since our last visit reviewed. Allergies and medications reviewed and updated.  Review of Systems  Constitutional:  Positive for fatigue. Negative for activity change, appetite change, chills, diaphoresis, fever and unexpected weight change.  HENT: Negative.    Respiratory: Negative.    Cardiovascular: Negative.   Gastrointestinal: Negative.   Musculoskeletal: Negative.   Neurological: Negative.   Psychiatric/Behavioral: Negative.     Per HPI unless specifically indicated above     Objective:    BP 129/75    Pulse (!) 59    Temp 97.7 F (36.5 C)    Wt 176 lb (79.8 kg)    SpO2 98%    BMI 26.76 kg/m   Wt Readings from Last 3 Encounters:  10/11/21 176 lb (79.8 kg)  07/27/21 178 lb 9.6 oz (81 kg)  07/05/21 182 lb (82.6 kg)    Physical Exam Vitals and nursing note reviewed.  Constitutional:      General: He is not in acute distress.    Appearance: Normal appearance. He is not ill-appearing, toxic-appearing or diaphoretic.  HENT:     Head: Normocephalic and atraumatic.     Right Ear: External ear normal.  Left Ear: External ear normal.     Nose: Nose normal.     Mouth/Throat:     Mouth: Mucous membranes are moist.     Pharynx: Oropharynx is clear.  Eyes:     General: No scleral icterus.       Right eye: No discharge.        Left eye: No discharge.     Extraocular Movements: Extraocular movements intact.     Conjunctiva/sclera: Conjunctivae normal.     Pupils: Pupils are equal, round, and reactive to light.  Cardiovascular:     Rate and Rhythm: Normal rate and regular rhythm.     Pulses: Normal pulses.     Heart sounds: Normal heart sounds. No murmur heard.   No friction rub.  No gallop.  Pulmonary:     Effort: Pulmonary effort is normal. No respiratory distress.     Breath sounds: Normal breath sounds. No stridor. No wheezing, rhonchi or rales.  Chest:     Chest wall: No tenderness.  Musculoskeletal:        General: Normal range of motion.     Cervical back: Normal range of motion and neck supple.  Skin:    General: Skin is warm and dry.     Capillary Refill: Capillary refill takes less than 2 seconds.     Coloration: Skin is not jaundiced or pale.     Findings: No bruising, erythema, lesion or rash.  Neurological:     General: No focal deficit present.     Mental Status: He is alert and oriented to person, place, and time. Mental status is at baseline.  Psychiatric:        Mood and Affect: Mood normal.        Behavior: Behavior normal.        Thought Content: Thought content normal.        Judgment: Judgment normal.    Results for orders placed or performed in visit on 07/05/21  CBC with Differential/Platelet  Result Value Ref Range   WBC 4.4 3.4 - 10.8 x10E3/uL   RBC 4.41 4.14 - 5.80 x10E6/uL   Hemoglobin 14.4 13.0 - 17.7 g/dL   Hematocrit 42.5 37.5 - 51.0 %   MCV 96 79 - 97 fL   MCH 32.7 26.6 - 33.0 pg   MCHC 33.9 31.5 - 35.7 g/dL   RDW 13.0 11.6 - 15.4 %   Platelets 192 150 - 450 x10E3/uL   Neutrophils 59 Not Estab. %   Lymphs 31 Not Estab. %   Monocytes 7 Not Estab. %   Eos 2 Not Estab. %   Basos 1 Not Estab. %   Neutrophils Absolute 2.6 1.4 - 7.0 x10E3/uL   Lymphocytes Absolute 1.4 0.7 - 3.1 x10E3/uL   Monocytes Absolute 0.3 0.1 - 0.9 x10E3/uL   EOS (ABSOLUTE) 0.1 0.0 - 0.4 x10E3/uL   Basophils Absolute 0.0 0.0 - 0.2 x10E3/uL   Immature Granulocytes 0 Not Estab. %   Immature Grans (Abs) 0.0 0.0 - 0.1 x10E3/uL  Comprehensive metabolic panel  Result Value Ref Range   Glucose 205 (H) 70 - 99 mg/dL   BUN 16 8 - 27 mg/dL   Creatinine, Ser 1.09 0.76 - 1.27 mg/dL   eGFR 70 >59 mL/min/1.73   BUN/Creatinine Ratio 15 10 - 24   Sodium 136  134 - 144 mmol/L   Potassium 3.7 3.5 - 5.2 mmol/L   Chloride 96 96 - 106 mmol/L   CO2 24 20 - 29 mmol/L  Calcium 9.4 8.6 - 10.2 mg/dL   Total Protein 6.9 6.0 - 8.5 g/dL   Albumin 4.5 3.7 - 4.7 g/dL   Globulin, Total 2.4 1.5 - 4.5 g/dL   Albumin/Globulin Ratio 1.9 1.2 - 2.2   Bilirubin Total 0.4 0.0 - 1.2 mg/dL   Alkaline Phosphatase 97 44 - 121 IU/L   AST 19 0 - 40 IU/L   ALT 14 0 - 44 IU/L  Lipid Panel w/o Chol/HDL Ratio  Result Value Ref Range   Cholesterol, Total 213 (H) 100 - 199 mg/dL   Triglycerides 235 (H) 0 - 149 mg/dL   HDL 44 >39 mg/dL   VLDL Cholesterol Cal 42 (H) 5 - 40 mg/dL   LDL Chol Calc (NIH) 127 (H) 0 - 99 mg/dL  PSA  Result Value Ref Range   Prostate Specific Ag, Serum 0.5 0.0 - 4.0 ng/mL  Testosterone, free, total([facility])  Result Value Ref Range   Testosterone 218 (L) 264 - 916 ng/dL   Testosterone, Free 3.1 (L) 6.6 - 18.1 pg/mL   Sex Hormone Binding 32.2 19.3 - 76.4 nmol/L      Assessment & Plan:   Problem List Items Addressed This Visit   None Visit Diagnoses     Postviral fatigue syndrome    -  Primary   Encouraged rest, vitamin C and zinc. Will check labs. Recheck about 1 month.    Relevant Orders   CBC with Differential/Platelet   Basic metabolic panel   VITAMIN D 25 Hydroxy (Vit-D Deficiency, Fractures)   Pneumonia of left lower lobe due to infectious organism       Resolved. Feeling better. Continue to monitor.    COVID       Resolved. Feeling better. Continue to monitor.         Follow up plan: Return in about 4 weeks (around 11/08/2021).  30 minutes spent with patient and wife today.

## 2021-10-12 ENCOUNTER — Other Ambulatory Visit: Payer: Self-pay | Admitting: Family Medicine

## 2021-10-12 DIAGNOSIS — N289 Disorder of kidney and ureter, unspecified: Secondary | ICD-10-CM

## 2021-10-12 LAB — CBC WITH DIFFERENTIAL/PLATELET
Basophils Absolute: 0.1 10*3/uL (ref 0.0–0.2)
Basos: 1 %
EOS (ABSOLUTE): 0.1 10*3/uL (ref 0.0–0.4)
Eos: 2 %
Hematocrit: 37.9 % (ref 37.5–51.0)
Hemoglobin: 13.1 g/dL (ref 13.0–17.7)
Immature Grans (Abs): 0 10*3/uL (ref 0.0–0.1)
Immature Granulocytes: 0 %
Lymphocytes Absolute: 1.9 10*3/uL (ref 0.7–3.1)
Lymphs: 35 %
MCH: 32.6 pg (ref 26.6–33.0)
MCHC: 34.6 g/dL (ref 31.5–35.7)
MCV: 94 fL (ref 79–97)
Monocytes Absolute: 0.6 10*3/uL (ref 0.1–0.9)
Monocytes: 11 %
Neutrophils Absolute: 2.7 10*3/uL (ref 1.4–7.0)
Neutrophils: 51 %
Platelets: 231 10*3/uL (ref 150–450)
RBC: 4.02 x10E6/uL — ABNORMAL LOW (ref 4.14–5.80)
RDW: 13.1 % (ref 11.6–15.4)
WBC: 5.3 10*3/uL (ref 3.4–10.8)

## 2021-10-12 LAB — BASIC METABOLIC PANEL
BUN/Creatinine Ratio: 20 (ref 10–24)
BUN: 30 mg/dL — ABNORMAL HIGH (ref 8–27)
CO2: 22 mmol/L (ref 20–29)
Calcium: 10.3 mg/dL — ABNORMAL HIGH (ref 8.6–10.2)
Chloride: 102 mmol/L (ref 96–106)
Creatinine, Ser: 1.5 mg/dL — ABNORMAL HIGH (ref 0.76–1.27)
Glucose: 134 mg/dL — ABNORMAL HIGH (ref 70–99)
Potassium: 4.1 mmol/L (ref 3.5–5.2)
Sodium: 141 mmol/L (ref 134–144)
eGFR: 47 mL/min/{1.73_m2} — ABNORMAL LOW (ref >59)

## 2021-10-12 LAB — VITAMIN D 25 HYDROXY (VIT D DEFICIENCY, FRACTURES): Vit D, 25-Hydroxy: 52.1 ng/mL (ref 30.0–100.0)

## 2021-10-13 DIAGNOSIS — U071 COVID-19: Secondary | ICD-10-CM | POA: Diagnosis not present

## 2021-10-20 DIAGNOSIS — H52223 Regular astigmatism, bilateral: Secondary | ICD-10-CM | POA: Diagnosis not present

## 2021-10-20 DIAGNOSIS — H2513 Age-related nuclear cataract, bilateral: Secondary | ICD-10-CM | POA: Diagnosis not present

## 2021-10-20 DIAGNOSIS — H43813 Vitreous degeneration, bilateral: Secondary | ICD-10-CM | POA: Diagnosis not present

## 2021-10-20 DIAGNOSIS — H5203 Hypermetropia, bilateral: Secondary | ICD-10-CM | POA: Diagnosis not present

## 2021-10-20 DIAGNOSIS — H524 Presbyopia: Secondary | ICD-10-CM | POA: Diagnosis not present

## 2021-10-28 ENCOUNTER — Other Ambulatory Visit: Payer: Medicare Other

## 2021-10-28 ENCOUNTER — Other Ambulatory Visit: Payer: Self-pay

## 2021-10-28 DIAGNOSIS — N289 Disorder of kidney and ureter, unspecified: Secondary | ICD-10-CM | POA: Diagnosis not present

## 2021-10-29 LAB — BASIC METABOLIC PANEL
BUN/Creatinine Ratio: 19 (ref 10–24)
BUN: 21 mg/dL (ref 8–27)
CO2: 26 mmol/L (ref 20–29)
Calcium: 9.6 mg/dL (ref 8.6–10.2)
Chloride: 101 mmol/L (ref 96–106)
Creatinine, Ser: 1.12 mg/dL (ref 0.76–1.27)
Glucose: 117 mg/dL — ABNORMAL HIGH (ref 70–99)
Potassium: 3.9 mmol/L (ref 3.5–5.2)
Sodium: 139 mmol/L (ref 134–144)
eGFR: 67 mL/min/{1.73_m2} (ref >59)

## 2021-10-31 ENCOUNTER — Encounter: Payer: Self-pay | Admitting: Family Medicine

## 2021-11-08 ENCOUNTER — Encounter: Payer: Self-pay | Admitting: Family Medicine

## 2021-11-08 ENCOUNTER — Ambulatory Visit (INDEPENDENT_AMBULATORY_CARE_PROVIDER_SITE_OTHER): Payer: Medicare Other | Admitting: Family Medicine

## 2021-11-08 ENCOUNTER — Other Ambulatory Visit: Payer: Self-pay

## 2021-11-08 VITALS — BP 103/58 | HR 57 | Temp 97.9°F | Wt 177.2 lb

## 2021-11-08 DIAGNOSIS — I952 Hypotension due to drugs: Secondary | ICD-10-CM | POA: Diagnosis not present

## 2021-11-08 DIAGNOSIS — G9331 Postviral fatigue syndrome: Secondary | ICD-10-CM | POA: Diagnosis not present

## 2021-11-08 NOTE — Progress Notes (Signed)
BP (!) 103/58    Pulse (!) 57    Temp 97.9 F (36.6 C) (Oral)    Wt 177 lb 3.2 oz (80.4 kg)    SpO2 96%    BMI 26.94 kg/m    Subjective:    Patient ID: Zachary Hansen, male    DOB: June 17, 1941, 81 y.o.   MRN: [id number]  HPI: Zachary Hansen is a 81 y.o. male  Chief Complaint  Patient presents with   Fatigue    1 month f/up   Fatigue after covid has resolved. Feeling back to himself  HYPOTENSION Hypertension status: overtreated  Satisfied with current treatment? yes Duration of hypertension: chronic BP monitoring frequency:  not checking BP range:  BP medication side effects:  no Medication compliance: excellent compliance Previous BP meds:amlodipine, HCTZ, valsartan Aspirin: no Recurrent headaches: no Visual changes: no Palpitations: no Dyspnea: no Chest pain: no Lower extremity edema: no Dizzy/lightheaded: yes  Relevant past medical, surgical, family and social history reviewed and updated as indicated. Interim medical history since our last visit reviewed. Allergies and medications reviewed and updated.  Review of Systems  Constitutional: Negative.   Respiratory: Negative.    Cardiovascular: Negative.   Gastrointestinal: Negative.   Musculoskeletal: Negative.   Psychiatric/Behavioral: Negative.     Per HPI unless specifically indicated above     Objective:    BP (!) 103/58    Pulse (!) 57    Temp 97.9 F (36.6 C) (Oral)    Wt 177 lb 3.2 oz (80.4 kg)    SpO2 96%    BMI 26.94 kg/m   Wt Readings from Last 3 Encounters:  11/08/21 177 lb 3.2 oz (80.4 kg)  10/11/21 176 lb (79.8 kg)  07/27/21 178 lb 9.6 oz (81 kg)    Physical Exam Vitals and nursing note reviewed.  Constitutional:      General: He is not in acute distress.    Appearance: Normal appearance. He is not ill-appearing, toxic-appearing or diaphoretic.  HENT:     Head: Normocephalic and atraumatic.     Right Ear: External ear normal.     Left Ear: External ear normal.     Nose: Nose  normal.     Mouth/Throat:     Mouth: Mucous membranes are moist.     Pharynx: Oropharynx is clear.  Eyes:     General: No scleral icterus.       Right eye: No discharge.        Left eye: No discharge.     Extraocular Movements: Extraocular movements intact.     Conjunctiva/sclera: Conjunctivae normal.     Pupils: Pupils are equal, round, and reactive to light.  Cardiovascular:     Rate and Rhythm: Normal rate and regular rhythm.     Pulses: Normal pulses.     Heart sounds: Normal heart sounds. No murmur heard.   No friction rub. No gallop.  Pulmonary:     Effort: Pulmonary effort is normal. No respiratory distress.     Breath sounds: Normal breath sounds. No stridor. No wheezing, rhonchi or rales.  Chest:     Chest wall: No tenderness.  Musculoskeletal:        General: Normal range of motion.     Cervical back: Normal range of motion and neck supple.  Skin:    General: Skin is warm and dry.     Capillary Refill: Capillary refill takes less than 2 seconds.     Coloration: Skin is not jaundiced  or pale.     Findings: No bruising, erythema, lesion or rash.  Neurological:     General: No focal deficit present.     Mental Status: He is alert and oriented to person, place, and time. Mental status is at baseline.  Psychiatric:        Mood and Affect: Mood normal.        Behavior: Behavior normal.        Thought Content: Thought content normal.        Judgment: Judgment normal.    Results for orders placed or performed in visit on 60/10/93  Basic metabolic panel  Result Value Ref Range   Glucose 117 (H) 70 - 99 mg/dL   BUN 21 8 - 27 mg/dL   Creatinine, Ser 1.12 0.76 - 1.27 mg/dL   eGFR 67 >59 mL/min/1.73   BUN/Creatinine Ratio 19 10 - 24   Sodium 139 134 - 144 mmol/L   Potassium 3.9 3.5 - 5.2 mmol/L   Chloride 101 96 - 106 mmol/L   CO2 26 20 - 29 mmol/L   Calcium 9.6 8.6 - 10.2 mg/dL      Assessment & Plan:   Problem List Items Addressed This Visit   None Visit  Diagnoses     Hypotension due to drugs    -  Primary   Will stop HCTZ and recheck in 3 months. Call if BP creeping up again. Continue to monitor.    Postviral fatigue syndrome       Resolved. Feeling back to himself. Call with any concerns.         Follow up plan: Return in about 3 months (around 02/05/2022).

## 2021-11-16 ENCOUNTER — Ambulatory Visit (INDEPENDENT_AMBULATORY_CARE_PROVIDER_SITE_OTHER): Payer: Medicare Other | Admitting: *Deleted

## 2021-11-16 DIAGNOSIS — Z0000 Encounter for general adult medical examination without abnormal findings: Secondary | ICD-10-CM | POA: Diagnosis not present

## 2021-11-16 NOTE — Progress Notes (Signed)
Subjective:   Zachary Hansen is a 81 y.o. male who presents for Medicare Annual/Subsequent preventive examination.I connected with  Zachary Hansen on 11/16/21 by a telephone enabled telemedicine application and verified that I am speaking with the correct person using two identifiers.   I discussed the limitations of evaluation and management by telemedicine. The patient expressed understanding and agreed to proceed.  Patient location: home  Provider location: Tele-Health not in office    Review of Systems     Cardiac Risk Factors include: advanced age (>63men, >60 women);hypertension;male gender     Objective:    Today's Vitals   11/16/21 0949  PainSc: 4    There is no height or weight on file to calculate BMI.  Advanced Directives 11/16/2021 01/24/2017  Does Patient Have a Medical Advance Directive? Yes Yes  Type of Advance Directive Healthcare Power of [place]  Does patient want to make changes to medical advance directive? - Yes (MAU/Ambulatory/Procedural Areas - Information given)  Copy of [place] in Chart? No - copy requested No - copy requested    Current Medications (verified) Outpatient Encounter Medications as of 11/16/2021  Medication Sig   amLODipine (NORVASC) 5 MG tablet Take 1 tablet (5 mg total) by mouth daily.   Cyanocobalamin (VITAMIN B 12 PO) Take 1,000 mcg by mouth daily.   cyclobenzaprine (FLEXERIL) 10 MG tablet TAKE 1 TABLET BY MOUTH EVERYDAY AT BEDTIME   FLUoxetine (PROZAC) 40 MG capsule TAKE 1 CAPSULE BY MOUTH EVERY DAY   Misc Natural Products (OSTEO BI-FLEX JOINT SHIELD PO) Take by mouth.   Multiple Vitamin (MULTI-VITAMINS) TABS Take by mouth.   naproxen (NAPROSYN) 500 MG tablet Take 1 tablet (500 mg total) by mouth 2 (two) times daily as needed.   valsartan (DIOVAN) 40 MG tablet Take 2 tablets (80 mg total) by mouth daily.   VITAMIN E PO Take by mouth daily.   No  facility-administered encounter medications on file as of 11/16/2021.    Allergies (verified) Lisinopril   History: Past Medical History:  Diagnosis Date   ADHD (attention deficit hyperactivity disorder)    Depression    Prostate cancer ([place])    Sleep apnea    Past Surgical History:  Procedure Laterality Date   NASAL SEPTUM SURGERY     prostate cancer surgery     THROAT SURGERY     Due to snoring   Family History  Problem Relation Age of Onset   Arthritis Mother    Heart disease Father    Cancer Father    Heart disease Sister    Cancer Sister    Sleep apnea Brother    Alzheimer's disease Maternal Grandfather    Heart disease Paternal Grandmother    Diverticulitis Sister    Sleep apnea Sister    Social History   Socioeconomic History   Marital status: Married    Spouse name: Not on file   Number of children: Not on file   Years of education: Not on file   Highest education level: Not on file  Occupational History   Not on file  Tobacco Use   Smoking status: Former    Types: Cigarettes    Quit date: 10/02/1968    Years since quitting: 53.1   Smokeless tobacco: Never  Vaping Use   Vaping Use: Never used  Substance and Sexual Activity   Alcohol use: Yes    Comment: 1 drink a day    Drug  use: No   Sexual activity: Never  Other Topics Concern   Not on file  Social History Narrative   Not on file   Social Determinants of Health   Financial Resource Strain: Low Risk    Difficulty of Paying Living Expenses: Not hard at all  Food Insecurity: No Food Insecurity   Worried About Charity fundraiser in the Last Year: Never true   [place] in the Last Year: Never true  Transportation Needs: No Transportation Needs   Lack of Transportation (Medical): No   Lack of Transportation (Non-Medical): No  Physical Activity: Inactive   Days of Exercise per Week: 0 days   Minutes of Exercise per Session: 0 min  Stress: No Stress Concern Present   Feeling of Stress  : Not at all  Social Connections: Moderately Integrated   Frequency of Communication with Friends and Family: Twice a week   Frequency of Social Gatherings with Friends and Family: More than three times a week   Attends Religious Services: More than 4 times per year   Active Member of [employer] or Organizations: No   Attends Archivist Meetings: Never   Marital Status: Married    Tobacco Counseling Counseling given: Not Answered   Clinical Intake:  Pre-visit preparation completed: Yes  Pain : 0-10 Pain Score: 4  Pain Type:  (body aches) Pain Onset: In the past 7 days Pain Relieving Factors: tylenol,  Pain Relieving Factors: tylenol,  Nutritional Risks: None Diabetes: No  How often do you need to have someone help you when you read instructions, pamphlets, or other written materials from your doctor or pharmacy?: 1 - Never  Diabetic?   no  Interpreter Needed?: No  Information entered by :: Leroy Kennedy LPN   Activities of Daily Living In your present state of health, do you have any difficulty performing the following activities: 11/16/2021 01/25/2021  Hearing? Y N  Vision? N Y  Difficulty concentrating or making decisions? N Y  Walking or climbing stairs? N N  Dressing or bathing? N N  Doing errands, shopping? N N  Preparing Food and eating ? N -  Using the Toilet? N -  In the past six months, have you accidently leaked urine? N -  Do you have problems with loss of bowel control? N -  Managing your Medications? N -  Managing your Finances? N -  Housekeeping or managing your Housekeeping? N -  Some recent data might be hidden    Patient Care Team: Valerie Roys, DO as PCP - General (Family Medicine)  Indicate any recent Medical Services you may have received from other than [facility] providers in the past year (date may be approximate).     Assessment:   This is a routine wellness examination for Zachary Hansen.  Hearing/Vision screen Hearing Screening -  Comments:: Bilateral hearing aids Vision Screening - Comments:: Up to date  Dr. Matilde Sprang  Dietary issues and exercise activities discussed: Current Exercise Habits: The patient does not participate in regular exercise at present, Exercise limited by: None identified   Goals Addressed             This Visit's Progress    Increase physical activity         Depression Screen PHQ 2/9 Scores 11/16/2021 11/08/2021 10/11/2021 07/05/2021 01/25/2021 07/12/2020 01/09/2020  PHQ - 2 Score 0 0 1 0 1 4 1   PHQ- 9 Score 6 5 7 7 7 9 6     Fall  Risk Fall Risk  11/16/2021 11/08/2021 01/25/2021 07/12/2020 01/09/2020  Falls in the past year? 0 0 0 0 0  Number falls in past yr: 0 0 0 0 0  Injury with Fall? 0 0 0 0 0  Risk for fall due to : - No Fall Risks No Fall Risks - -  Follow up Falls evaluation completed;Falls prevention discussed Falls evaluation completed Falls evaluation completed Falls evaluation completed -    FALL RISK PREVENTION PERTAINING TO THE HOME:  Any stairs in or around the home? Yes  If so, are there any without handrails? No  Home free of loose throw rugs in walkways, pet beds, electrical cords, etc? Yes  Adequate lighting in your home to reduce risk of falls? Yes   ASSISTIVE DEVICES UTILIZED TO PREVENT FALLS:  Life alert? No  Use of a cane, walker or w/c? No  Grab bars in the bathroom? Yes  Shower chair or bench in shower? No  Elevated toilet seat or a handicapped toilet? No   TIMED UP AND GO:  Was the test performed? No .    Cognitive Function:  Normal cognitive status assessed by direct observation by this Nurse Health Advisor. No abnormalities found.       6CIT Screen 01/09/2020 01/09/2020 05/28/2018 01/24/2017  What Year? 0 points 0 points 0 points 0 points  What month? 0 points 0 points 0 points 0 points  What time? 0 points 0 points 0 points 0 points  Count back from 20 0 points 0 points 0 points 0 points  Months in reverse 0 points 0 points 0 points 2 points  Repeat  phrase 0 points 0 points 0 points 0 points  Total Score 0 0 0 2    Immunizations Immunization History  Administered Date(s) Administered   Fluad Quad(high Dose 65+) 07/05/2021   Janssen (J&J) SARS-COV-2 Vaccination 04/19/2020, 01/26/2021   Pneumococcal Conjugate-13 03/17/2015, 05/28/2018   Pneumococcal Polysaccharide-23 10/24/2011, 10/02/2012   Td 05/28/2018   Tdap 10/24/2011   Zoster Recombinat (Shingrix) 11/04/2020   Zoster, Live 04/16/2012, 10/02/2012    TDAP status: Up to date  Flu Vaccine status: Up to date  Pneumococcal vaccine status: Up to date  Covid-19 vaccine status: Information provided on how to obtain vaccines.   Qualifies for Shingles Vaccine? Yes   Zostavax completed Yes   Shingrix Completed?: Yes  Screening Tests Health Maintenance  Topic Date Due   Zoster Vaccines- Shingrix (2 of 2) 12/30/2020   COVID-19 Vaccine (3 - Booster for Janssen series) 12/02/2021 (Originally 03/23/2021)   TETANUS/TDAP  05/28/2028   Pneumonia Vaccine 59+ Years old  Completed   INFLUENZA VACCINE  Completed   Hepatitis C Screening  Completed   HPV VACCINES  Aged Out   COLONOSCOPY (Pts 45-70yrs Insurance coverage will need to be confirmed)  Discontinued   Fecal DNA (Cologuard)  Discontinued    Health Maintenance  Health Maintenance Due  Topic Date Due   Zoster Vaccines- Shingrix (2 of 2) 12/30/2020    Colorectal cancer screening: No longer required.   Lung Cancer Screening: (Low Dose CT Chest recommended if Age 67-80 years, 30 pack-year currently smoking OR have quit w/in 15years.) does not qualify.   Lung Cancer Screening Referral:   Additional Screening:  Hepatitis C Screening: does not qualify; Completed 2021  Vision Screening: Recommended annual ophthalmology exams for early detection of glaucoma and other disorders of the eye. Is the patient up to date with their annual eye exam?  Yes  Who is  the provider or what is the name of the office in which the patient  attends annual eye exams? Dr. Matilde Sprang If pt is not established with a provider, would they like to be referred to a provider to establish care? No .   Dental Screening: Recommended annual dental exams for proper oral hygiene  Community Resource Referral / Chronic Care Management: CRR required this visit?  No   CCM required this visit?  No      Plan:     I have personally reviewed and noted the following in the patients chart:   Medical and social history Use of alcohol, tobacco or illicit drugs  Current medications and supplements including opioid prescriptions. Patient is not currently taking opioid prescriptions. Functional ability and status Nutritional status Physical activity Advanced directives List of other physicians Hospitalizations, surgeries, and ER visits in previous 12 months Vitals Screenings to include cognitive, depression, and falls Referrals and appointments  In addition, I have reviewed and discussed with patient certain preventive protocols, quality metrics, and best practice recommendations. A written personalized care plan for preventive services as well as general preventive health recommendations were provided to patient.     Leroy Kennedy, LPN   0/80/2233   Nurse Notes: Patient was having flu like symptoms  appointment scheduled 11-17-2021

## 2021-11-16 NOTE — Patient Instructions (Signed)
[name] , Thank you for taking time to come for your Medicare Wellness Visit. I appreciate your ongoing commitment to your health goals. Please review the following plan we discussed and let me know if I can assist you in the future.   Screening recommendations/referrals: Colonoscopy: no longer required Recommended yearly ophthalmology/optometry visit for glaucoma screening and checkup Recommended yearly dental visit for hygiene and checkup  Vaccinations: Influenza vaccine: up to date Pneumococcal vaccine: up to date Tdap vaccine: up to date Shingles vaccine: up to date    Advanced directives: not on file  Conditions/risks identified:   Next appointment: 02-06-2022 @ 11:00 [facility] 65 Years and Older, Male Preventive care refers to lifestyle choices and visits with your health care provider that can promote health and wellness. What does preventive care include? A yearly physical exam. This is also called an annual well check. Dental exams once or twice a year. Routine eye exams. Ask your health care provider how often you should have your eyes checked. Personal lifestyle choices, including: Daily care of your teeth and gums. Regular physical activity. Eating a healthy diet. Avoiding tobacco and drug use. Limiting alcohol use. Practicing safe sex. Taking low doses of aspirin every day. Taking vitamin and mineral supplements as recommended by your health care provider. What happens during an annual well check? The services and screenings done by your health care provider during your annual well check will depend on your age, overall health, lifestyle risk factors, and family history of disease. Counseling  Your health care provider may ask you questions about your: Alcohol use. Tobacco use. Drug use. Emotional well-being. Home and relationship well-being. Sexual activity. Eating habits. History of falls. Memory and ability to understand  (cognition). Work and work Statistician. Screening  You may have the following tests or measurements: Height, weight, and BMI. Blood pressure. Lipid and cholesterol levels. These may be checked every 5 years, or more frequently if you are over 63 years old. Skin check. Lung cancer screening. You may have this screening every year starting at age 53 if you have a 30-pack-year history of smoking and currently smoke or have quit within the past 15 years. Fecal occult blood test (FOBT) of the stool. You may have this test every year starting at age 53. Flexible sigmoidoscopy or colonoscopy. You may have a sigmoidoscopy every 5 years or a colonoscopy every 10 years starting at age 52. Prostate cancer screening. Recommendations will vary depending on your family history and other risks. Hepatitis C blood test. Hepatitis B blood test. Sexually transmitted disease (STD) testing. Diabetes screening. This is done by checking your blood sugar (glucose) after you have not eaten for a while (fasting). You may have this done every 1-3 years. Abdominal aortic aneurysm (AAA) screening. You may need this if you are a current or former smoker. Osteoporosis. You may be screened starting at age 14 if you are at high risk. Talk with your health care provider about your test results, treatment options, and if necessary, the need for more tests. Vaccines  Your health care provider may recommend certain vaccines, such as: Influenza vaccine. This is recommended every year. Tetanus, diphtheria, and acellular pertussis (Tdap, Td) vaccine. You may need a Td booster every 10 years. Zoster vaccine. You may need this after age 37. Pneumococcal 13-valent conjugate (PCV13) vaccine. One dose is recommended after age 58. Pneumococcal polysaccharide (PPSV23) vaccine. One dose is recommended after age 33. Talk to your health care provider about which screenings  and vaccines you need and how often you need them. This  information is not intended to replace advice given to you by your health care provider. Make sure you discuss any questions you have with your health care provider. Document Released: 10/15/2015 Document Revised: 06/07/2016 Document Reviewed: 07/20/2015 Elsevier Interactive Patient Education  2017 [place] Prevention in the Home Falls can cause injuries. They can happen to people of all ages. There are many things you can do to make your home safe and to help prevent falls. What can I do on the outside of my home? Regularly fix the edges of walkways and driveways and fix any cracks. Remove anything that might make you trip as you walk through a door, such as a raised step or threshold. Trim any bushes or trees on the path to your home. Use bright outdoor lighting. Clear any walking paths of anything that might make someone trip, such as rocks or tools. Regularly check to see if handrails are loose or broken. Make sure that both sides of any steps have handrails. Any raised decks and porches should have guardrails on the edges. Have any leaves, snow, or ice cleared regularly. Use sand or salt on walking paths during winter. Clean up any spills in your garage right away. This includes oil or grease spills. What can I do in the bathroom? Use night lights. Install grab bars by the toilet and in the tub and shower. Do not use towel bars as grab bars. Use non-skid mats or decals in the tub or shower. If you need to sit down in the shower, use a plastic, non-slip stool. Keep the floor dry. Clean up any water that spills on the floor as soon as it happens. Remove soap buildup in the tub or shower regularly. Attach bath mats securely with double-sided non-slip rug tape. Do not have throw rugs and other things on the floor that can make you trip. What can I do in the bedroom? Use night lights. Make sure that you have a light by your bed that is easy to reach. Do not use any sheets or  blankets that are too big for your bed. They should not hang down onto the floor. Have a firm chair that has side arms. You can use this for support while you get dressed. Do not have throw rugs and other things on the floor that can make you trip. What can I do in the kitchen? Clean up any spills right away. Avoid walking on wet floors. Keep items that you use a lot in easy-to-reach places. If you need to reach something above you, use a strong step stool that has a grab bar. Keep electrical cords out of the way. Do not use floor polish or wax that makes floors slippery. If you must use wax, use non-skid floor wax. Do not have throw rugs and other things on the floor that can make you trip. What can I do with my stairs? Do not leave any items on the stairs. Make sure that there are handrails on both sides of the stairs and use them. Fix handrails that are broken or loose. Make sure that handrails are as long as the stairways. Check any carpeting to make sure that it is firmly attached to the stairs. Fix any carpet that is loose or worn. Avoid having throw rugs at the top or bottom of the stairs. If you do have throw rugs, attach them to the floor with carpet tape.  Make sure that you have a light switch at the top of the stairs and the bottom of the stairs. If you do not have them, ask someone to add them for you. What else can I do to help prevent falls? Wear shoes that: Do not have high heels. Have rubber bottoms. Are comfortable and fit you well. Are closed at the toe. Do not wear sandals. If you use a stepladder: Make sure that it is fully opened. Do not climb a closed stepladder. Make sure that both sides of the stepladder are locked into place. Ask someone to hold it for you, if possible. Clearly mark and make sure that you can see: Any grab bars or handrails. First and last steps. Where the edge of each step is. Use tools that help you move around (mobility aids) if they are  needed. These include: Canes. Walkers. Scooters. Crutches. Turn on the lights when you go into a dark area. Replace any light bulbs as soon as they burn out. Set up your furniture so you have a clear path. Avoid moving your furniture around. If any of your floors are uneven, fix them. If there are any pets around you, be aware of where they are. Review your medicines with your doctor. Some medicines can make you feel dizzy. This can increase your chance of falling. Ask your doctor what other things that you can do to help prevent falls. This information is not intended to replace advice given to you by your health care provider. Make sure you discuss any questions you have with your health care provider. Document Released: 07/15/2009 Document Revised: 02/24/2016 Document Reviewed: 10/23/2014 Elsevier Interactive Patient Education  2017 [employer].

## 2021-11-17 ENCOUNTER — Ambulatory Visit (INDEPENDENT_AMBULATORY_CARE_PROVIDER_SITE_OTHER): Payer: Medicare Other | Admitting: Family Medicine

## 2021-11-17 ENCOUNTER — Other Ambulatory Visit: Payer: Self-pay

## 2021-11-17 ENCOUNTER — Encounter: Payer: Self-pay | Admitting: Family Medicine

## 2021-11-17 VITALS — BP 138/83 | HR 72 | Temp 99.3°F | Wt 177.0 lb

## 2021-11-17 DIAGNOSIS — R6889 Other general symptoms and signs: Secondary | ICD-10-CM | POA: Diagnosis not present

## 2021-11-17 LAB — VERITOR FLU A/B WAIVED
Influenza A: NEGATIVE
Influenza B: NEGATIVE

## 2021-11-17 MED ORDER — PREDNISONE 50 MG PO TABS: 50.0000 mg | ORAL_TABLET | Freq: Every day | ORAL | 0 refills | Status: DC

## 2021-11-17 MED ORDER — BENZONATATE 200 MG PO CAPS: 200.0000 mg | ORAL_CAPSULE | Freq: Two times a day (BID) | ORAL | 0 refills | Status: DC | PRN

## 2021-11-17 MED ORDER — HYDROCOD POLI-CHLORPHE POLI ER 10-8 MG/5ML PO SUER: 5.0000 mL | Freq: Two times a day (BID) | ORAL | 0 refills | Status: DC | PRN

## 2021-11-17 NOTE — Progress Notes (Signed)
BP 138/83    Pulse 72    Temp 99.3 F (37.4 C)    Wt 177 lb (80.3 kg)    SpO2 99%    BMI 26.91 kg/m    Subjective:    Patient ID: Zachary Hansen, male    DOB: 06/26/1941, 81 y.o.   MRN: [id number]  HPI: Zachary Hansen is a 81 y.o. male  Chief Complaint  Patient presents with   Generalized Body Aches    Patient has been having body aches, chills and fever    Cough    Patient states he is having cough and runny nose for 5 days.    UPPER RESPIRATORY TRACT INFECTION Duration: 5 days Worst symptom: cough and body aches Fever: yes Cough: yes Shortness of breath: yes Wheezing: yes Chest pain: no Chest tightness: yes Chest congestion: yes Nasal congestion: yes Runny nose: yes Post nasal drip: yes Sneezing: yes Sore throat: yes Swollen glands: no Sinus pressure: no Headache: yes Face pain: no Toothache: no Ear pain: no  Ear pressure: no  Eyes red/itching:yes Eye drainage/crusting: no  Vomiting: no Rash: no Fatigue: yes Sick contacts: unknown Strep contacts: no  Context: worse Recurrent sinusitis: no Relief with OTC cold/cough medications: no  Treatments attempted: none   Relevant past medical, surgical, family and social history reviewed and updated as indicated. Interim medical history since our last visit reviewed. Allergies and medications reviewed and updated.  Review of Systems  Constitutional: Negative.   HENT:  Positive for congestion, postnasal drip and rhinorrhea. Negative for dental problem, drooling, ear discharge, ear pain, facial swelling, hearing loss, mouth sores, nosebleeds, sinus pressure, sinus pain, sneezing, sore throat, tinnitus, trouble swallowing and voice change.   Eyes: Negative.   Respiratory:  Positive for cough and shortness of breath. Negative for apnea, choking, chest tightness, wheezing and stridor.   Cardiovascular: Negative.   Gastrointestinal: Negative.   Psychiatric/Behavioral: Negative.     Per HPI unless specifically  indicated above     Objective:    BP 138/83    Pulse 72    Temp 99.3 F (37.4 C)    Wt 177 lb (80.3 kg)    SpO2 99%    BMI 26.91 kg/m   Wt Readings from Last 3 Encounters:  11/17/21 177 lb (80.3 kg)  11/08/21 177 lb 3.2 oz (80.4 kg)  10/11/21 176 lb (79.8 kg)    Physical Exam Vitals and nursing note reviewed.  Constitutional:      General: He is not in acute distress.    Appearance: Normal appearance. He is normal weight. He is ill-appearing. He is not toxic-appearing or diaphoretic.  HENT:     Head: Normocephalic and atraumatic.     Right Ear: External ear normal.     Left Ear: External ear normal.     Nose: Nose normal.     Mouth/Throat:     Mouth: Mucous membranes are moist.     Pharynx: Oropharynx is clear.  Eyes:     General: No scleral icterus.       Right eye: No discharge.        Left eye: No discharge.     Extraocular Movements: Extraocular movements intact.     Conjunctiva/sclera: Conjunctivae normal.     Pupils: Pupils are equal, round, and reactive to light.  Cardiovascular:     Rate and Rhythm: Normal rate and regular rhythm.     Pulses: Normal pulses.     Heart sounds: Normal heart sounds.  No murmur heard.   No friction rub. No gallop.  Pulmonary:     Effort: Pulmonary effort is normal. No respiratory distress.     Breath sounds: Normal breath sounds. No stridor. No wheezing, rhonchi or rales.  Chest:     Chest wall: No tenderness.  Musculoskeletal:        General: Normal range of motion.     Cervical back: Normal range of motion and neck supple.  Skin:    General: Skin is warm and dry.     Capillary Refill: Capillary refill takes less than 2 seconds.     Coloration: Skin is not jaundiced or pale.     Findings: No bruising, erythema, lesion or rash.  Neurological:     General: No focal deficit present.     Mental Status: He is alert and oriented to person, place, and time. Mental status is at baseline.  Psychiatric:        Mood and Affect: Mood  normal.        Behavior: Behavior normal.        Thought Content: Thought content normal.        Judgment: Judgment normal.    Results for orders placed or performed in visit on 12/82/08  Basic metabolic panel  Result Value Ref Range   Glucose 117 (H) 70 - 99 mg/dL   BUN 21 8 - 27 mg/dL   Creatinine, Ser 1.12 0.76 - 1.27 mg/dL   eGFR 67 >59 mL/min/1.73   BUN/Creatinine Ratio 19 10 - 24   Sodium 139 134 - 144 mmol/L   Potassium 3.9 3.5 - 5.2 mmol/L   Chloride 101 96 - 106 mmol/L   CO2 26 20 - 29 mmol/L   Calcium 9.6 8.6 - 10.2 mg/dL      Assessment & Plan:   Problem List Items Addressed This Visit   None Visit Diagnoses     Flu-like symptoms    -  Primary   Flu negative. Concern for COVID. Given we're on day 5 will go take rapid test and call me. WIll treat with prednisone, tessalon and tussionex pending results.    Relevant Orders   Veritor Flu A/B Waived   Novel Coronavirus, NAA ([facility])        Follow up plan: Return if symptoms worsen or fail to improve.

## 2021-11-19 LAB — NOVEL CORONAVIRUS, NAA: SARS-CoV-2, NAA: NOT DETECTED

## 2021-11-21 ENCOUNTER — Other Ambulatory Visit: Payer: Self-pay | Admitting: Family Medicine

## 2021-11-21 ENCOUNTER — Ambulatory Visit: Payer: Self-pay | Admitting: *Deleted

## 2021-11-21 MED ORDER — DOXYCYCLINE HYCLATE 100 MG PO TABS
100.0000 mg | ORAL_TABLET | Freq: Two times a day (BID) | ORAL | 0 refills | Status: DC
Start: 2021-11-21 — End: 2022-02-08

## 2021-11-21 NOTE — Progress Notes (Signed)
doxycycline

## 2021-11-21 NOTE — Telephone Encounter (Signed)
°  Chief Complaint: shortness of breath. Constant coughing, requesting medication and refill of tussionex cough syrup. Symptoms: coughing up green mucus. Shortness of breath at rest. Was told to call back if symptoms worsen Frequency: today  Pertinent Negatives: Patient denies gasping for breath Disposition: [x] ED /[] Urgent Care (no appt availability in office) / [] Appointment(In office/virtual)/ []  [place] [facility]/ [] Home Care/ [] Refused Recommended Disposition /[] [place] [facility]/ [x]  Follow-up with PCP Additional Notes:   Requesting medication today from call  today regarding covid results. Please advise. Patient does not want to go to ED.   Reason for Disposition  [1] MODERATE difficulty breathing (e.g., speaks in phrases, SOB even at rest, pulse 100-120) AND [2] NEW-onset or WORSE than normal  Answer Assessment - Initial Assessment Questions 1. RESPIRATORY STATUS: "Describe your breathing?" (e.g., wheezing, shortness of breath, unable to speak, severe coughing)      Shortness of breath at rest, constant coughing up green mucus 2. ONSET: "When did this breathing problem begin?"      Last week  3. PATTERN "Does the difficult breathing come and go, or has it been constant since it started?"      Becoming more constant 4. SEVERITY: "How bad is your breathing?" (e.g., mild, moderate, severe)    - MILD: No SOB at rest, mild SOB with walking, speaks normally in sentences, can lie down, no retractions, pulse < 100.    - MODERATE: SOB at rest, SOB with minimal exertion and prefers to sit, cannot lie down flat, speaks in phrases, mild retractions, audible wheezing, pulse 100-120.    - SEVERE: Very SOB at rest, speaks in single words, struggling to breathe, sitting hunched forward, retractions, pulse > 120      Moderate  5. RECURRENT SYMPTOM: "Have you had difficulty breathing before?" If Yes, ask: "When was the last time?" and "What happened that time?"      Na  6. CARDIAC HISTORY:  "Do you have any history of heart disease?" (e.g., heart attack, angina, bypass surgery, angioplasty)      See hx 7. LUNG HISTORY: "Do you have any history of lung disease?"  (e.g., pulmonary embolus, asthma, emphysema)     Na  8. CAUSE: "What do you think is causing the breathing problem?"      Productive cough  9. OTHER SYMPTOMS: "Do you have any other symptoms? (e.g., dizziness, runny nose, cough, chest pain, fever)     Shortness of breath at rest and constant coughing . Now coughing up green mucus  10. O2 SATURATION MONITOR:  "Do you use an oxygen saturation monitor (pulse oximeter) at home?" If Yes, "What is your reading (oxygen level) today?" "What is your usual oxygen saturation reading?" (e.g., 95%)       na 11. PREGNANCY: "Is there any chance you are pregnant?" "When was your last menstrual period?"       na 12. TRAVEL: "Have you traveled out of the country in the last month?" (e.g., travel history, exposures)       na  Protocols used: Breathing Difficulty-A-AH

## 2021-11-22 NOTE — Telephone Encounter (Signed)
Spoke with patient and made him aware of Dr.Johnson's recommendations. Patient states he did pick up his prescription yesterday from his local pharmacy. Patient states he is feeling some better today. Patient was advised to give our office a call back if he has any concerns or still having symptoms after he completes his course of treatment.

## 2021-11-22 NOTE — Telephone Encounter (Signed)
Antibiotic sent through yesterday. COVID was negative. Messages should be in results review

## 2021-12-30 ENCOUNTER — Other Ambulatory Visit: Payer: Self-pay | Admitting: Family Medicine

## 2021-12-30 NOTE — Telephone Encounter (Signed)
Requested Prescriptions  ?Pending Prescriptions Disp Refills  ?? valsartan (DIOVAN) 40 MG tablet [Pharmacy Med Name: VALSARTAN 40 MG TABLET] 180 tablet 1  ?  Sig: TAKE 2 TABLETS BY MOUTH EVERY DAY  ?  ? Cardiovascular:  Angiotensin Receptor Blockers Passed - 12/30/2021  2:02 AM  ?  ?  Passed - Cr in normal range and within 180 days  ?  Creatinine, Ser  ?Date Value Ref Range Status  ?10/28/2021 1.12 0.76 - 1.27 mg/dL Final  ?   ?  ?  Passed - K in normal range and within 180 days  ?  Potassium  ?Date Value Ref Range Status  ?10/28/2021 3.9 3.5 - 5.2 mmol/L Final  ?   ?  ?  Passed - Patient is not pregnant  ?  ?  Passed - Last BP in normal range  ?  BP Readings from Last 1 Encounters:  ?11/17/21 138/83  ?   ?  ?  Passed - Valid encounter within last 6 months  ?  Recent Outpatient Visits   ?      ? 1 month ago Flu-like symptoms  ? [place] P, DO  ? 1 month ago Hypotension due to drugs  ? [place], Megan P, DO  ? 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 [place], [place] P, DO  ? 5 months ago Acute otitis externa of both ears, unspecified type  ? [place], NP  ? 5 months ago Chronic fatigue  ? [place], [place] P, DO  ?  ?  ?Future Appointments   ?        ? In 1 month Johnson, Barb Merino, DO [facility]  ?  ? ?  ?  ?  ? ? ?

## 2022-01-03 ENCOUNTER — Ambulatory Visit: Payer: Medicare Other | Admitting: Family Medicine

## 2022-01-03 ENCOUNTER — Other Ambulatory Visit: Payer: Self-pay | Admitting: Family Medicine

## 2022-01-04 NOTE — Telephone Encounter (Signed)
Requested medication (s) are due for refill today:   Amlodipine yes, HCTZ not on list ? ?Requested medication (s) are on the active medication list:   No for hydrochlorothiazide,   Yes for amlodipine ? ?Future visit scheduled:   Yes ? ? ?Last ordered: 07/05/2021 #90, 1 refill,    HCTZ not on med list.   Discontinued 11/08/2021 ? ?Returned because received a refill request for medication that has been discontinued.     ? ?Requested Prescriptions  ?Pending Prescriptions Disp Refills  ? amLODipine (NORVASC) 5 MG tablet [Pharmacy Med Name: AMLODIPINE BESYLATE 5 MG TAB] 90 tablet 1  ?  Sig: Take 1 tablet (5 mg total) by mouth daily.  ?  ? Cardiovascular: Calcium Channel Blockers 2 Passed - 01/03/2022  2:21 AM  ?  ?  Passed - Last BP in normal range  ?  BP Readings from Last 1 Encounters:  ?11/17/21 138/83  ?  ?  ?  ?  Passed - Last Heart Rate in normal range  ?  Pulse Readings from Last 1 Encounters:  ?11/17/21 72  ?  ?  ?  ?  Passed - Valid encounter within last 6 months  ?  Recent Outpatient Visits   ? ?      ? 1 month ago Flu-like symptoms  ? [place] P, DO  ? 1 month ago Hypotension due to drugs  ? [place], Megan P, DO  ? 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 [place], [place] P, DO  ? 5 months ago Acute otitis externa of both ears, unspecified type  ? [place], NP  ? 6 months ago Chronic fatigue  ? [place], [place] P, DO  ? ?  ?  ?Future Appointments   ? ?        ? In 1 month Johnson, Megan P, DO [facility]  ? ?  ? ?  ?  ?  ? hydrochlorothiazide (HYDRODIURIL) 25 MG tablet [Pharmacy Med Name: HYDROCHLOROTHIAZIDE 25 MG TAB] 90 tablet 1  ?  Sig: Take 1 tablet (25 mg total) by mouth daily.  ?  ? Cardiovascular: Diuretics - Thiazide Passed - 01/03/2022  2:21 AM  ?  ?  Passed - Cr in normal range and within 180 days  ?  Creatinine, Ser  ?Date Value Ref Range Status   ?10/28/2021 1.12 0.76 - 1.27 mg/dL Final  ?  ?  ?  ?  Passed - K in normal range and within 180 days  ?  Potassium  ?Date Value Ref Range Status  ?10/28/2021 3.9 3.5 - 5.2 mmol/L Final  ?  ?  ?  ?  Passed - Na in normal range and within 180 days  ?  Sodium  ?Date Value Ref Range Status  ?10/28/2021 139 134 - 144 mmol/L Final  ?  ?  ?  ?  Passed - Last BP in normal range  ?  BP Readings from Last 1 Encounters:  ?11/17/21 138/83  ?  ?  ?  ?  Passed - Valid encounter within last 6 months  ?  Recent Outpatient Visits   ? ?      ? 1 month ago Flu-like symptoms  ? [place] P, DO  ? 1 month ago Hypotension due to drugs  ? [place], [place] P, DO  ? 2 months ago Postviral fatigue  syndrome  ? [place], [place] P, DO  ? 5 months ago Acute otitis externa of both ears, unspecified type  ? [place], NP  ? 6 months ago Chronic fatigue  ? [place], [place] P, DO  ? ?  ?  ?Future Appointments   ? ?        ? In 1 month Johnson, Barb Merino, DO [facility]  ? ?  ? ?  ?  ?  ? ?

## 2022-02-06 ENCOUNTER — Ambulatory Visit: Payer: Medicare Other | Admitting: Physician Assistant

## 2022-02-08 ENCOUNTER — Encounter: Payer: Self-pay | Admitting: Internal Medicine

## 2022-02-08 ENCOUNTER — Ambulatory Visit (INDEPENDENT_AMBULATORY_CARE_PROVIDER_SITE_OTHER): Payer: Medicare Other | Admitting: Internal Medicine

## 2022-02-08 VITALS — BP 127/77 | HR 59 | Temp 98.1°F | Ht 67.99 in | Wt 177.6 lb

## 2022-02-08 DIAGNOSIS — R053 Chronic cough: Secondary | ICD-10-CM

## 2022-02-08 DIAGNOSIS — I10 Essential (primary) hypertension: Secondary | ICD-10-CM

## 2022-02-08 MED ORDER — FEXOFENADINE HCL 180 MG PO TABS: 180.0000 mg | ORAL_TABLET | Freq: Every day | ORAL | 1 refills | Status: DC

## 2022-02-08 NOTE — Progress Notes (Signed)
? ?BP 127/77   Pulse (!) 59   Temp 98.1 ?F (36.7 ?C) (Oral)   Ht 5' 7.99" (1.727 m)   Wt 177 lb 9.6 oz (80.6 kg)   SpO2 96%   BMI 27.01 kg/m?   ? ?Subjective:  ? ? Patient ID: Zachary Hansen, male    DOB: November 19, 1940, 81 y.o.   MRN: [id number] ? ?Chief Complaint  ?Patient presents with  ? Hypotensive  ?  HCTZ was stopped at last visit  ? Cough  ?  Follow up on persistent cough, patient states that the cough is still has a small cough every now and then.  ? ? ?HPI: ?Zachary Hansen is a 81 y.o. male ? ?Cough ?This is a new problem. Pertinent negatives include no chest pain, chills, ear congestion, ear pain, fever, headaches, heartburn, hemoptysis, myalgias, nasal congestion, postnasal drip, rash, rhinorrhea, sore throat, shortness of breath, weight loss or wheezing.  ?Hypertension ?This is a chronic problem. The current episode started more than 1 year ago. The problem is controlled. Pertinent negatives include no anxiety, blurred vision, chest pain, headaches, malaise/fatigue, neck pain, orthopnea, palpitations, peripheral edema, PND or shortness of breath.  ? ?Chief Complaint  ?Patient presents with  ? Hypotensive  ?  HCTZ was stopped at last visit  ? Cough  ?  Follow up on persistent cough, patient states that the cough is still has a small cough every now and then.  ? ? ?Relevant past medical, surgical, family and social history reviewed and updated as indicated. Interim medical history since our last visit reviewed. ?Allergies and medications reviewed and updated. ? ?Review of Systems  ?Constitutional:  Negative for chills, fever, malaise/fatigue and weight loss.  ?HENT:  Negative for ear pain, postnasal drip, rhinorrhea and sore throat.   ?Eyes:  Negative for blurred vision.  ?Respiratory:  Positive for cough. Negative for hemoptysis, shortness of breath and wheezing.   ?Cardiovascular:  Negative for chest pain, palpitations, orthopnea and PND.  ?Gastrointestinal:  Negative for heartburn.   ?Musculoskeletal:  Negative for myalgias and neck pain.  ?Skin:  Negative for rash.  ?Neurological:  Negative for headaches.  ? ?Per HPI unless specifically indicated above ? ?   ?Objective:  ?  ?BP 127/77   Pulse (!) 59   Temp 98.1 ?F (36.7 ?C) (Oral)   Ht 5' 7.99" (1.727 m)   Wt 177 lb 9.6 oz (80.6 kg)   SpO2 96%   BMI 27.01 kg/m?   ?Wt Readings from Last 3 Encounters:  ?02/08/22 177 lb 9.6 oz (80.6 kg)  ?11/17/21 177 lb (80.3 kg)  ?11/08/21 177 lb 3.2 oz (80.4 kg)  ?  ?Physical Exam ?Vitals and nursing note reviewed.  ?Constitutional:   ?   General: He is not in acute distress. ?   Appearance: Normal appearance. He is not ill-appearing or diaphoretic.  ?HENT:  ?   Head: Normocephalic and atraumatic.  ?   Right Ear: Tympanic membrane and external ear normal. There is no impacted cerumen.  ?   Left Ear: External ear normal.  ?   Nose: No congestion or rhinorrhea.  ?   Mouth/Throat:  ?   Pharynx: No oropharyngeal exudate or posterior oropharyngeal erythema.  ?Eyes:  ?   Conjunctiva/sclera: Conjunctivae normal.  ?   Pupils: Pupils are equal, round, and reactive to light.  ?Cardiovascular:  ?   Rate and Rhythm: Normal rate and regular rhythm.  ?   Heart sounds: No murmur heard. ?  No  friction rub. No gallop.  ?Pulmonary:  ?   Effort: No respiratory distress.  ?   Breath sounds: No stridor. No wheezing or rhonchi.  ?Chest:  ?   Chest wall: No tenderness.  ?Abdominal:  ?   General: Abdomen is flat. Bowel sounds are normal.  ?   Palpations: Abdomen is soft. There is no mass.  ?   Tenderness: There is no abdominal tenderness.  ?Musculoskeletal:  ?   Cervical back: Normal range of motion and neck supple. No rigidity or tenderness.  ?   Left lower leg: No edema.  ?Skin: ?   General: Skin is warm and dry.  ?Neurological:  ?   Mental Status: He is alert.  ? ? ?Results for orders placed or performed in visit on 11/17/21  ?Novel Coronavirus, NAA ([facility])  ? Specimen: Saline  ?Result Value Ref Range  ? SARS-CoV-2, NAA  Not Detected Not Detected  ?Veritor Flu A/B Waived  ?Result Value Ref Range  ? Influenza A Negative Negative  ? Influenza B Negative Negative  ? ?   ? ? ?Current Outpatient Medications:  ?  amLODipine (NORVASC) 5 MG tablet, TAKE 1 TABLET (5 MG TOTAL) BY MOUTH DAILY., Disp: 90 tablet, Rfl: 0 ?  Cyanocobalamin (VITAMIN B 12 PO), Take 1,000 mcg by mouth daily., Disp: , Rfl:  ?  cyclobenzaprine (FLEXERIL) 10 MG tablet, TAKE 1 TABLET BY MOUTH EVERYDAY AT BEDTIME, Disp: 90 tablet, Rfl: 1 ?  fexofenadine (ALLEGRA ALLERGY) 180 MG tablet, Take 1 tablet (180 mg total) by mouth daily., Disp: 30 tablet, Rfl: 1 ?  FLUoxetine (PROZAC) 40 MG capsule, TAKE 1 CAPSULE BY MOUTH EVERY DAY, Disp: 90 capsule, Rfl: 1 ?  Misc Natural Products (OSTEO BI-FLEX JOINT SHIELD PO), Take by mouth., Disp: , Rfl:  ?  Multiple Vitamin (MULTI-VITAMINS) TABS, Take by mouth., Disp: , Rfl:  ?  naproxen (NAPROSYN) 500 MG tablet, Take 1 tablet (500 mg total) by mouth 2 (two) times daily as needed., Disp: 180 tablet, Rfl: 1 ?  valsartan (DIOVAN) 40 MG tablet, TAKE 2 TABLETS BY MOUTH EVERY DAY, Disp: 180 tablet, Rfl: 0 ?  VITAMIN E PO, Take by mouth daily., Disp: , Rfl:   ? ? ?Assessment & Plan:  ? ?HTN : was seen in feb for hypotension hctz stopped. Is on amlodipine and valsartan for such  ?Continue current meds.  Medication compliance emphasised. pt advised to keep Bp logs. Pt verbalised understanding of the same. Pt to have a low salt diet . Exercise to reach a goal of at least 150 mins a week.  lifestyle modifications explained and pt understands importance of the above. ?Under good control on current regimen. Continue current regimen. Continue to monitor. Call with any concerns. Refills given. Labs drawn today. ? 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 ? ?Problem List Items Addressed This Visit   ?None ?  ? ?No orders of the defined types were placed in this encounter. ?  ? ?Meds ordered this encounter  ?Medications  ? fexofenadine  (ALLEGRA ALLERGY) 180 MG tablet  ?  Sig: Take 1 tablet (180 mg total) by mouth daily.  ?  Dispense:  30 tablet  ?  Refill:  1  ?  ? ?Follow up plan: ?Return in about 2 months (around 04/10/2022). ? ? ?

## 2022-02-22 ENCOUNTER — Ambulatory Visit: Payer: Medicare Other | Admitting: Physician Assistant

## 2022-03-08 ENCOUNTER — Other Ambulatory Visit: Payer: Self-pay | Admitting: Internal Medicine

## 2022-03-08 NOTE — Telephone Encounter (Signed)
Requested Prescriptions  Pending Prescriptions Disp Refills  . fexofenadine (ALLEGRA) 180 MG tablet [Pharmacy Med Name: FEXOFENADINE HCL 180 MG TABLET] 90 tablet 0    Sig: TAKE 1 TABLET BY MOUTH EVERY DAY     Ear, Nose, and Throat:  Antihistamines Passed - 03/08/2022  8:30 AM      Passed - Valid encounter within last 12 months    Recent Outpatient Visits          4 weeks ago Chronic cough   [place], MD   3 months ago Flu-like symptoms   [place], Megan P, DO   4 months ago Hypotension due to drugs   [employer], Megan P, DO   4 months ago Postviral fatigue syndrome   [place] P, DO   7 months ago Acute otitis externa of both ears, unspecified type   [facility] Jon Billings, NP      Future Appointments            In 2 months Wynetta Emery, Barb Merino, DO [employer], [place]

## 2022-03-31 ENCOUNTER — Ambulatory Visit: Payer: Medicare Other

## 2022-04-09 ENCOUNTER — Other Ambulatory Visit: Payer: Self-pay | Admitting: Family Medicine

## 2022-04-10 NOTE — Telephone Encounter (Signed)
Requested Prescriptions  Pending Prescriptions Disp Refills  . FLUoxetine (PROZAC) 40 MG capsule [Pharmacy Med Name: FLUOXETINE HCL 40 MG CAPSULE] 90 capsule 1    Sig: TAKE 1 CAPSULE BY MOUTH EVERY DAY     Psychiatry:  Antidepressants - SSRI Passed - 04/09/2022 11:18 AM      Passed - Completed PHQ-2 or PHQ-9 in the last 360 days      Passed - Valid encounter within last 6 months    Recent Outpatient Visits          2 months ago Chronic cough   [place], MD   4 months ago Flu-like symptoms   [employer], Megan P, DO   5 months ago Hypotension due to drugs   [employer], Megan P, DO   6 months ago Postviral fatigue syndrome   [employer], Megan P, DO   8 months ago Acute otitis externa of both ears, unspecified type   [place], NP      Future Appointments            In 1 month Johnson, Megan P, DO [place]           . amLODipine (NORVASC) 5 MG tablet [Pharmacy Med Name: AMLODIPINE BESYLATE 5 MG TAB] 90 tablet 0    Sig: TAKE 1 TABLET (5 MG TOTAL) BY MOUTH DAILY.     Cardiovascular: Calcium Channel Blockers 2 Passed - 04/09/2022 11:18 AM      Passed - Last BP in normal range    BP Readings from Last 1 Encounters:  02/08/22 127/77         Passed - Last Heart Rate in normal range    Pulse Readings from Last 1 Encounters:  02/08/22 (!) 59         Passed - Valid encounter within last 6 months    Recent Outpatient Visits          2 months ago Chronic cough   [place], [place], MD   4 months ago Flu-like symptoms   [place], [place], DO   5 months ago Hypotension due to drugs   [employer], Megan P, DO   6 months ago Postviral fatigue syndrome   [place] P, DO   8 months ago Acute otitis externa of both ears, unspecified type    [facility] Jon Billings, NP      Future Appointments            In 1 month Johnson, Barb Merino, DO [employer], [facility]

## 2022-04-18 DIAGNOSIS — H52223 Regular astigmatism, bilateral: Secondary | ICD-10-CM | POA: Diagnosis not present

## 2022-04-18 DIAGNOSIS — H43813 Vitreous degeneration, bilateral: Secondary | ICD-10-CM | POA: Diagnosis not present

## 2022-04-18 DIAGNOSIS — H2513 Age-related nuclear cataract, bilateral: Secondary | ICD-10-CM | POA: Diagnosis not present

## 2022-04-18 DIAGNOSIS — H5203 Hypermetropia, bilateral: Secondary | ICD-10-CM | POA: Diagnosis not present

## 2022-04-18 DIAGNOSIS — H524 Presbyopia: Secondary | ICD-10-CM | POA: Diagnosis not present

## 2022-05-11 ENCOUNTER — Ambulatory Visit (INDEPENDENT_AMBULATORY_CARE_PROVIDER_SITE_OTHER): Payer: Medicare Other | Admitting: Family Medicine

## 2022-05-11 ENCOUNTER — Encounter: Payer: Self-pay | Admitting: Family Medicine

## 2022-05-11 VITALS — BP 116/68 | HR 65 | Temp 98.2°F | Wt 179.9 lb

## 2022-05-11 DIAGNOSIS — C61 Malignant neoplasm of prostate: Secondary | ICD-10-CM

## 2022-05-11 DIAGNOSIS — E291 Testicular hypofunction: Secondary | ICD-10-CM

## 2022-05-11 DIAGNOSIS — Z0000 Encounter for general adult medical examination without abnormal findings: Secondary | ICD-10-CM

## 2022-05-11 DIAGNOSIS — I129 Hypertensive chronic kidney disease with stage 1 through stage 4 chronic kidney disease, or unspecified chronic kidney disease: Secondary | ICD-10-CM | POA: Diagnosis not present

## 2022-05-11 DIAGNOSIS — E782 Mixed hyperlipidemia: Secondary | ICD-10-CM

## 2022-05-11 DIAGNOSIS — F324 Major depressive disorder, single episode, in partial remission: Secondary | ICD-10-CM

## 2022-05-11 LAB — MICROALBUMIN, URINE WAIVED
Creatinine, Urine Waived: 300 mg/dL (ref 10–300)
Microalb, Ur Waived: 80 mg/L — ABNORMAL HIGH (ref 0–19)
Microalb/Creat Ratio: >30 mg/g — ABNORMAL HIGH (ref <30)

## 2022-05-11 LAB — URINALYSIS, ROUTINE W REFLEX MICROSCOPIC
Bilirubin, UA: NEGATIVE
Glucose, UA: NEGATIVE
Leukocytes,UA: NEGATIVE
Nitrite, UA: NEGATIVE
RBC, UA: NEGATIVE
Specific Gravity, UA: 1.025 (ref 1.005–1.030)
Urobilinogen, Ur: 0.2 mg/dL (ref 0.2–1.0)
pH, UA: 6.5 (ref 5.0–7.5)

## 2022-05-11 MED ORDER — AMLODIPINE BESYLATE 5 MG PO TABS: 5.0000 mg | ORAL_TABLET | Freq: Every day | ORAL | 1 refills | Status: DC

## 2022-05-11 MED ORDER — FLUOXETINE HCL 40 MG PO CAPS: 40.0000 mg | ORAL_CAPSULE | Freq: Every day | ORAL | 1 refills | Status: DC

## 2022-05-11 MED ORDER — CYCLOBENZAPRINE HCL 10 MG PO TABS
ORAL_TABLET | ORAL | 1 refills | Status: DC
Start: 2022-05-11 — End: 2024-01-22

## 2022-05-11 MED ORDER — VALSARTAN 40 MG PO TABS
80.0000 mg | ORAL_TABLET | Freq: Every day | ORAL | 1 refills | Status: DC
Start: 2022-05-11 — End: 2022-11-13

## 2022-05-11 NOTE — Assessment & Plan Note (Signed)
Rechecking labs today. Await results. Treat as needed.  °

## 2022-05-11 NOTE — Assessment & Plan Note (Signed)
Rechecking PSA today. Continue to monitor.

## 2022-05-11 NOTE — Assessment & Plan Note (Signed)
Rechecking labs. Not treating due to history of prostate cancer.

## 2022-05-11 NOTE — Assessment & Plan Note (Signed)
Under good control on current regimen. Continue current regimen. Continue to monitor. Call with any concerns. Refills given. Labs drawn today.   

## 2022-05-11 NOTE — Progress Notes (Signed)
BP 116/68   Pulse 65   Temp 98.2 F (36.8 C)   Wt 179 lb 14.4 oz (81.6 kg)   SpO2 96%   BMI 27.36 kg/m    Subjective:    Patient ID: Zachary Hansen, male    DOB: [birth year], 81 y.o.   MRN: [id number]  HPI: Zachary Hansen is a 81 y.o. male presenting on 05/11/2022 for comprehensive medical examination. Current medical complaints include:  HYPERTENSION / HYPERLIPIDEMIA Satisfied with current treatment? yes Duration of hypertension: chronic BP monitoring frequency: not checking BP medication side effects: no Past BP meds: amlodipine, valsartan Duration of hyperlipidemia: chronic Cholesterol medication side effects: not on anything Cholesterol supplements: none Past cholesterol medications: none Medication compliance: excellent compliance Aspirin: no Recent stressors: no Recurrent headaches: no Visual changes: no Palpitations: no Dyspnea: no Chest pain: no Lower extremity edema: no Dizzy/lightheaded: no  LOW TESTOSTERONE Duration: chronic Status: stable  Satisfied with current treatment:  not on anything Decreased libido: no Fatigue: yes Depressed mood: yes Muscle weakness: yes Erectile dysfunction: no  DEPRESSION Mood status: stable Satisfied with current treatment?: yes Symptom severity: mild  Duration of current treatment : chronic Side effects: no Medication compliance: excellent compliance Psychotherapy/counseling: no  Previous psychiatric medications: prozac Depressed mood: no Anxious mood: no Anhedonia: no Significant weight loss or gain: no Insomnia: no  Fatigue: no Feelings of worthlessness or guilt: no Impaired concentration/indecisiveness: no Suicidal ideations: no Hopelessness: no Crying spells: no    05/11/2022   11:25 AM 02/08/2022   11:28 AM 11/16/2021   10:02 AM 11/08/2021   10:39 AM 10/11/2021    9:12 AM  Depression screen PHQ 2/9  Decreased Interest 1 1 0 0 1  Down, Depressed, Hopeless 1 0 0 0 0  PHQ - 2 Score 2 1 0 0 1   Altered sleeping 0 '2 3 3 2  '$ Tired, decreased energy '3 2 3 2 3  '$ Change in appetite 0 0 0 0 0  Feeling bad or failure about yourself  0 0 0 0 0  Trouble concentrating 0 0 0 0 1  Moving slowly or fidgety/restless 0 0 0 0 0  Suicidal thoughts 0 0 0 0   PHQ-9 Score '5 5 6 5 7  '$ Difficult doing work/chores Not difficult at all Not difficult at all Not difficult at all Not difficult at all     He currently lives with: wife Interim Problems from his last visit: no  Depression Screen done today and results listed below:     05/11/2022   11:25 AM 02/08/2022   11:28 AM 11/16/2021   10:02 AM 11/08/2021   10:39 AM 10/11/2021    9:12 AM  Depression screen PHQ 2/9  Decreased Interest 1 1 0 0 1  Down, Depressed, Hopeless 1 0 0 0 0  PHQ - 2 Score 2 1 0 0 1  Altered sleeping 0 '2 3 3 2  '$ Tired, decreased energy '3 2 3 2 3  '$ Change in appetite 0 0 0 0 0  Feeling bad or failure about yourself  0 0 0 0 0  Trouble concentrating 0 0 0 0 1  Moving slowly or fidgety/restless 0 0 0 0 0  Suicidal thoughts 0 0 0 0   PHQ-9 Score '5 5 6 5 7  '$ Difficult doing work/chores Not difficult at all Not difficult at all Not difficult at all Not difficult at all     Past Medical History:  Past Medical History:  Diagnosis Date  ADHD (attention deficit hyperactivity disorder)    Depression    Prostate cancer [facility])    Sleep apnea     Surgical History:  Past Surgical History:  Procedure Laterality Date   NASAL SEPTUM SURGERY     prostate cancer surgery     THROAT SURGERY     Due to snoring    Medications:  Current Outpatient Medications on File Prior to Visit  Medication Sig   Cyanocobalamin (VITAMIN B 12 PO) Take 1,000 mcg by mouth daily.   Misc Natural Products (OSTEO BI-FLEX JOINT SHIELD PO) Take by mouth.   Multiple Vitamin (MULTI-VITAMINS) TABS Take by mouth.   naproxen (NAPROSYN) 500 MG tablet Take 1 tablet (500 mg total) by mouth 2 (two) times daily as needed.   VITAMIN E PO Take by mouth daily.   No  current facility-administered medications on file prior to visit.    Allergies:  Allergies  Allergen Reactions   Lisinopril Cough    Social History:  Social History   Socioeconomic History   Marital status: Married    Spouse name: Not on file   Number of children: Not on file   Years of education: Not on file   Highest education level: Not on file  Occupational History   Not on file  Tobacco Use   Smoking status: Former    Types: Cigarettes    Quit date: 10/02/1968    Years since quitting: 53.6   Smokeless tobacco: Never  Vaping Use   Vaping Use: Never used  Substance and Sexual Activity   Alcohol use: Yes    Comment: 1 drink a day    Drug use: No   Sexual activity: Never  Other Topics Concern   Not on file  Social History Narrative   Not on file   Social Determinants of Health   Financial Resource Strain: Low Risk  (11/16/2021)   Overall Financial Resource Strain (CARDIA)    Difficulty of Paying Living Expenses: Not hard at all  Food Insecurity: No Food Insecurity (11/16/2021)   Hunger Vital Sign    Worried About Running Out of Food in the Last Year: Never true    [place] in the Last Year: Never true  Transportation Needs: No Transportation Needs (11/16/2021)   PRAPARE - Hydrologist (Medical): No    Lack of Transportation (Non-Medical): No  Physical Activity: Inactive (11/16/2021)   Exercise Vital Sign    Days of Exercise per Week: 0 days    Minutes of Exercise per Session: 0 min  Stress: No Stress Concern Present (11/16/2021)   [place]    Feeling of Stress : Not at all  Social Connections: Moderately Integrated (11/16/2021)   Social Connection and Isolation Panel [NHANES]    Frequency of Communication with Friends and Family: Twice a week    Frequency of Social Gatherings with Friends and Family: More than three times a week    Attends Religious Services:  More than 4 times per year    Active Member of [employer] or Organizations: No    Attends Archivist Meetings: Never    Marital Status: Married  Human resources officer Violence: Not At Risk (11/16/2021)   Humiliation, Afraid, Rape, and Kick questionnaire    Fear of Current or Ex-Partner: No    Emotionally Abused: No    Physically Abused: No    Sexually Abused: No   Social History  Tobacco Use  Smoking Status Former   Types: Cigarettes   Quit date: 10/02/1968   Years since quitting: 53.6  Smokeless Tobacco Never   Social History   Substance and Sexual Activity  Alcohol Use Yes   Comment: 1 drink a day     Family History:  Family History  Problem Relation Age of Onset   Arthritis Mother    Heart disease Father    Cancer Father    Heart disease Sister    Cancer Sister    Sleep apnea Brother    Alzheimer's disease Maternal Grandfather    Heart disease Paternal Grandmother    Diverticulitis Sister    Sleep apnea Sister     Past medical history, surgical history, medications, allergies, family history and social history reviewed with patient today and changes made to appropriate areas of the chart.   Review of Systems  Constitutional: Negative.   HENT: Negative.    Eyes:  Positive for blurred vision. Negative for double vision, photophobia, pain, discharge and redness.  Respiratory: Negative.    Cardiovascular: Negative.   Gastrointestinal:  Positive for heartburn. Negative for abdominal pain, blood in stool, constipation, diarrhea, melena, nausea and vomiting.  Genitourinary: Negative.   Musculoskeletal: Negative.   Skin: Negative.   Neurological: Negative.   Endo/Heme/Allergies: Negative.   Psychiatric/Behavioral: Negative.     All other ROS negative except what is listed above and in the HPI.      Objective:    BP 116/68   Pulse 65   Temp 98.2 F (36.8 C)   Wt 179 lb 14.4 oz (81.6 kg)   SpO2 96%   BMI 27.36 kg/m   Wt Readings from Last 3 Encounters:   05/11/22 179 lb 14.4 oz (81.6 kg)  02/08/22 177 lb 9.6 oz (80.6 kg)  11/17/21 177 lb (80.3 kg)    Physical Exam Vitals and nursing note reviewed.  Constitutional:      General: He is not in acute distress.    Appearance: Normal appearance. He is obese. He is not ill-appearing, toxic-appearing or diaphoretic.  HENT:     Head: Normocephalic and atraumatic.     Right Ear: Tympanic membrane, ear canal and external ear normal. There is no impacted cerumen.     Left Ear: Tympanic membrane, ear canal and external ear normal. There is no impacted cerumen.     Nose: Nose normal. No congestion or rhinorrhea.     Mouth/Throat:     Mouth: Mucous membranes are moist.     Pharynx: Oropharynx is clear. No oropharyngeal exudate or posterior oropharyngeal erythema.  Eyes:     General: No scleral icterus.       Right eye: No discharge.        Left eye: No discharge.     Extraocular Movements: Extraocular movements intact.     Conjunctiva/sclera: Conjunctivae normal.     Pupils: Pupils are equal, round, and reactive to light.  Neck:     Vascular: No carotid bruit.  Cardiovascular:     Rate and Rhythm: Normal rate and regular rhythm.     Pulses: Normal pulses.     Heart sounds: No murmur heard.    No friction rub. No gallop.  Pulmonary:     Effort: Pulmonary effort is normal. No respiratory distress.     Breath sounds: Normal breath sounds. No stridor. No wheezing, rhonchi or rales.  Chest:     Chest wall: No tenderness.  Abdominal:     General: Abdomen  is flat. Bowel sounds are normal. There is no distension.     Palpations: Abdomen is soft. There is no mass.     Tenderness: There is no abdominal tenderness. There is no right CVA tenderness, left CVA tenderness, guarding or rebound.     Hernia: No hernia is present.  Genitourinary:    Comments: Genital exam deferred with shared decision making Musculoskeletal:        General: No swelling, tenderness, deformity or signs of injury.      Cervical back: Normal range of motion and neck supple. No rigidity. No muscular tenderness.     Right lower leg: No edema.     Left lower leg: No edema.  Lymphadenopathy:     Cervical: No cervical adenopathy.  Skin:    General: Skin is warm and dry.     Capillary Refill: Capillary refill takes less than 2 seconds.     Coloration: Skin is not jaundiced or pale.     Findings: No bruising, erythema, lesion or rash.  Neurological:     General: No focal deficit present.     Mental Status: He is alert and oriented to person, place, and time.     Cranial Nerves: No cranial nerve deficit.     Sensory: No sensory deficit.     Motor: No weakness.     Coordination: Coordination normal.     Gait: Gait normal.     Deep Tendon Reflexes: Reflexes normal.  Psychiatric:        Mood and Affect: Mood normal.        Behavior: Behavior normal.        Thought Content: Thought content normal.        Judgment: Judgment normal.     Results for orders placed or performed in visit on 11/17/21  Novel Coronavirus, NAA ([facility])   Specimen: Saline  Result Value Ref Range   SARS-CoV-2, NAA Not Detected Not Detected  Veritor Flu A/B Waived  Result Value Ref Range   Influenza A Negative Negative   Influenza B Negative Negative      Assessment & Plan:   Problem List Items Addressed This Visit       Endocrine   Hypogonadism in male    Rechecking labs. Not treating due to history of prostate cancer.       Relevant Orders   Comprehensive metabolic panel   CBC with Differential/Platelet   Testosterone, free, total([facility])     Genitourinary   Benign hypertensive renal disease    Under good control on current regimen. Continue current regimen. Continue to monitor. Call with any concerns. Refills given. Labs drawn today.       Relevant Orders   Comprehensive metabolic panel   CBC with Differential/Platelet   TSH   Urinalysis, Routine w reflex microscopic   Microalbumin, Urine Waived    Malignant neoplasm of prostate (HCC)    Rechecking PSA today. Continue to monitor.       Relevant Orders   Comprehensive metabolic panel   CBC with Differential/Platelet   PSA     Other   Depression, major, single episode, in partial remission ([place])    Under good control on current regimen. Continue current regimen. Continue to monitor. Call with any concerns. Refills given. Labs drawn today.       Relevant Medications   FLUoxetine (PROZAC) 40 MG capsule   Other Relevant Orders   Comprehensive metabolic panel   CBC with Differential/Platelet   Hyperlipidemia  Rechecking labs today. Await results. Treat as needed.       Relevant Medications   amLODipine (NORVASC) 5 MG tablet   valsartan (DIOVAN) 40 MG tablet   Other Relevant Orders   Comprehensive metabolic panel   CBC with Differential/Platelet   Lipid Panel w/o Chol/HDL Ratio   Other Visit Diagnoses     Routine general medical examination at a health care facility    -  Primary   Vaccines up to date. Screening labs checked today. Continue diet and exercise. Call with any concerns. Continue to monitor.         Discussed aspirin prophylaxis for myocardial infarction prevention and decision was it was not indicated  LABORATORY TESTING:  Health maintenance labs ordered today as discussed above.   The natural history of prostate cancer and ongoing controversy regarding screening and potential treatment outcomes of prostate cancer has been discussed with the patient. The meaning of a false positive PSA and a false negative PSA has been discussed. He indicates understanding of the limitations of this screening test and wishes to proceed with screening PSA testing.   IMMUNIZATIONS:   - Tdap: Tetanus vaccination status reviewed: last tetanus booster within 10 years. - Influenza: Postponed to flu season - Pneumovax: Up to date - Prevnar: Up to date - COVID: Up to date - HPV: Not applicable - Shingrix vaccine: Given  elsewhere  SCREENING: - Colonoscopy: Not applicable  Discussed with patient purpose of the colonoscopy is to detect colon cancer at curable precancerous or early stages   PATIENT COUNSELING:    Sexuality: Discussed sexually transmitted diseases, partner selection, use of condoms, avoidance of unintended pregnancy  and contraceptive alternatives.   Advised to avoid cigarette smoking.  I discussed with the patient that most people either abstain from alcohol or drink within safe limits (<=14/week and <=4 drinks/occasion for males, <=7/weeks and <= 3 drinks/occasion for females) and that the risk for alcohol disorders and other health effects rises proportionally with the number of drinks per week and how often a drinker exceeds daily limits.  Discussed cessation/primary prevention of drug use and availability of treatment for abuse.   Diet: Encouraged to adjust caloric intake to maintain  or achieve ideal body weight, to reduce intake of dietary saturated fat and total fat, to limit sodium intake by avoiding high sodium foods and not adding table salt, and to maintain adequate dietary potassium and calcium preferably from fresh fruits, vegetables, and low-fat dairy products.    stressed the importance of regular exercise  Injury prevention: Discussed safety belts, safety helmets, smoke detector, smoking near bedding or upholstery.   Dental health: Discussed importance of regular tooth brushing, flossing, and dental visits.   Follow up plan: NEXT PREVENTATIVE PHYSICAL DUE IN 1 YEAR. Return in about 6 months (around 11/11/2022).

## 2022-05-12 ENCOUNTER — Encounter: Payer: Self-pay | Admitting: Family Medicine

## 2022-05-17 LAB — COMPREHENSIVE METABOLIC PANEL
ALT: 54 IU/L — ABNORMAL HIGH (ref 0–44)
AST: 32 IU/L (ref 0–40)
Albumin/Globulin Ratio: 1.8 (ref 1.2–2.2)
Albumin: 4.6 g/dL (ref 3.8–4.8)
Alkaline Phosphatase: 203 IU/L — ABNORMAL HIGH (ref 44–121)
BUN/Creatinine Ratio: 21 (ref 10–24)
BUN: 30 mg/dL — ABNORMAL HIGH (ref 8–27)
Bilirubin Total: 0.8 mg/dL (ref 0.0–1.2)
CO2: 25 mmol/L (ref 20–29)
Calcium: 10.1 mg/dL (ref 8.6–10.2)
Chloride: 99 mmol/L (ref 96–106)
Creatinine, Ser: 1.44 mg/dL — ABNORMAL HIGH (ref 0.76–1.27)
Globulin, Total: 2.5 g/dL (ref 1.5–4.5)
Glucose: 169 mg/dL — ABNORMAL HIGH (ref 70–99)
Potassium: 3.9 mmol/L (ref 3.5–5.2)
Sodium: 136 mmol/L (ref 134–144)
Total Protein: 7.1 g/dL (ref 6.0–8.5)
eGFR: 50 mL/min/{1.73_m2} — ABNORMAL LOW (ref >59)

## 2022-05-17 LAB — LIPID PANEL W/O CHOL/HDL RATIO
Cholesterol, Total: 197 mg/dL (ref 100–199)
HDL: 57 mg/dL (ref >39)
LDL Chol Calc (NIH): 113 mg/dL — ABNORMAL HIGH (ref 0–99)
Triglycerides: 154 mg/dL — ABNORMAL HIGH (ref 0–149)
VLDL Cholesterol Cal: 27 mg/dL (ref 5–40)

## 2022-05-17 LAB — CBC WITH DIFFERENTIAL/PLATELET
Basophils Absolute: 0 10*3/uL (ref 0.0–0.2)
Basos: 1 %
EOS (ABSOLUTE): 0.1 10*3/uL (ref 0.0–0.4)
Eos: 2 %
Hematocrit: 37.1 % — ABNORMAL LOW (ref 37.5–51.0)
Hemoglobin: 13.3 g/dL (ref 13.0–17.7)
Immature Grans (Abs): 0 10*3/uL (ref 0.0–0.1)
Immature Granulocytes: 0 %
Lymphocytes Absolute: 1.2 10*3/uL (ref 0.7–3.1)
Lymphs: 29 %
MCH: 33.9 pg — ABNORMAL HIGH (ref 26.6–33.0)
MCHC: 35.8 g/dL — ABNORMAL HIGH (ref 31.5–35.7)
MCV: 95 fL (ref 79–97)
Monocytes Absolute: 0.4 10*3/uL (ref 0.1–0.9)
Monocytes: 10 %
Neutrophils Absolute: 2.5 10*3/uL (ref 1.4–7.0)
Neutrophils: 58 %
Platelets: 201 10*3/uL (ref 150–450)
RBC: 3.92 x10E6/uL — ABNORMAL LOW (ref 4.14–5.80)
RDW: 12.9 % (ref 11.6–15.4)
WBC: 4.2 10*3/uL (ref 3.4–10.8)

## 2022-05-17 LAB — TESTOSTERONE, FREE, TOTAL, SHBG
Sex Hormone Binding: 44 nmol/L (ref 19.3–76.4)
Testosterone, Free: 2.2 pg/mL — ABNORMAL LOW (ref 6.6–18.1)
Testosterone: 243 ng/dL — ABNORMAL LOW (ref 264–916)

## 2022-05-17 LAB — PSA: Prostate Specific Ag, Serum: 0.9 ng/mL (ref 0.0–4.0)

## 2022-05-17 LAB — TSH: TSH: 1.63 u[IU]/mL (ref 0.450–4.500)

## 2022-07-21 ENCOUNTER — Other Ambulatory Visit: Payer: Self-pay | Admitting: Family Medicine

## 2022-07-21 NOTE — Telephone Encounter (Signed)
Requested Prescriptions  Pending Prescriptions Disp Refills  . hydrochlorothiazide (HYDRODIURIL) 25 MG tablet [Pharmacy Med Name: HYDROCHLOROTHIAZIDE 25 MG TAB] 90 tablet 1    Sig: TAKE 1 TABLET (25 MG TOTAL) BY MOUTH DAILY.     Cardiovascular: Diuretics - Thiazide Failed - 07/21/2022  9:15 AM      Failed - Cr in normal range and within 180 days    Creatinine, Ser  Date Value Ref Range Status  05/11/2022 1.44 (H) 0.76 - 1.27 mg/dL Final         Passed - K in normal range and within 180 days    Potassium  Date Value Ref Range Status  05/11/2022 3.9 3.5 - 5.2 mmol/L Final         Passed - Na in normal range and within 180 days    Sodium  Date Value Ref Range Status  05/11/2022 136 134 - 144 mmol/L Final         Passed - Last BP in normal range    BP Readings from Last 1 Encounters:  05/11/22 116/68         Passed - Valid encounter within last 6 months    Recent Outpatient Visits          2 months ago Routine general medical examination at a health care facility   [facility], [place] P, DO   5 months ago Chronic cough   [place], MD   8 months ago Flu-like symptoms   [place], [place], DO   8 months ago Hypotension due to drugs   [employer], Megan P, DO   9 months ago Postviral fatigue syndrome   [place], [place], DO      Future Appointments            In 3 months Johnson, Barb Merino, DO [facility]

## 2022-11-01 ENCOUNTER — Telehealth: Payer: Self-pay | Admitting: Family Medicine

## 2022-11-01 NOTE — Telephone Encounter (Signed)
Copied from [place] [id number]. Topic: Medicare AWV >> Nov 01, 2022 10:42 AM Devoria Glassing wrote: Reason for CRM: Left message for patient to schedule Annual Wellness Visit.  Please schedule with Health Nurse Advisor Kirke Shaggy at [facility].Call [place] at [phone]

## 2022-11-13 ENCOUNTER — Encounter: Payer: Self-pay | Admitting: Family Medicine

## 2022-11-13 ENCOUNTER — Ambulatory Visit (INDEPENDENT_AMBULATORY_CARE_PROVIDER_SITE_OTHER): Payer: Medicare Other | Admitting: Family Medicine

## 2022-11-13 VITALS — BP 131/87 | HR 69 | Temp 98.7°F | Ht 67.0 in | Wt 179.3 lb

## 2022-11-13 DIAGNOSIS — C61 Malignant neoplasm of prostate: Secondary | ICD-10-CM | POA: Diagnosis not present

## 2022-11-13 DIAGNOSIS — I129 Hypertensive chronic kidney disease with stage 1 through stage 4 chronic kidney disease, or unspecified chronic kidney disease: Secondary | ICD-10-CM | POA: Diagnosis not present

## 2022-11-13 DIAGNOSIS — Z23 Encounter for immunization: Secondary | ICD-10-CM

## 2022-11-13 DIAGNOSIS — F324 Major depressive disorder, single episode, in partial remission: Secondary | ICD-10-CM

## 2022-11-13 DIAGNOSIS — E782 Mixed hyperlipidemia: Secondary | ICD-10-CM

## 2022-11-13 DIAGNOSIS — R5382 Chronic fatigue, unspecified: Secondary | ICD-10-CM

## 2022-11-13 MED ORDER — NAPROXEN 500 MG PO TABS: 500.0000 mg | ORAL_TABLET | Freq: Two times a day (BID) | ORAL | 1 refills | Status: DC | PRN

## 2022-11-13 MED ORDER — VALSARTAN 40 MG PO TABS: 80.0000 mg | ORAL_TABLET | Freq: Every day | ORAL | 1 refills | Status: DC

## 2022-11-13 MED ORDER — AMLODIPINE BESYLATE 5 MG PO TABS: 5.0000 mg | ORAL_TABLET | Freq: Every day | ORAL | 1 refills | Status: DC

## 2022-11-13 MED ORDER — FLUOXETINE HCL 40 MG PO CAPS: 40.0000 mg | ORAL_CAPSULE | Freq: Every day | ORAL | 1 refills | Status: DC

## 2022-11-13 NOTE — Assessment & Plan Note (Signed)
Under good control on current regimen. Continue current regimen. Continue to monitor. Call with any concerns. Refills given. Labs drawn today.   

## 2022-11-13 NOTE — Assessment & Plan Note (Signed)
Rechecking PSA today. Await results. Treat as needed.

## 2022-11-13 NOTE — Progress Notes (Signed)
BP 131/87   Pulse 69   Temp 98.7 F (37.1 C) (Oral)   Ht 5' 7"$  (1.702 m)   Wt 179 lb 4.8 oz (81.3 kg)   SpO2 97%   BMI 28.08 kg/m    Subjective:    Patient ID: Zachary Hansen, male    DOB: 04-Mar-1941, 82 y.o.   MRN: [id number]  HPI: Zachary Hansen is a 82 y.o. male  Chief Complaint  Patient presents with   Hyperlipidemia   Depression   HYPERTENSION / [place] Satisfied with current treatment? yes Duration of hypertension: chronic BP monitoring frequency: not checking BP medication side effects: no Past BP meds: valsartan, amlodipine Duration of hyperlipidemia: chronic Cholesterol medication side effects: no Cholesterol supplements: none Past cholesterol medications: none Medication compliance: excellent compliance Aspirin: no Recent stressors: no Recurrent headaches: no Visual changes: no Palpitations: no Dyspnea: no Chest pain: no Lower extremity edema: no Dizzy/lightheaded: no  DEPRESSION Mood status: stable Satisfied with current treatment?: yes Symptom severity: mild  Duration of current treatment : chronic Side effects: no Medication compliance: excellent compliance Psychotherapy/counseling: no  Previous psychiatric medications: fluoxetine Depressed mood: no Anxious mood: no Anhedonia: no Significant weight loss or gain: no Insomnia: no  Fatigue: yes Feelings of worthlessness or guilt: no Impaired concentration/indecisiveness: no Suicidal ideations: no Hopelessness: no Crying spells: no    11/13/2022    2:04 PM 05/11/2022   11:25 AM 02/08/2022   11:28 AM 11/16/2021   10:02 AM 11/08/2021   10:39 AM  Depression screen PHQ 2/9  Decreased Interest 0 1 1 0 0  Down, Depressed, Hopeless 0 1 0 0 0  PHQ - 2 Score 0 2 1 0 0  Altered sleeping 2 0 2 3 3  $ Tired, decreased energy 2 3 2 3 2  $ Change in appetite 0 0 0 0 0  Feeling bad or failure about yourself  0 0 0 0 0  Trouble concentrating 0 0 0 0 0  Moving slowly or fidgety/restless 0 0 0  0 0  Suicidal thoughts 0 0 0 0 0  PHQ-9 Score 4 5 5 6 5  $ Difficult doing work/chores  Not difficult at all Not difficult at all Not difficult at all Not difficult at all    Relevant past medical, surgical, family and social history reviewed and updated as indicated. Interim medical history since our last visit reviewed. Allergies and medications reviewed and updated.  Review of Systems  Constitutional:  Positive for fatigue. Negative for activity change, appetite change, chills, diaphoresis, fever and unexpected weight change.  Respiratory: Negative.    Cardiovascular: Negative.   Gastrointestinal: Negative.   Musculoskeletal: Negative.   Psychiatric/Behavioral: Negative.      Per HPI unless specifically indicated above     Objective:    BP 131/87   Pulse 69   Temp 98.7 F (37.1 C) (Oral)   Ht 5' 7"$  (1.702 m)   Wt 179 lb 4.8 oz (81.3 kg)   SpO2 97%   BMI 28.08 kg/m   Wt Readings from Last 3 Encounters:  11/13/22 179 lb 4.8 oz (81.3 kg)  05/11/22 179 lb 14.4 oz (81.6 kg)  02/08/22 177 lb 9.6 oz (80.6 kg)    Physical Exam Vitals and nursing note reviewed.  Constitutional:      General: He is not in acute distress.    Appearance: Normal appearance. He is normal weight. He is not ill-appearing, toxic-appearing or diaphoretic.  HENT:     Head: Normocephalic and atraumatic.  Right Ear: External ear normal.     Left Ear: External ear normal.     Nose: Nose normal.     Mouth/Throat:     Mouth: Mucous membranes are moist.     Pharynx: Oropharynx is clear.  Eyes:     General: No scleral icterus.       Right eye: No discharge.        Left eye: No discharge.     Extraocular Movements: Extraocular movements intact.     Conjunctiva/sclera: Conjunctivae normal.     Pupils: Pupils are equal, round, and reactive to light.  Cardiovascular:     Rate and Rhythm: Normal rate and regular rhythm.     Pulses: Normal pulses.     Heart sounds: Normal heart sounds. No murmur  heard.    No friction rub. No gallop.  Pulmonary:     Effort: Pulmonary effort is normal. No respiratory distress.     Breath sounds: Normal breath sounds. No stridor. No wheezing, rhonchi or rales.  Chest:     Chest wall: No tenderness.  Musculoskeletal:        General: Normal range of motion.     Cervical back: Normal range of motion and neck supple.  Skin:    General: Skin is warm and dry.     Capillary Refill: Capillary refill takes less than 2 seconds.     Coloration: Skin is not jaundiced or pale.     Findings: No bruising, erythema, lesion or rash.  Neurological:     General: No focal deficit present.     Mental Status: He is alert and oriented to person, place, and time. Mental status is at baseline.  Psychiatric:        Mood and Affect: Mood normal.        Behavior: Behavior normal.        Thought Content: Thought content normal.        Judgment: Judgment normal.     Results for orders placed or performed in visit on 05/11/22  Comprehensive metabolic panel  Result Value Ref Range   Glucose 169 (H) 70 - 99 mg/dL   BUN 30 (H) 8 - 27 mg/dL   Creatinine, Ser 1.44 (H) 0.76 - 1.27 mg/dL   eGFR 50 (L) >59 mL/min/1.73   BUN/Creatinine Ratio 21 10 - 24   Sodium 136 134 - 144 mmol/L   Potassium 3.9 3.5 - 5.2 mmol/L   Chloride 99 96 - 106 mmol/L   CO2 25 20 - 29 mmol/L   Calcium 10.1 8.6 - 10.2 mg/dL   Total Protein 7.1 6.0 - 8.5 g/dL   Albumin 4.6 3.8 - 4.8 g/dL   Globulin, Total 2.5 1.5 - 4.5 g/dL   Albumin/Globulin Ratio 1.8 1.2 - 2.2   Bilirubin Total 0.8 0.0 - 1.2 mg/dL   Alkaline Phosphatase 203 (H) 44 - 121 IU/L   AST 32 0 - 40 IU/L   ALT 54 (H) 0 - 44 IU/L  CBC with Differential/Platelet  Result Value Ref Range   WBC 4.2 3.4 - 10.8 x10E3/uL   RBC 3.92 (L) 4.14 - 5.80 x10E6/uL   Hemoglobin 13.3 13.0 - 17.7 g/dL   Hematocrit 37.1 (L) 37.5 - 51.0 %   MCV 95 79 - 97 fL   MCH 33.9 (H) 26.6 - 33.0 pg   MCHC 35.8 (H) 31.5 - 35.7 g/dL   RDW 12.9 11.6 - 15.4 %    Platelets 201 150 - 450 x10E3/uL  Neutrophils 58 Not Estab. %   Lymphs 29 Not Estab. %   Monocytes 10 Not Estab. %   Eos 2 Not Estab. %   Basos 1 Not Estab. %   Neutrophils Absolute 2.5 1.4 - 7.0 x10E3/uL   Lymphocytes Absolute 1.2 0.7 - 3.1 x10E3/uL   Monocytes Absolute 0.4 0.1 - 0.9 x10E3/uL   EOS (ABSOLUTE) 0.1 0.0 - 0.4 x10E3/uL   Basophils Absolute 0.0 0.0 - 0.2 x10E3/uL   Immature Granulocytes 0 Not Estab. %   Immature Grans (Abs) 0.0 0.0 - 0.1 x10E3/uL  Lipid Panel w/o Chol/HDL Ratio  Result Value Ref Range   Cholesterol, Total 197 100 - 199 mg/dL   Triglycerides 154 (H) 0 - 149 mg/dL   HDL 57 >39 mg/dL   VLDL Cholesterol Cal 27 5 - 40 mg/dL   LDL Chol Calc (NIH) 113 (H) 0 - 99 mg/dL  PSA  Result Value Ref Range   Prostate Specific Ag, Serum 0.9 0.0 - 4.0 ng/mL  TSH  Result Value Ref Range   TSH 1.630 0.450 - 4.500 uIU/mL  Urinalysis, Routine w reflex microscopic  Result Value Ref Range   Specific Gravity, UA 1.025 1.005 - 1.030   pH, UA 6.5 5.0 - 7.5   Color, UA Yellow Yellow   Appearance Ur Clear Clear   Leukocytes,UA Negative Negative   Protein,UA Trace (A) Negative/Trace   Glucose, UA Negative Negative   Ketones, UA Trace (A) Negative   RBC, UA Negative Negative   Bilirubin, UA Negative Negative   Urobilinogen, Ur 0.2 0.2 - 1.0 mg/dL   Nitrite, UA Negative Negative  Microalbumin, Urine Waived  Result Value Ref Range   Microalb, Ur Waived 80 (H) 0 - 19 mg/L   Creatinine, Urine Waived 300 10 - 300 mg/dL   Microalb/Creat Ratio >30 (H) <30 mg/g  Testosterone, free, total([facility])  Result Value Ref Range   Testosterone 243 (L) 264 - 916 ng/dL   Testosterone, Free 2.2 (L) 6.6 - 18.1 pg/mL   Sex Hormone Binding 44.0 19.3 - 76.4 nmol/L      Assessment & Plan:   Problem List Items Addressed This Visit       Genitourinary   Benign hypertensive renal disease - Primary    Under good control on current regimen. Continue current regimen. Continue to  monitor. Call with any concerns. Refills given. Labs drawn today.        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 Continue current regimen. Continue to monitor. Call with any concerns. Refills given. Labs drawn today.       Relevant Medications   FLUoxetine (PROZAC) 40 MG capsule   Other Relevant Orders   Comprehensive metabolic panel   Hyperlipidemia    Under good control on current regimen. Continue current regimen. Continue to monitor. Call with any concerns. Refills given. Labs drawn today.       Relevant Medications   amLODipine (NORVASC) 5 MG tablet   valsartan (DIOVAN) 40 MG tablet   Other Relevant Orders   Comprehensive metabolic panel   Lipid Panel w/o Chol/HDL Ratio   Other Visit Diagnoses     Chronic fatigue       Likely multifactorial. Will check labs. Await results. Treat as needed.  Relevant Orders   CBC with Differential/Platelet   Comprehensive metabolic panel   VITAMIN D 25 Hydroxy (Vit-D Deficiency, Fractures)   TSH   Needs flu shot       Flu shot given today   Relevant Orders   Flu Vaccine QUAD High Dose(Fluad) (Completed)        Follow up plan: Return in about 6 months (around 05/14/2023) for physical.

## 2022-11-13 NOTE — Progress Notes (Signed)
Nurse Practitioner Fellow- please see note by Park Liter, DO for official note.     Established Patient Office Visit  Subjective   Patient ID: Zachary Hansen, male    DOB: 1940/11/26  Age: 82 y.o. MRN: [id number]  Chief Complaint  Patient presents with   Hyperlipidemia   Depression    HYPERTENSION / [place] Satisfied with current treatment? yes Duration of hypertension: chronic BP monitoring frequency: not checking BP range: Not checking  BP medication side effects: no Past BP meds: None  Duration of hyperlipidemia: chronic Cholesterol medication side effects: no Cholesterol supplements: none Past cholesterol medications: Not currently taking.  Medication compliance: Not currently taking Aspirin: no Recent stressors: yes Recurrent headaches: no Visual changes: yes; due for cataract surgery 01/2023 Palpitations: no Dyspnea: yes Chest pain: no Lower extremity edema: yes, not often.  Dizzy/lightheaded: yes; once every two weeks, last for 2 minutes, occurs with light/ streneous activity. Resolves pretty quickly.  Exercising x 1-2 monthly, would like to work on this.    DEPRESSION Mood status: stable Satisfied with current treatment?: yes Symptom severity: mild  Duration of current treatment : months Side effects: no Medication compliance: excellent compliance Psychotherapy/counseling: no in the past Previous psychiatric medications: Prozac Depressed mood: no Anxious mood: no Anhedonia: no Significant weight loss or gain: no Insomnia: no, sleep too much, sleeps 10 hours.  Fatigue: yes, daily, can sleep half the day away.  Feelings of worthlessness or guilt: no Impaired concentration/indecisiveness: no Suicidal ideations: no Hopelessness: no Crying spells: no            Review of Systems  Constitutional: Negative.   Respiratory: Negative.    Cardiovascular: Negative.   Psychiatric/Behavioral: Negative.        Objective:     BP  131/87   Pulse 69   Temp 98.7 F (37.1 C) (Oral)   Ht 5' 7"$  (1.702 m)   Wt 179 lb 4.8 oz (81.3 kg)   SpO2 97%   BMI 28.08 kg/m    Physical Exam Constitutional:      Appearance: Normal appearance.  HENT:     Head: Normocephalic.  Cardiovascular:     Rate and Rhythm: Regular rhythm.     Heart sounds: Normal heart sounds.  Pulmonary:     Effort: Pulmonary effort is normal.     Breath sounds: Normal breath sounds.  Musculoskeletal:        General: Normal range of motion.  Neurological:     Mental Status: He is alert and oriented to person, place, and time.      No results found for any visits on 11/13/22.    The 10-year ASCVD risk score (Arnett DK, et al., 2019) is: 35.5%    Assessment & Plan:   Problem List Items Addressed This Visit       Genitourinary   Benign hypertensive renal disease - Primary    Under good control on current regimen. Continue current regimen. Continue to monitor. Call with any concerns. Refills given. Labs drawn today.        Relevant Orders   Comprehensive metabolic panel   Malignant neoplasm of prostate ([place])    Rechecking PSA today. Await results. Treat as needed.       Relevant Orders   Comprehensive metabolic panel   PSA     Other   Depression, major, single episode, in partial remission ([place])    Under good control on current regimen. Continue current regimen. Continue to monitor. Call with any concerns.  Refills given. Labs drawn today.       Relevant Medications   FLUoxetine (PROZAC) 40 MG capsule   Other Relevant Orders   Comprehensive metabolic panel   Hyperlipidemia    Under good control on current regimen. Continue current regimen. Continue to monitor. Call with any concerns. Refills given. Labs drawn today.       Relevant Medications   amLODipine (NORVASC) 5 MG tablet   valsartan (DIOVAN) 40 MG tablet   Other Relevant Orders   Comprehensive metabolic panel   Lipid Panel w/o Chol/HDL Ratio   Other Visit  Diagnoses     Chronic fatigue       Likely multifactorial. Will check labs. Await results. Treat as needed.   Relevant Orders   CBC with Differential/Platelet   Comprehensive metabolic panel   VITAMIN D 25 Hydroxy (Vit-D Deficiency, Fractures)   TSH   Needs flu shot       Flu shot given today   Relevant Orders   Flu Vaccine QUAD High Dose(Fluad) (Completed)       Return in about 6 months (around 05/14/2023) for physical.    Francesca Jewett

## 2022-11-14 ENCOUNTER — Encounter: Payer: Self-pay | Admitting: Family Medicine

## 2022-11-14 LAB — CBC WITH DIFFERENTIAL/PLATELET
Basophils Absolute: 0.1 10*3/uL (ref 0.0–0.2)
Basos: 1 %
EOS (ABSOLUTE): 0.1 10*3/uL (ref 0.0–0.4)
Eos: 2 %
Hematocrit: 42.5 % (ref 37.5–51.0)
Hemoglobin: 15 g/dL (ref 13.0–17.7)
Immature Grans (Abs): 0 10*3/uL (ref 0.0–0.1)
Immature Granulocytes: 0 %
Lymphocytes Absolute: 2.1 10*3/uL (ref 0.7–3.1)
Lymphs: 38 %
MCH: 32.5 pg (ref 26.6–33.0)
MCHC: 35.3 g/dL (ref 31.5–35.7)
MCV: 92 fL (ref 79–97)
Monocytes Absolute: 0.6 10*3/uL (ref 0.1–0.9)
Monocytes: 11 %
Neutrophils Absolute: 2.7 10*3/uL (ref 1.4–7.0)
Neutrophils: 48 %
Platelets: 230 10*3/uL (ref 150–450)
RBC: 4.61 x10E6/uL (ref 4.14–5.80)
RDW: 12.8 % (ref 11.6–15.4)
WBC: 5.5 10*3/uL (ref 3.4–10.8)

## 2022-11-14 LAB — COMPREHENSIVE METABOLIC PANEL
ALT: 17 IU/L (ref 0–44)
AST: 19 IU/L (ref 0–40)
Albumin/Globulin Ratio: 1.8 (ref 1.2–2.2)
Albumin: 4.8 g/dL (ref 3.8–4.8)
Alkaline Phosphatase: 107 IU/L (ref 44–121)
BUN/Creatinine Ratio: 17 (ref 10–24)
BUN: 20 mg/dL (ref 8–27)
Bilirubin Total: 0.7 mg/dL (ref 0.0–1.2)
CO2: 23 mmol/L (ref 20–29)
Calcium: 10.2 mg/dL (ref 8.6–10.2)
Chloride: 100 mmol/L (ref 96–106)
Creatinine, Ser: 1.19 mg/dL (ref 0.76–1.27)
Globulin, Total: 2.7 g/dL (ref 1.5–4.5)
Glucose: 112 mg/dL — ABNORMAL HIGH (ref 70–99)
Potassium: 4.3 mmol/L (ref 3.5–5.2)
Sodium: 138 mmol/L (ref 134–144)
Total Protein: 7.5 g/dL (ref 6.0–8.5)
eGFR: 62 mL/min/{1.73_m2} (ref >59)

## 2022-11-14 LAB — LIPID PANEL W/O CHOL/HDL RATIO
Cholesterol, Total: 225 mg/dL — ABNORMAL HIGH (ref 100–199)
HDL: 46 mg/dL (ref >39)
LDL Chol Calc (NIH): 128 mg/dL — ABNORMAL HIGH (ref 0–99)
Triglycerides: 290 mg/dL — ABNORMAL HIGH (ref 0–149)
VLDL Cholesterol Cal: 51 mg/dL — ABNORMAL HIGH (ref 5–40)

## 2022-11-14 LAB — VITAMIN D 25 HYDROXY (VIT D DEFICIENCY, FRACTURES): Vit D, 25-Hydroxy: 36.4 ng/mL (ref 30.0–100.0)

## 2022-11-14 LAB — TSH: TSH: 2.09 u[IU]/mL (ref 0.450–4.500)

## 2022-11-14 LAB — PSA: Prostate Specific Ag, Serum: 0.8 ng/mL (ref 0.0–4.0)

## 2022-11-23 ENCOUNTER — Telehealth: Payer: Self-pay | Admitting: Family Medicine

## 2022-11-23 NOTE — Telephone Encounter (Signed)
Patient called in and got his lab results, concerned about his psa being high and requested to speak with Dr Durenda Age nurse about it

## 2022-11-23 NOTE — Telephone Encounter (Signed)
Spoke with patient and informed him that his recent PSA result was within normal limits. Patient verbalized understanding and has no further questions at this time.

## 2022-11-23 NOTE — Telephone Encounter (Signed)
Copied from [place] [id number]. Topic: Medicare AWV >> Nov 23, 2022 10:16 AM Devoria Glassing wrote: Reason for CRM: Called patient to schedule Medicare Annual Wellness Visit (AWV). No voicemail available to leave a message.  Last date of AWV: 11/16/2021  Please schedule an appointment at any time with Kirke Shaggy, [facility]    If any questions, please contact me.  Thank you ,  Sherol Dade; [place] Direct Dial: [phone]

## 2023-01-15 ENCOUNTER — Ambulatory Visit (INDEPENDENT_AMBULATORY_CARE_PROVIDER_SITE_OTHER): Payer: Medicare Other

## 2023-01-15 VITALS — Ht 68.0 in | Wt 183.6 lb

## 2023-01-15 DIAGNOSIS — Z0000 Encounter for general adult medical examination without abnormal findings: Secondary | ICD-10-CM

## 2023-01-15 NOTE — Progress Notes (Signed)
Subjective:   Zachary Hansen is a 82 y.o. male who presents for Medicare Annual/Subsequent preventive examination.  Review of Systems     Cardiac Risk Factors include: advanced age (>27men, >55 women);hypertension;male gender;sedentary lifestyle     Objective:    Today's Vitals   01/15/23 1039  Weight: 183 lb 9.6 oz (83.3 kg)  Height:  (1.727 m)   Body mass index is 27.92 kg/m.     01/15/2023   10:44 AM 11/16/2021    9:55 AM 01/24/2017   10:44 AM  Advanced Directives  Does Patient Have a Medical Advance Directive? No Yes Yes  Type of Advance Directive  Healthcare Power of Attorney Living will;Healthcare Power of Attorney  Does patient want to make changes to medical advance directive?   Yes (MAU/Ambulatory/Procedural Areas - Information given)  Copy of Healthcare Power of Attorney in Chart?  No - copy requested No - copy requested  Would patient like information on creating a medical advance directive? No - Patient declined      Current Medications (verified) Outpatient Encounter Medications as of 01/15/2023  Medication Sig   amLODipine (NORVASC) 5 MG tablet Take 1 tablet (5 mg total) by mouth daily.   Cyanocobalamin (VITAMIN B 12 PO) Take 1,000 mcg by mouth daily.   cyclobenzaprine (FLEXERIL) 10 MG tablet TAKE 1 TABLET BY MOUTH EVERYDAY AT BEDTIME   FLUoxetine (PROZAC) 40 MG capsule Take 1 capsule (40 mg total) by mouth daily.   Misc Natural Products (OSTEO BI-FLEX JOINT SHIELD PO) Take by mouth.   Multiple Vitamin (MULTI-VITAMINS) TABS Take by mouth.   naproxen (NAPROSYN) 500 MG tablet Take 1 tablet (500 mg total) by mouth 2 (two) times daily as needed.   valsartan (DIOVAN) 40 MG tablet Take 2 tablets (80 mg total) by mouth daily.   VITAMIN E PO Take by mouth daily.   No facility-administered encounter medications on file as of 01/15/2023.    Allergies (verified) Lisinopril   History: Past Medical History:  Diagnosis Date   ADHD (attention deficit  hyperactivity disorder)    Depression    Prostate cancer    Sleep apnea    Past Surgical History:  Procedure Laterality Date   NASAL SEPTUM SURGERY     prostate cancer surgery     THROAT SURGERY     Due to snoring   Family History  Problem Relation Age of Onset   Arthritis Mother    Heart disease Father    Cancer Father    Heart disease Sister    Cancer Sister    Sleep apnea Brother    Alzheimer's disease Maternal Grandfather    Heart disease Paternal Grandmother    Diverticulitis Sister    Sleep apnea Sister    Social History   Socioeconomic History   Marital status: Married    Spouse name: Not on file   Number of children: Not on file   Years of education: Not on file   Highest education level: Not on file  Occupational History   Not on file  Tobacco Use   Smoking status: Former    Types: Cigarettes    Quit date: 10/02/1968    Years since quitting: 54.3   Smokeless tobacco: Never  Vaping Use   Vaping Use: Never used  Substance and Sexual Activity   Alcohol use: Yes    Comment: 1 drink a day    Drug use: No   Sexual activity: Never  Other Topics Concern   Not  on file  Social History Narrative   Not on file   Social Determinants of Health   Financial Resource Strain: Low Risk  (01/15/2023)   Overall Financial Resource Strain (CARDIA)    Difficulty of Paying Living Expenses: Not hard at all  Food Insecurity: No Food Insecurity (01/15/2023)   Hunger Vital Sign    Worried About Running Out of Food in the Last Year: Never true    Ran Out of Food in the Last Year: Never true  Transportation Needs: No Transportation Needs (01/15/2023)   PRAPARE - Administrator, Civil Service (Medical): No    Lack of Transportation (Non-Medical): No  Physical Activity: Inactive (01/15/2023)   Exercise Vital Sign    Days of Exercise per Week: 0 days    Minutes of Exercise per Session: 0 min  Stress: No Stress Concern Present (01/15/2023)   [employer] of  Occupational Health - Occupational Stress Questionnaire    Feeling of Stress : Not at all  Social Connections: Moderately Isolated (01/15/2023)   Social Connection and Isolation Panel [NHANES]    Frequency of Communication with Friends and Family: Twice a week    Frequency of Social Gatherings with Friends and Family: Never    Attends Religious Services: More than 4 times per year    Active Member of [employer] or Organizations: No    Attends Engineer, structural: Never    Marital Status: Married    Tobacco Counseling Counseling given: Not Answered   Clinical Intake:  Pre-visit preparation completed: Yes  Pain : No/denies pain     Nutritional Risks: None Diabetes: No  How often do you need to have someone help you when you read instructions, pamphlets, or other written materials from your doctor or pharmacy?: 1 - Never  Diabetic?no  Interpreter Needed?: No  Information entered by :: Kennedy Bucker, LPN   Activities of Daily Living    01/15/2023   10:46 AM  In your present state of health, do you have any difficulty performing the following activities:  Hearing? 0  Vision? 0  Difficulty concentrating or making decisions? 0  Walking or climbing stairs? 0  Dressing or bathing? 0  Doing errands, shopping? 0  Preparing Food and eating ? N  Using the Toilet? N  In the past six months, have you accidently leaked urine? N  Do you have problems with loss of bowel control? N  Managing your Medications? N  Managing your Finances? N  Housekeeping or managing your Housekeeping? N    Patient Care Team: Dorcas Carrow, DO as PCP - General (Family Medicine)  Indicate any recent Medical Services you may have received from other than [facility] providers in the past year (date may be approximate).     Assessment:   This is a routine wellness examination for Zachary Hansen.  Hearing/Vision screen Hearing Screening - Comments:: Wears aids Vision Screening - Comments:: Readers-  Dr. Larence Penning  Dietary issues and exercise activities discussed: Current Exercise Habits: The patient does not participate in regular exercise at present, Exercise limited by: None identified   Goals Addressed             This Visit's Progress    DIET - EAT MORE FRUITS AND VEGETABLES         Depression Screen    01/15/2023   10:43 AM 11/13/2022    2:04 PM 05/11/2022   11:25 AM 02/08/2022   11:28 AM 11/16/2021   10:02 AM 11/08/2021  10:39 AM 10/11/2021    9:12 AM  PHQ 2/9 Scores  PHQ - 2 Score 0 0 2 1 0 0 1  PHQ- 9 Score 0 Fall Risk    01/15/2023   10:45 AM 11/13/2022    2:04 PM 02/08/2022   11:28 AM 11/16/2021    9:55 AM 11/08/2021   10:39 AM  Fall Risk   Falls in the past year? 0 0 0 0 0  Number falls in past yr: 0 0 0 0 0  Injury with Fall? 0 0 0 0 0  Risk for fall due to : No Fall Risks No Fall Risks History of fall(s)  No Fall Risks  Follow up Falls prevention discussed;Falls evaluation completed Falls evaluation completed Falls evaluation completed Falls evaluation completed;Falls prevention discussed Falls evaluation completed    FALL RISK PREVENTION PERTAINING TO THE HOME:  Any stairs in or around the home? Yes  If so, are there any without handrails? No  Home free of loose throw rugs in walkways, pet beds, electrical cords, etc? Yes  Adequate lighting in your home to reduce risk of falls? Yes   ASSISTIVE DEVICES UTILIZED TO PREVENT FALLS:  Life alert? No  Use of a cane, walker or w/c? No  Grab bars in the bathroom? No  Shower chair or bench in shower? Yes  Elevated toilet seat or a handicapped toilet? No   TIMED UP AND GO:  Was the test performed? Yes .  Length of time to ambulate 10 feet: 4 sec.   Gait steady and fast without use of assistive device  Cognitive Function:        01/15/2023   10:49 AM 01/09/2020    2:23 PM 01/09/2020    2:20 PM 05/28/2018    1:25 PM 01/24/2017   10:47 AM  6CIT Screen  What Year? 0 points 0 points 0 points 0  points 0 points  What month? 0 points 0 points 0 points 0 points 0 points  What time? 0 points 0 points 0 points 0 points 0 points  Count back from 20 0 points 0 points 0 points 0 points 0 points  Months in reverse 0 points 0 points 0 points 0 points 2 points  Repeat phrase 0 points 0 points 0 points 0 points 0 points  Total Score 0 points 0 points 0 points 0 points 2 points    Immunizations Immunization History  Administered Date(s) Administered   Fluad Quad(high Dose 65+) 07/05/2021, 11/13/2022   Janssen (J&J) SARS-COV-2 Vaccination 04/19/2020, 01/26/2021   Pneumococcal Conjugate-13 03/17/2015, 05/28/2018   Pneumococcal Polysaccharide-23 10/24/2011, 10/02/2012   Td 05/28/2018   Tdap 10/24/2011   Zoster Recombinat (Shingrix) 11/04/2020   Zoster, Live 04/16/2012, 10/02/2012    TDAP status: Up to date  Flu Vaccine status: Up to date  Pneumococcal vaccine status: Up to date  Covid-19 vaccine status: Completed vaccines  Qualifies for Shingles Vaccine? Yes   Zostavax completed Yes   Shingrix Completed?: No.    Education has been provided regarding the importance of this vaccine. Patient has been advised to call insurance company to determine out of pocket expense if they have not yet received this vaccine. Advised may also receive vaccine at local pharmacy or Health Dept. Verbalized acceptance and understanding.  Screening Tests Health Maintenance  Topic Date Due   Zoster Vaccines- Shingrix (2 of 2) 12/30/2020   COVID-19 Vaccine (3 - 2023-24 season) 06/02/2022   INFLUENZA VACCINE  05/03/2023   Medicare Annual Wellness (AWV)  01/15/2024   DTaP/Tdap/Td (3 - Td or Tdap) 05/28/2028   Pneumonia Vaccine 40+ Years old  Completed   Hepatitis C Screening  Completed   HPV VACCINES  Aged Out   COLONOSCOPY (Pts 45-65yrs Insurance coverage will need to be confirmed)  Discontinued   Fecal DNA (Cologuard)  Discontinued    Health Maintenance  Health Maintenance Due  Topic Date Due    Zoster Vaccines- Shingrix (2 of 2) 12/30/2020   COVID-19 Vaccine (3 - 2023-24 season) 06/02/2022  Had first Shingrix   Colorectal cancer screening: No longer required.   Lung Cancer Screening: (Low Dose CT Chest recommended if Age 66-80 years, 30 pack-year currently smoking OR have quit w/in 15years.) does not qualify.   Additional Screening:  Hepatitis C Screening: does qualify; Completed 07/12/20  Vision Screening: Recommended annual ophthalmology exams for early detection of glaucoma and other disorders of the eye. Is the patient up to date with their annual eye exam?  Yes  Who is the provider or what is the name of the office in which the patient attends annual eye exams? Dr.Nice If pt is not established with a provider, would they like to be referred to a provider to establish care? No .   Dental Screening: Recommended annual dental exams for proper oral hygiene  Community Resource Referral / Chronic Care Management: CRR required this visit?  No   CCM required this visit?  No      Plan:     I have personally reviewed and noted the following in the patient's chart:   Medical and social history Use of alcohol, tobacco or illicit drugs  Current medications and supplements including opioid prescriptions. Patient is not currently taking opioid prescriptions. Functional ability and status Nutritional status Physical activity Advanced directives List of other physicians Hospitalizations, surgeries, and ER visits in previous 12 months Vitals Screenings to include cognitive, depression, and falls Referrals and appointments  In addition, I have reviewed and discussed with patient certain preventive protocols, quality metrics, and best practice recommendations. A written personalized care plan for preventive services as well as general preventive health recommendations were provided to patient.     Hal Hope, LPN   [id number]   Nurse Notes: none

## 2023-01-15 NOTE — Patient Instructions (Signed)
Zachary Hansen , Thank you for taking time to come for your Medicare Wellness Visit. I appreciate your ongoing commitment to your health goals. Please review the following plan we discussed and let me know if I can assist you in the future.   These are the goals we discussed:  Goals      DIET - EAT MORE FRUITS AND VEGETABLES     Increase physical activity        This is a list of the screening recommended for you and due dates:  Health Maintenance  Topic Date Due   Zoster (Shingles) Vaccine (2 of 2) 12/30/2020   COVID-19 Vaccine (3 - 2023-24 season) 06/02/2022   Flu Shot  05/03/2023   Medicare Annual Wellness Visit  01/15/2024   DTaP/Tdap/Td vaccine (3 - Td or Tdap) 05/28/2028   Pneumonia Vaccine  Completed   Hepatitis C Screening: USPSTF Recommendation to screen - Ages 48-79 yo.  Completed   HPV Vaccine  Aged Out   Colon Cancer Screening  Discontinued   Cologuard (Stool DNA test)  Discontinued    Advanced directives: no  Conditions/risks identified: none  Next appointment: Follow up in one year for your annual wellness visit. 01/21/24 @ 9:45 am in person  Preventive Care 65 Years and Older, Male  Preventive care refers to lifestyle choices and visits with your health care provider that can promote health and wellness. What does preventive care include? A yearly physical exam. This is also called an annual well check. Dental exams once or twice a year. Routine eye exams. Ask your health care provider how often you should have your eyes checked. Personal lifestyle choices, including: Daily care of your teeth and gums. Regular physical activity. Eating a healthy diet. Avoiding tobacco and drug use. Limiting alcohol use. Practicing safe sex. Taking low doses of aspirin every day. Taking vitamin and mineral supplements as recommended by your health care provider. What happens during an annual well check? The services and screenings done by your health care provider during your  annual well check will depend on your age, overall health, lifestyle risk factors, and family history of disease. Counseling  Your health care provider may ask you questions about your: Alcohol use. Tobacco use. Drug use. Emotional well-being. Home and relationship well-being. Sexual activity. Eating habits. History of falls. Memory and ability to understand (cognition). Work and work Astronomer. Screening  You may have the following tests or measurements: Height, weight, and BMI. Blood pressure. Lipid and cholesterol levels. These may be checked every 5 years, or more frequently if you are over 22 years old. Skin check. Lung cancer screening. You may have this screening every year starting at age 71 if you have a 30-pack-year history of smoking and currently smoke or have quit within the past 15 years. Fecal occult blood test (FOBT) of the stool. You may have this test every year starting at age 79. Flexible sigmoidoscopy or colonoscopy. You may have a sigmoidoscopy every 5 years or a colonoscopy every 10 years starting at age 70. Prostate cancer screening. Recommendations will vary depending on your family history and other risks. Hepatitis C blood test. Hepatitis B blood test. Sexually transmitted disease (STD) testing. Diabetes screening. This is done by checking your blood sugar (glucose) after you have not eaten for a while (fasting). You may have this done every 1-3 years. Abdominal aortic aneurysm (AAA) screening. You may need this if you are a current or former smoker. Osteoporosis. You may be screened starting at  age 20 if you are at high risk. Talk with your health care provider about your test results, treatment options, and if necessary, the need for more tests. Vaccines  Your health care provider may recommend certain vaccines, such as: Influenza vaccine. This is recommended every year. Tetanus, diphtheria, and acellular pertussis (Tdap, Td) vaccine. You may need a Td  booster every 10 years. Zoster vaccine. You may need this after age 64. Pneumococcal 13-valent conjugate (PCV13) vaccine. One dose is recommended after age 35. Pneumococcal polysaccharide (PPSV23) vaccine. One dose is recommended after age 10. Talk to your health care provider about which screenings and vaccines you need and how often you need them. This information is not intended to replace advice given to you by your health care provider. Make sure you discuss any questions you have with your health care provider. Document Released: 10/15/2015 Document Revised: 06/07/2016 Document Reviewed: 07/20/2015 Elsevier Interactive Patient Education  2017 [place] Prevention in the Home Falls can cause injuries. They can happen to people of all ages. There are many things you can do to make your home safe and to help prevent falls. What can I do on the outside of my home? Regularly fix the edges of walkways and driveways and fix any cracks. Remove anything that might make you trip as you walk through a door, such as a raised step or threshold. Trim any bushes or trees on the path to your home. Use bright outdoor lighting. Clear any walking paths of anything that might make someone trip, such as rocks or tools. Regularly check to see if handrails are loose or broken. Make sure that both sides of any steps have handrails. Any raised decks and porches should have guardrails on the edges. Have any leaves, snow, or ice cleared regularly. Use sand or salt on walking paths during winter. Clean up any spills in your garage right away. This includes oil or grease spills. What can I do in the bathroom? Use night lights. Install grab bars by the toilet and in the tub and shower. Do not use towel bars as grab bars. Use non-skid mats or decals in the tub or shower. If you need to sit down in the shower, use a plastic, non-slip stool. Keep the floor dry. Clean up any water that spills on the floor  as soon as it happens. Remove soap buildup in the tub or shower regularly. Attach bath mats securely with double-sided non-slip rug tape. Do not have throw rugs and other things on the floor that can make you trip. What can I do in the bedroom? Use night lights. Make sure that you have a light by your bed that is easy to reach. Do not use any sheets or blankets that are too big for your bed. They should not hang down onto the floor. Have a firm chair that has side arms. You can use this for support while you get dressed. Do not have throw rugs and other things on the floor that can make you trip. What can I do in the kitchen? Clean up any spills right away. Avoid walking on wet floors. Keep items that you use a lot in easy-to-reach places. If you need to reach something above you, use a strong step stool that has a grab bar. Keep electrical cords out of the way. Do not use floor polish or wax that makes floors slippery. If you must use wax, use non-skid floor wax. Do not have throw rugs and  other things on the floor that can make you trip. What can I do with my stairs? Do not leave any items on the stairs. Make sure that there are handrails on both sides of the stairs and use them. Fix handrails that are broken or loose. Make sure that handrails are as long as the stairways. Check any carpeting to make sure that it is firmly attached to the stairs. Fix any carpet that is loose or worn. Avoid having throw rugs at the top or bottom of the stairs. If you do have throw rugs, attach them to the floor with carpet tape. Make sure that you have a light switch at the top of the stairs and the bottom of the stairs. If you do not have them, ask someone to add them for you. What else can I do to help prevent falls? Wear shoes that: Do not have high heels. Have rubber bottoms. Are comfortable and fit you well. Are closed at the toe. Do not wear sandals. If you use a stepladder: Make sure that it is  fully opened. Do not climb a closed stepladder. Make sure that both sides of the stepladder are locked into place. Ask someone to hold it for you, if possible. Clearly mark and make sure that you can see: Any grab bars or handrails. First and last steps. Where the edge of each step is. Use tools that help you move around (mobility aids) if they are needed. These include: Canes. Walkers. Scooters. Crutches. Turn on the lights when you go into a dark area. Replace any light bulbs as soon as they burn out. Set up your furniture so you have a clear path. Avoid moving your furniture around. If any of your floors are uneven, fix them. If there are any pets around you, be aware of where they are. Review your medicines with your doctor. Some medicines can make you feel dizzy. This can increase your chance of falling. Ask your doctor what other things that you can do to help prevent falls. This information is not intended to replace advice given to you by your health care provider. Make sure you discuss any questions you have with your health care provider. Document Released: 07/15/2009 Document Revised: 02/24/2016 Document Reviewed: 10/23/2014 Elsevier Interactive Patient Education  2017 [employer].

## 2023-04-09 DIAGNOSIS — H2511 Age-related nuclear cataract, right eye: Secondary | ICD-10-CM | POA: Diagnosis not present

## 2023-04-09 DIAGNOSIS — H25011 Cortical age-related cataract, right eye: Secondary | ICD-10-CM | POA: Diagnosis not present

## 2023-04-09 DIAGNOSIS — H52201 Unspecified astigmatism, right eye: Secondary | ICD-10-CM | POA: Diagnosis not present

## 2023-04-10 DIAGNOSIS — H2512 Age-related nuclear cataract, left eye: Secondary | ICD-10-CM | POA: Diagnosis not present

## 2023-04-23 DIAGNOSIS — H2512 Age-related nuclear cataract, left eye: Secondary | ICD-10-CM | POA: Diagnosis not present

## 2023-04-23 DIAGNOSIS — H25012 Cortical age-related cataract, left eye: Secondary | ICD-10-CM | POA: Diagnosis not present

## 2023-05-29 ENCOUNTER — Ambulatory Visit: Payer: Medicare Other | Admitting: Family Medicine

## 2023-05-31 ENCOUNTER — Encounter: Payer: Self-pay | Admitting: Physician Assistant

## 2023-05-31 ENCOUNTER — Ambulatory Visit: Payer: Medicare Other | Admitting: Physician Assistant

## 2023-05-31 VITALS — BP 135/76 | HR 60 | Temp 97.9°F | Wt 178.2 lb

## 2023-05-31 DIAGNOSIS — U071 COVID-19: Secondary | ICD-10-CM | POA: Diagnosis not present

## 2023-05-31 NOTE — Progress Notes (Signed)
Acute Office Visit   Patient: Zachary Hansen   DOB: [birth year]   82 y.o. Male  MRN: [id number] Visit Date: 05/31/2023  Today's healthcare provider: Oswaldo Conroy Kionte Baumgardner, PA-C  Introduced myself to the patient as a Secondary school teacher and provided education on APPs in clinical practice.    Chief Complaint  Patient presents with   Dizziness   Cough   Sore Throat   Fever    Symptoms started last Thursday   Subjective    HPI HPI     Fever    Additional comments: Symptoms started last Thursday      Last edited by Andre Lefort, CMA on 05/31/2023  3:12 PM.        URI - symptoms  Onset: sudden with gradual progression  Duration: ongoing since last Thursday  Associated Symptoms: chills, headaches, fever, dizziness, ankle swelling and pain, dry coughing, left ear pain  States his symptoms have been improving over the past few days but dizziness is still persisting  Interventions: nothing, maybe Tylenol   Recent sick contacts:none to his knowledge  COVID testing at home: he tested for COVID this AM and it was positive    Medications: Outpatient Medications Prior to Visit  Medication Sig   amLODipine (NORVASC) 5 MG tablet Take 1 tablet (5 mg total) by mouth daily.   Cyanocobalamin (VITAMIN B 12 PO) Take 1,000 mcg by mouth daily.   cyclobenzaprine (FLEXERIL) 10 MG tablet TAKE 1 TABLET BY MOUTH EVERYDAY AT BEDTIME   FLUoxetine (PROZAC) 40 MG capsule Take 1 capsule (40 mg total) by mouth daily.   Misc Natural Products (OSTEO BI-FLEX JOINT SHIELD PO) Take by mouth.   Multiple Vitamin (MULTI-VITAMINS) TABS Take by mouth.   naproxen (NAPROSYN) 500 MG tablet Take 1 tablet (500 mg total) by mouth 2 (two) times daily as needed.   valsartan (DIOVAN) 40 MG tablet Take 2 tablets (80 mg total) by mouth daily.   VITAMIN E PO Take by mouth daily.   No facility-administered medications prior to visit.    Review of Systems  Constitutional:  Positive for chills, fatigue and fever.  HENT:   Positive for congestion, ear pain, rhinorrhea and sore throat. Negative for postnasal drip, sinus pressure and sinus pain.   Respiratory:  Positive for cough. Negative for shortness of breath.   Cardiovascular:  Negative for chest pain and palpitations.  Gastrointestinal:  Positive for diarrhea (resolving). Negative for nausea and vomiting.  Musculoskeletal:  Positive for myalgias.  Neurological:  Positive for dizziness and headaches.        Objective    BP 135/76   Pulse 60   Temp 97.9 F (36.6 C) (Oral)   Wt 178 lb 3.2 oz (80.8 kg)   SpO2 95%   BMI 27.10 kg/m     Physical Exam Vitals reviewed.  Constitutional:      General: He is awake.     Appearance: Normal appearance. He is well-developed and well-groomed.  HENT:     Head: Normocephalic and atraumatic.  Cardiovascular:     Rate and Rhythm: Normal rate and regular rhythm.     Pulses: Normal pulses.          Radial pulses are 2+ on the right side and 2+ on the left side.     Heart sounds: Normal heart sounds. No murmur heard.    No friction rub. No gallop.  Pulmonary:     Effort: Pulmonary effort is normal.  Breath sounds: Normal breath sounds. No decreased air movement. No decreased breath sounds, wheezing, rhonchi or rales.  Musculoskeletal:     Cervical back: Normal range of motion and neck supple.     Right lower leg: No edema.     Left lower leg: No edema.  Neurological:     Mental Status: He is alert.  Psychiatric:        Behavior: Behavior is cooperative.       No results found for any visits on 05/31/23.  Assessment & Plan      No follow-ups on file.     Problem List Items Addressed This Visit   None Visit Diagnoses     COVID-19    -  Primary Acute, new concern Patient reports that he has been feeling sick since last Thursday with symptoms comprised of chills, fatigue, fever, congestion, ear pain, sore throat, cough, diarrhea, body aches He reports that symptoms have gradually been  improving and he has tried taking Tylenol at home to help with symptoms Patient reports that he did test for COVID this morning and the result was positive.  At this time patient is outside of the recommended window for antiviral treatment.  We discussed this today and I recommend proceeding with symptomatic management at this time Given the patient has a history of high blood pressure I recommend using Robitussin, Mucinex, Tylenol, daily antihistamine such as Claritin or Zyrtec along with Flonase to assist with symptoms. We discussed ED and return precautions Follow-up as needed for progressing or persistent symptoms        No follow-ups on file.   I, Chia Rock E Cattaleya Wien, PA-C, have reviewed all documentation for this visit. The documentation on 06/06/23 for the exam, diagnosis, procedures, and orders are all accurate and complete.   Jacquelin Hawking, MHS, PA-C [facility]

## 2023-05-31 NOTE — Patient Instructions (Addendum)

## 2023-07-28 ENCOUNTER — Other Ambulatory Visit: Payer: Self-pay | Admitting: Family Medicine

## 2023-07-30 NOTE — Telephone Encounter (Signed)
30 day courtesy refill- called pt and LM on Vm to make appt  Requested Prescriptions  Pending Prescriptions Disp Refills   FLUoxetine (PROZAC) 40 MG capsule [Pharmacy Med Name: FLUOXETINE HCL 40 MG CAPSULE] 30 capsule 0    Sig: TAKE 1 CAPSULE (40 MG TOTAL) BY MOUTH DAILY.     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   [facility] [place], Megan P, DO   1 year ago Routine general medical examination at a health care facility   [facility] [place], [place] P, DO   1 year ago Chronic cough   [place] [facility] Vigg, Avanti, MD   1 year ago Flu-like symptoms   [place] [facility] [place], [place], DO              Passed - Completed PHQ-2 or PHQ-9 in the last 360 days       amLODipine (NORVASC) 5 MG tablet [Pharmacy Med Name: AMLODIPINE BESYLATE 5 MG TAB] 30 tablet 0    Sig: TAKE 1 TABLET (5 MG TOTAL) BY MOUTH DAILY.     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   [place] [facility] [place], [place] P, DO   1 year ago Routine general medical examination at a health care facility   [facility] [place], [place] P, DO   1 year ago Chronic cough   [place] [facility] Vigg, Avanti, MD   1 year ago Flu-like symptoms   [place] [facility] [place], Megan P, DO              Passed - Last BP in normal range    BP Readings from Last 1  Encounters:  05/31/23 135/76         Passed - Last Heart Rate in normal range    Pulse Readings from Last 1 Encounters:  05/31/23 60

## 2023-08-02 ENCOUNTER — Other Ambulatory Visit: Payer: Self-pay | Admitting: Family Medicine

## 2023-08-02 NOTE — Telephone Encounter (Signed)
Medication Refill - Medication: valsartan (DIOVAN) 40 MG tablet  Pt states that he is completely out of medication.   Has the patient contacted their pharmacy? Yes.     Preferred Pharmacy (with phone number or street name): [facility] [phone] [place], [place] - [address]  Phone: [phone] Fax: [phone]  Has the patient been seen for an appointment in the last year OR does the patient have an upcoming appointment? Yes.    Agent: Please be advised that RX refills may take up to 3 business days. We ask that you follow-up with your pharmacy.

## 2023-08-03 MED ORDER — VALSARTAN 40 MG PO TABS: 80.0000 mg | ORAL_TABLET | Freq: Every day | ORAL | 0 refills | Status: DC

## 2023-08-23 ENCOUNTER — Other Ambulatory Visit: Payer: Self-pay | Admitting: Family Medicine

## 2023-08-24 NOTE — Telephone Encounter (Signed)
Requested medications are due for refill today.  yes  Requested medications are on the active medications list.  yes  Last refill. 07/30/2023 #30 0 rf for both  Future visit scheduled.   no  Notes to clinic.  Pt already given a courtesy refill.    Requested Prescriptions  Pending Prescriptions Disp Refills   FLUoxetine (PROZAC) 40 MG capsule [Pharmacy Med Name: FLUOXETINE HCL 40 MG CAPSULE] 90 capsule 1    Sig: TAKE 1 CAPSULE (40 MG TOTAL) BY MOUTH DAILY.     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

## 2023-08-25 ENCOUNTER — Other Ambulatory Visit: Payer: Self-pay | Admitting: Family Medicine

## 2023-08-26 ENCOUNTER — Other Ambulatory Visit: Payer: Self-pay | Admitting: Family Medicine

## 2023-08-27 NOTE — Telephone Encounter (Signed)
Overdue for physical. Please get scheduled and I'll send in refills

## 2023-08-27 NOTE — Telephone Encounter (Signed)
Requested medications are due for refill today.  yes  Requested medications are on the active medications list.  yes  Last refill. 1/0/282024 #30 0 rf  Future visit scheduled.   no  Notes to clinic.  Pt already given a courtesy refill.     Requested Prescriptions  Pending Prescriptions Disp Refills   FLUoxetine (PROZAC) 40 MG capsule [Pharmacy Med Name: FLUOXETINE HCL 40 MG CAPSULE] 30 capsule 0    Sig: TAKE 1 CAPSULE (40 MG TOTAL) BY MOUTH DAILY.     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

## 2023-08-27 NOTE — Telephone Encounter (Signed)
Called pt to make appt - pt was due in August. Left message on machine.

## 2023-08-28 NOTE — Telephone Encounter (Signed)
Requested medications are due for refill today.  yes  Requested medications are on the active medications list.  yes  Last refill. 07/30/2023 #30 0 rf  Future visit scheduled.   no  Notes to clinic.  Pt already given a courtesy refill.   Requested Prescriptions  Pending Prescriptions Disp Refills   amLODipine (NORVASC) 5 MG tablet [Pharmacy Med Name: AMLODIPINE BESYLATE 5 MG TAB] 30 tablet 0    Sig: TAKE 1 TABLET (5 MG TOTAL) BY MOUTH DAILY.     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   [place] [facility] [place], Megan P, DO   1 year ago Routine general medical examination at a health care facility   [facility] [place], [place] P, DO   1 year ago Chronic cough   [place] [facility] Vigg, Avanti, MD   1 year ago Flu-like symptoms   [place] [facility] [place], Megan P, DO              Passed - Last BP in normal range    BP Readings from Last 1 Encounters:  05/31/23 135/76         Passed - Last Heart Rate in normal range    Pulse Readings from Last 1 Encounters:  05/31/23 60

## 2023-08-28 NOTE — Telephone Encounter (Signed)
Called patient and scheduled physical on 09/11/2023 @ 4:20 pm.

## 2023-08-28 NOTE — Telephone Encounter (Signed)
Needs refill before upcoming appointment.

## 2023-09-11 ENCOUNTER — Encounter: Payer: Self-pay | Admitting: Family Medicine

## 2023-09-11 ENCOUNTER — Ambulatory Visit (INDEPENDENT_AMBULATORY_CARE_PROVIDER_SITE_OTHER): Payer: Medicare Other | Admitting: Family Medicine

## 2023-09-11 VITALS — BP 119/81 | HR 94 | Ht 68.25 in | Wt 180.2 lb

## 2023-09-11 DIAGNOSIS — F339 Major depressive disorder, recurrent, unspecified: Secondary | ICD-10-CM | POA: Diagnosis not present

## 2023-09-11 DIAGNOSIS — M25552 Pain in left hip: Secondary | ICD-10-CM

## 2023-09-11 DIAGNOSIS — I129 Hypertensive chronic kidney disease with stage 1 through stage 4 chronic kidney disease, or unspecified chronic kidney disease: Secondary | ICD-10-CM | POA: Diagnosis not present

## 2023-09-11 DIAGNOSIS — Z23 Encounter for immunization: Secondary | ICD-10-CM | POA: Diagnosis not present

## 2023-09-11 DIAGNOSIS — E782 Mixed hyperlipidemia: Secondary | ICD-10-CM

## 2023-09-11 DIAGNOSIS — Z0000 Encounter for general adult medical examination without abnormal findings: Secondary | ICD-10-CM

## 2023-09-11 LAB — MICROALBUMIN, URINE WAIVED
Creatinine, Urine Waived: 300 mg/dL (ref 10–300)
Microalb, Ur Waived: 80 mg/L — ABNORMAL HIGH (ref 0–19)

## 2023-09-11 MED ORDER — FLUOXETINE HCL 40 MG PO CAPS: 40.0000 mg | ORAL_CAPSULE | Freq: Every day | ORAL | 1 refills | Status: DC

## 2023-09-11 MED ORDER — AMLODIPINE BESYLATE 5 MG PO TABS: 5.0000 mg | ORAL_TABLET | Freq: Every day | ORAL | 1 refills | Status: DC

## 2023-09-11 MED ORDER — VALSARTAN 40 MG PO TABS: 80.0000 mg | ORAL_TABLET | Freq: Every day | ORAL | 1 refills | Status: DC

## 2023-09-11 MED ORDER — MELOXICAM 15 MG PO TABS: 15.0000 mg | ORAL_TABLET | Freq: Every day | ORAL | 3 refills | Status: DC

## 2023-09-11 NOTE — Progress Notes (Signed)
BP 119/81   Pulse 94   Ht 5' 8.25" (1.734 m)   Wt 180 lb 3.2 oz (81.7 kg)   SpO2 97%   BMI 27.20 kg/m    Subjective:    Patient ID: Zachary Hansen, male    DOB: [birth year], 82 y.o.   MRN: [id number]  HPI: Zachary Hansen is a 82 y.o. male presenting on 09/11/2023 for comprehensive medical examination. Current medical complaints include:  HIP PAIN Duration: few weeks Involved hip: left  Mechanism of injury: unknown Location: posterior Onset: gradual  Severity: severe  Quality: aching Frequency: intermittent Radiation: into his hip bone and into his leg Aggravating factors: moving   Alleviating factors: heat  Status: worse Treatments attempted: rest, heat, and aleve   Relief with NSAIDs?: mild Weakness with weight bearing: no Weakness with walking: no Paresthesias / decreased sensation: no Swelling: no Redness:no Fevers: no  Has had a cold for the past few days, but has been feeling better.   HYPERTENSION / HYPERLIPIDEMIA Satisfied with current treatment? no Duration of hypertension: chronic BP monitoring frequency: not checking BP medication side effects: no Past BP meds: amlodipine, valsartan Duration of hyperlipidemia: chronic Cholesterol medication side effects: no Cholesterol supplements: none Past cholesterol medications: none Medication compliance: excellent compliance Aspirin: no Recent stressors: no Recurrent headaches: no Visual changes: no Palpitations: no Dyspnea: no Chest pain: no Lower extremity edema: no Dizzy/lightheaded: no  DEPRESSION Mood status: controlled Satisfied with current treatment?: yes Symptom severity: mild  Duration of current treatment : chronic Side effects: no Medication compliance: excellent compliance Psychotherapy/counseling: no  Previous psychiatric medications: fluoxetine Depressed mood: no Anxious mood: no Anhedonia: no Significant weight loss or gain: no Insomnia: no  Fatigue: yes Feelings of  worthlessness or guilt: no Impaired concentration/indecisiveness: no Suicidal ideations: no Hopelessness: no Crying spells: no    09/11/2023    4:12 PM 05/31/2023    3:15 PM 01/15/2023   10:43 AM 11/13/2022    2:04 PM 05/11/2022   11:25 AM  Depression screen PHQ 2/9  Decreased Interest 0 0 0 0 1  Down, Depressed, Hopeless 0 0 0 0 1  PHQ - 2 Score 0 0 0 0 2  Altered sleeping 2 2 0 2 0  Tired, decreased energy 3 3 0 2 3  Change in appetite 0 1 0 0 0  Feeling bad or failure about yourself  0 0 0 0 0  Trouble concentrating 0 0 0 0 0  Moving slowly or fidgety/restless 0 0 0 0 0  Suicidal thoughts 0 0 0 0 0  PHQ-9 Score 5 6 0 4 5  Difficult doing work/chores Not difficult at all Not difficult at all Not difficult at all  Not difficult at all    He currently lives with: wife Interim Problems from his last visit: no  Depression Screen done today and results listed below:     09/11/2023    4:12 PM 05/31/2023    3:15 PM 01/15/2023   10:43 AM 11/13/2022    2:04 PM 05/11/2022   11:25 AM  Depression screen PHQ 2/9  Decreased Interest 0 0 0 0 1  Down, Depressed, Hopeless 0 0 0 0 1  PHQ - 2 Score 0 0 0 0 2  Altered sleeping 2 2 0 2 0  Tired, decreased energy 3 3 0 2 3  Change in appetite 0 1 0 0 0  Feeling bad or failure about yourself  0 0 0 0 0  Trouble  concentrating 0 0 0 0 0  Moving slowly or fidgety/restless 0 0 0 0 0  Suicidal thoughts 0 0 0 0 0  PHQ-9 Score 5 6 0 4 5  Difficult doing work/chores Not difficult at all Not difficult at all Not difficult at all  Not difficult at all    Past Medical History:  Past Medical History:  Diagnosis Date   ADHD (attention deficit hyperactivity disorder)    Depression    Prostate cancer (HCC)    Sleep apnea     Surgical History:  Past Surgical History:  Procedure Laterality Date   NASAL SEPTUM SURGERY     prostate cancer surgery     THROAT SURGERY     Due to snoring    Medications:  Current Outpatient Medications on File  Prior to Visit  Medication Sig   Cyanocobalamin (VITAMIN B 12 PO) Take 1,000 mcg by mouth daily.   cyclobenzaprine (FLEXERIL) 10 MG tablet TAKE 1 TABLET BY MOUTH EVERYDAY AT BEDTIME   Misc Natural Products (OSTEO BI-FLEX JOINT SHIELD PO) Take by mouth.   Multiple Vitamin (MULTI-VITAMINS) TABS Take by mouth.   VITAMIN E PO Take by mouth daily.   No current facility-administered medications on file prior to visit.    Allergies:  Allergies  Allergen Reactions   Lisinopril Cough    Social History:  Social History   Socioeconomic History   Marital status: Married    Spouse name: Not on file   Number of children: Not on file   Years of education: Not on file   Highest education level: Not on file  Occupational History   Not on file  Tobacco Use   Smoking status: Former    Current packs/day: 0.00    Types: Cigarettes    Quit date: 10/02/1968    Years since quitting: 54.9   Smokeless tobacco: Never  Vaping Use   Vaping status: Never Used  Substance and Sexual Activity   Alcohol use: Yes    Comment: 1 drink a day    Drug use: No   Sexual activity: Never  Other Topics Concern   Not on file  Social History Narrative   Not on file   Social Determinants of Health   Financial Resource Strain: Low Risk  (01/15/2023)   Overall Financial Resource Strain (CARDIA)    Difficulty of Paying Living Expenses: Not hard at all  Food Insecurity: No Food Insecurity (01/15/2023)   Hunger Vital Sign    Worried About Running Out of Food in the Last Year: Never true    Ran Out of Food in the Last Year: Never true  Transportation Needs: No Transportation Needs (01/15/2023)   PRAPARE - Administrator, Civil Service (Medical): No    Lack of Transportation (Non-Medical): No  Physical Activity: Inactive (01/15/2023)   Exercise Vital Sign    Days of Exercise per Week: 0 days    Minutes of Exercise per Session: 0 min  Stress: No Stress Concern Present (01/15/2023)   [employer] of  Occupational Health - Occupational Stress Questionnaire    Feeling of Stress : Not at all  Social Connections: Moderately Isolated (01/15/2023)   Social Connection and Isolation Panel [NHANES]    Frequency of Communication with Friends and Family: Twice a week    Frequency of Social Gatherings with Friends and Family: Never    Attends Religious Services: More than 4 times per year    Active Member of Clubs or Organizations: No  Attends Banker Meetings: Never    Marital Status: Married  Catering manager Violence: Not At Risk (01/15/2023)   Humiliation, Afraid, Rape, and Kick questionnaire    Fear of Current or Ex-Partner: No    Emotionally Abused: No    Physically Abused: No    Sexually Abused: No   Social History   Tobacco Use  Smoking Status Former   Current packs/day: 0.00   Types: Cigarettes   Quit date: 10/02/1968   Years since quitting: 54.9  Smokeless Tobacco Never   Social History   Substance and Sexual Activity  Alcohol Use Yes   Comment: 1 drink a day     Family History:  Family History  Problem Relation Age of Onset   Arthritis Mother    Heart disease Father    Cancer Father    Heart disease Sister    Cancer Sister    Sleep apnea Brother    Alzheimer's disease Maternal Grandfather    Heart disease Paternal Grandmother    Diverticulitis Sister    Sleep apnea Sister     Past medical history, surgical history, medications, allergies, family history and social history reviewed with patient today and changes made to appropriate areas of the chart.   Review of Systems  Constitutional: Negative.   HENT: Negative.    Eyes:  Positive for blurred vision (since cataract surgery). Negative for double vision, photophobia, pain, discharge and redness.  Respiratory:  Positive for cough and shortness of breath. Negative for hemoptysis, sputum production and wheezing.   Cardiovascular: Negative.   Gastrointestinal:  Positive for heartburn. Negative for  abdominal pain, blood in stool, constipation, diarrhea, melena, nausea and vomiting.  Genitourinary: Negative.   Musculoskeletal:  Positive for joint pain and myalgias. Negative for back pain, falls and neck pain.  Skin: Negative.   Neurological:  Positive for dizziness. Negative for tingling, tremors, sensory change, speech change, focal weakness, seizures, loss of consciousness, weakness and headaches.  Endo/Heme/Allergies: Negative.   Psychiatric/Behavioral: Negative.     All other ROS negative except what is listed above and in the HPI.      Objective:    BP 119/81   Pulse 94   Ht 5' 8.25" (1.734 m)   Wt 180 lb 3.2 oz (81.7 kg)   SpO2 97%   BMI 27.20 kg/m   Wt Readings from Last 3 Encounters:  09/11/23 180 lb 3.2 oz (81.7 kg)  05/31/23 178 lb 3.2 oz (80.8 kg)  01/15/23 183 lb 9.6 oz (83.3 kg)    Physical Exam Vitals and nursing note reviewed.  Constitutional:      General: He is not in acute distress.    Appearance: Normal appearance. He is obese. He is not ill-appearing, toxic-appearing or diaphoretic.  HENT:     Head: Normocephalic and atraumatic.     Right Ear: Tympanic membrane, ear canal and external ear normal. There is no impacted cerumen.     Left Ear: Tympanic membrane, ear canal and external ear normal. There is no impacted cerumen.     Nose: Nose normal. No congestion or rhinorrhea.     Mouth/Throat:     Mouth: Mucous membranes are moist.     Pharynx: Oropharynx is clear. No oropharyngeal exudate or posterior oropharyngeal erythema.  Eyes:     General: No scleral icterus.       Right eye: No discharge.        Left eye: No discharge.     Extraocular Movements: Extraocular movements intact.  Conjunctiva/sclera: Conjunctivae normal.     Pupils: Pupils are equal, round, and reactive to light.  Neck:     Vascular: No carotid bruit.  Cardiovascular:     Rate and Rhythm: Normal rate and regular rhythm.     Pulses: Normal pulses.     Heart sounds: Murmur  heard.     No friction rub. No gallop.  Pulmonary:     Effort: Pulmonary effort is normal. No respiratory distress.     Breath sounds: Normal breath sounds. No stridor. No wheezing, rhonchi or rales.  Chest:     Chest wall: No tenderness.  Abdominal:     General: Abdomen is flat. Bowel sounds are normal. There is no distension.     Palpations: Abdomen is soft. There is no mass.     Tenderness: There is no abdominal tenderness. There is no right CVA tenderness, left CVA tenderness, guarding or rebound.     Hernia: No hernia is present.  Genitourinary:    Comments: Genital exam deferred with shared decision making Musculoskeletal:        General: No swelling, tenderness, deformity or signs of injury.     Cervical back: Normal range of motion and neck supple. No rigidity. No muscular tenderness.     Right lower leg: No edema.     Left lower leg: No edema.  Lymphadenopathy:     Cervical: No cervical adenopathy.  Skin:    General: Skin is warm and dry.     Capillary Refill: Capillary refill takes less than 2 seconds.     Coloration: Skin is not jaundiced or pale.     Findings: No bruising, erythema, lesion or rash.  Neurological:     General: No focal deficit present.     Mental Status: He is alert and oriented to person, place, and time.     Cranial Nerves: No cranial nerve deficit.     Sensory: No sensory deficit.     Motor: No weakness.     Coordination: Coordination normal.     Gait: Gait normal.     Deep Tendon Reflexes: Reflexes normal.  Psychiatric:        Mood and Affect: Mood normal.        Behavior: Behavior normal.        Thought Content: Thought content normal.        Judgment: Judgment normal.     Results for orders placed or performed in visit on 09/11/23  Microalbumin, Urine Waived  Result Value Ref Range   Microalb, Ur Waived 80 (H) 0 - 19 mg/L   Creatinine, Urine Waived 300 10 - 300 mg/dL   Microalb/Creat Ratio 30-300 (H) <30 mg/g      Assessment & Plan:    Problem List Items Addressed This Visit       Genitourinary   Benign hypertensive renal disease    Under good control on current regimen. Continue current regimen. Continue to monitor. Call with any concerns. Refills given. Labs drawn today.        Relevant Orders   Comprehensive metabolic panel   CBC with Differential/Platelet   TSH   Microalbumin, Urine Waived (Completed)     Other   Depression, recurrent (HCC)    Under good control on current regimen. Continue current regimen. Continue to monitor. Call with any concerns. Refills given. Labs drawn today.        Relevant Medications   FLUoxetine (PROZAC) 40 MG capsule   Hyperlipidemia    Rechecking  labs today. Await results. Treat as needed.       Relevant Medications   amLODipine (NORVASC) 5 MG tablet   valsartan (DIOVAN) 40 MG tablet   Other Relevant Orders   Comprehensive metabolic panel   CBC with Differential/Platelet   Lipid Panel w/o Chol/HDL Ratio   Other Visit Diagnoses     Routine general medical examination at a health care facility    -  Primary   Vaccines up to date. Screening labs checked today. Continue diet and exercise. Call with any concerns.   Left hip pain       Will check x-rays and treat with meloxicam and stretches. Await results. Check back in in 6 weeks.   Relevant Orders   DG Hip Unilat W OR W/O Pelvis 2-3 Views Left   DG Lumbar Spine Complete   Needs flu shot       Flu shot given today.   Relevant Orders   Flu Vaccine Trivalent High Dose (Fluad) (Completed)       LABORATORY TESTING:  Health maintenance labs ordered today as discussed above.   IMMUNIZATIONS:   - Tdap: Tetanus vaccination status reviewed: last tetanus booster within 10 years. - Influenza: Administered today - Pneumovax: Up to date - Prevnar: Up to date - COVID: Refused - HPV: Not applicable - Shingrix vaccine: Up to date  SCREENING: - Colonoscopy: Not applicable  Discussed with patient purpose of the  colonoscopy is to detect colon cancer at curable precancerous or early stages   PATIENT COUNSELING:    Sexuality: Discussed sexually transmitted diseases, partner selection, use of condoms, avoidance of unintended pregnancy  and contraceptive alternatives.   Advised to avoid cigarette smoking.  I discussed with the patient that most people either abstain from alcohol or drink within safe limits (<=14/week and <=4 drinks/occasion for males, <=7/weeks and <= 3 drinks/occasion for females) and that the risk for alcohol disorders and other health effects rises proportionally with the number of drinks per week and how often a drinker exceeds daily limits.  Discussed cessation/primary prevention of drug use and availability of treatment for abuse.   Diet: Encouraged to adjust caloric intake to maintain  or achieve ideal body weight, to reduce intake of dietary saturated fat and total fat, to limit sodium intake by avoiding high sodium foods and not adding table salt, and to maintain adequate dietary potassium and calcium preferably from fresh fruits, vegetables, and low-fat dairy products.    stressed the importance of regular exercise  Injury prevention: Discussed safety belts, safety helmets, smoke detector, smoking near bedding or upholstery.   Dental health: Discussed importance of regular tooth brushing, flossing, and dental visits.   Follow up plan: NEXT PREVENTATIVE PHYSICAL DUE IN 1 YEAR. Return in about 6 weeks (around 10/23/2023).

## 2023-09-11 NOTE — Assessment & Plan Note (Signed)
Under good control on current regimen. Continue current regimen. Continue to monitor. Call with any concerns. Refills given. Labs drawn today.   

## 2023-09-11 NOTE — Assessment & Plan Note (Addendum)
Rechecking labs today. Await results. Treat as needed.  °

## 2023-09-12 ENCOUNTER — Encounter: Payer: Self-pay | Admitting: Family Medicine

## 2023-09-12 LAB — TSH: TSH: 1.65 u[IU]/mL (ref 0.450–4.500)

## 2023-09-12 LAB — LIPID PANEL W/O CHOL/HDL RATIO
Cholesterol, Total: 229 mg/dL — ABNORMAL HIGH (ref 100–199)
HDL: 43 mg/dL (ref >39)
LDL Chol Calc (NIH): 125 mg/dL — ABNORMAL HIGH (ref 0–99)
Triglycerides: 344 mg/dL — ABNORMAL HIGH (ref 0–149)
VLDL Cholesterol Cal: 61 mg/dL — ABNORMAL HIGH (ref 5–40)

## 2023-09-12 LAB — CBC WITH DIFFERENTIAL/PLATELET
Basophils Absolute: 0.1 10*3/uL (ref 0.0–0.2)
Basos: 1 %
EOS (ABSOLUTE): 0.1 10*3/uL (ref 0.0–0.4)
Eos: 2 %
Hematocrit: 46.2 % (ref 37.5–51.0)
Hemoglobin: 15.7 g/dL (ref 13.0–17.7)
Immature Grans (Abs): 0 10*3/uL (ref 0.0–0.1)
Immature Granulocytes: 1 %
Lymphocytes Absolute: 2.2 10*3/uL (ref 0.7–3.1)
Lymphs: 36 %
MCH: 32.4 pg (ref 26.6–33.0)
MCHC: 34 g/dL (ref 31.5–35.7)
MCV: 95 fL (ref 79–97)
Monocytes Absolute: 0.4 10*3/uL (ref 0.1–0.9)
Monocytes: 7 %
Neutrophils Absolute: 3.4 10*3/uL (ref 1.4–7.0)
Neutrophils: 53 %
Platelets: 277 10*3/uL (ref 150–450)
RBC: 4.85 x10E6/uL (ref 4.14–5.80)
RDW: 13.1 % (ref 11.6–15.4)
WBC: 6.3 10*3/uL (ref 3.4–10.8)

## 2023-09-12 LAB — COMPREHENSIVE METABOLIC PANEL
ALT: 20 [IU]/L (ref 0–44)
AST: 20 [IU]/L (ref 0–40)
Albumin: 4.9 g/dL — ABNORMAL HIGH (ref 3.8–4.8)
Alkaline Phosphatase: 117 [IU]/L (ref 44–121)
BUN/Creatinine Ratio: 22 (ref 10–24)
BUN: 26 mg/dL (ref 8–27)
Bilirubin Total: 0.6 mg/dL (ref 0.0–1.2)
CO2: 20 mmol/L (ref 20–29)
Calcium: 9.7 mg/dL (ref 8.6–10.2)
Chloride: 102 mmol/L (ref 96–106)
Creatinine, Ser: 1.19 mg/dL (ref 0.76–1.27)
Globulin, Total: 2.7 g/dL (ref 1.5–4.5)
Glucose: 174 mg/dL — ABNORMAL HIGH (ref 70–99)
Potassium: 4.1 mmol/L (ref 3.5–5.2)
Sodium: 140 mmol/L (ref 134–144)
Total Protein: 7.6 g/dL (ref 6.0–8.5)
eGFR: 62 mL/min/{1.73_m2} (ref >59)

## 2023-09-12 NOTE — Progress Notes (Signed)
Printed and mailed out on 09/12/2023.

## 2023-09-13 ENCOUNTER — Ambulatory Visit
Admission: RE | Admit: 2023-09-13 | Discharge: 2023-09-13 | Disposition: A | Discharge status: Home / Self Care | Payer: Medicare Other | Source: Ambulatory Visit | Attending: Family Medicine | Admitting: Family Medicine

## 2023-09-13 ENCOUNTER — Ambulatory Visit
Admission: RE | Admit: 2023-09-13 | Discharge: 2023-09-13 | Disposition: A | Discharge status: Home / Self Care | Payer: Medicare Other | Attending: Family Medicine | Admitting: Family Medicine

## 2023-09-13 DIAGNOSIS — M25552 Pain in left hip: Secondary | ICD-10-CM | POA: Diagnosis not present

## 2023-09-13 DIAGNOSIS — M47816 Spondylosis without myelopathy or radiculopathy, lumbar region: Secondary | ICD-10-CM | POA: Diagnosis not present

## 2023-09-13 DIAGNOSIS — I878 Other specified disorders of veins: Secondary | ICD-10-CM | POA: Diagnosis not present

## 2023-09-13 DIAGNOSIS — M545 Low back pain, unspecified: Secondary | ICD-10-CM | POA: Diagnosis not present

## 2023-10-23 ENCOUNTER — Ambulatory Visit (INDEPENDENT_AMBULATORY_CARE_PROVIDER_SITE_OTHER): Payer: Medicare Other | Admitting: Family Medicine

## 2023-10-23 ENCOUNTER — Encounter: Payer: Self-pay | Admitting: Family Medicine

## 2023-10-23 VITALS — BP 133/77 | HR 51 | Temp 98.0°F | Wt 180.8 lb

## 2023-10-23 DIAGNOSIS — M4726 Other spondylosis with radiculopathy, lumbar region: Secondary | ICD-10-CM | POA: Insufficient documentation

## 2023-10-23 MED ORDER — MELOXICAM 15 MG PO TABS: 15.0000 mg | ORAL_TABLET | Freq: Every day | ORAL | 1 refills | Status: DC

## 2023-10-23 NOTE — Progress Notes (Signed)
BP 133/77   Pulse (!) 51   Temp 98 F (36.7 C) (Oral)   Wt 180 lb 12.8 oz (82 kg)   SpO2 97%   BMI 27.29 kg/m    Subjective:    Patient ID: Zachary Hansen, male    DOB: 1941/04/10, 83 y.o.   MRN: [id number]  HPI: Zachary Hansen is a 83 y.o. male  Chief Complaint  Patient presents with   Hip Pain   BACK PAIN Duration: months Mechanism of injury: unknown Location: L low back Onset: gradual Severity: mild Quality: aching Frequency: intermittent Radiation: none Aggravating factors: moving Alleviating factors: heat, meloxicam Status: better Treatments attempted:  meloxicam, rest, ice, heat, and APAP  Relief with NSAIDs?: significant Nighttime pain:  no Paresthesias / decreased sensation:  no Bowel / bladder incontinence:  no Fevers:  no Dysuria / urinary frequency:  no  Relevant past medical, surgical, family and social history reviewed and updated as indicated. Interim medical history since our last visit reviewed. Allergies and medications reviewed and updated.  Review of Systems  Constitutional: Negative.   Respiratory: Negative.    Cardiovascular: Negative.   Gastrointestinal: Negative.   Musculoskeletal:  Positive for back pain and myalgias. Negative for arthralgias, gait problem, joint swelling, neck pain and neck stiffness.  Skin: Negative.   Neurological: Negative.   Psychiatric/Behavioral: Negative.      Per HPI unless specifically indicated above     Objective:    BP 133/77   Pulse (!) 51   Temp 98 F (36.7 C) (Oral)   Wt 180 lb 12.8 oz (82 kg)   SpO2 97%   BMI 27.29 kg/m   Wt Readings from Last 3 Encounters:  10/23/23 180 lb 12.8 oz (82 kg)  09/11/23 180 lb 3.2 oz (81.7 kg)  05/31/23 178 lb 3.2 oz (80.8 kg)    Physical Exam Vitals and nursing note reviewed.  Constitutional:      General: He is not in acute distress.    Appearance: Normal appearance. He is not ill-appearing, toxic-appearing or diaphoretic.  HENT:     Head:  Normocephalic and atraumatic.     Right Ear: External ear normal.     Left Ear: External ear normal.     Nose: Nose normal.     Mouth/Throat:     Mouth: Mucous membranes are moist.     Pharynx: Oropharynx is clear.  Eyes:     General: No scleral icterus.       Right eye: No discharge.        Left eye: No discharge.     Extraocular Movements: Extraocular movements intact.     Conjunctiva/sclera: Conjunctivae normal.     Pupils: Pupils are equal, round, and reactive to light.  Cardiovascular:     Rate and Rhythm: Normal rate and regular rhythm.     Pulses: Normal pulses.     Heart sounds: Normal heart sounds. No murmur heard.    No friction rub. No gallop.  Pulmonary:     Effort: Pulmonary effort is normal. No respiratory distress.     Breath sounds: Normal breath sounds. No stridor. No wheezing, rhonchi or rales.  Chest:     Chest wall: No tenderness.  Musculoskeletal:        General: Normal range of motion.     Cervical back: Normal range of motion and neck supple.  Skin:    General: Skin is warm and dry.     Capillary Refill: Capillary refill takes less  than 2 seconds.     Coloration: Skin is not jaundiced or pale.     Findings: No bruising, erythema, lesion or rash.  Neurological:     General: No focal deficit present.     Mental Status: He is alert and oriented to person, place, and time. Mental status is at baseline.  Psychiatric:        Mood and Affect: Mood normal.        Behavior: Behavior normal.        Thought Content: Thought content normal.        Judgment: Judgment normal.     Results for orders placed or performed in visit on 09/11/23  Microalbumin, Urine Waived   Collection Time: 09/11/23  4:12 PM  Result Value Ref Range   Microalb, Ur Waived 80 (H) 0 - 19 mg/L   Creatinine, Urine Waived 300 10 - 300 mg/dL   Microalb/Creat Ratio 30-300 (H) <30 mg/g  Comprehensive metabolic panel   Collection Time: 09/11/23  4:13 PM  Result Value Ref Range   Glucose  174 (H) 70 - 99 mg/dL   BUN 26 8 - 27 mg/dL   Creatinine, Ser 1.88 0.76 - 1.27 mg/dL   eGFR 62 >41 YS/AYT/0.16   BUN/Creatinine Ratio 22 10 - 24   Sodium 140 134 - 144 mmol/L   Potassium 4.1 3.5 - 5.2 mmol/L   Chloride 102 96 - 106 mmol/L   CO2 20 20 - 29 mmol/L   Calcium 9.7 8.6 - 10.2 mg/dL   Total Protein 7.6 6.0 - 8.5 g/dL   Albumin 4.9 (H) 3.8 - 4.8 g/dL   Globulin, Total 2.7 1.5 - 4.5 g/dL   Bilirubin Total 0.6 0.0 - 1.2 mg/dL   Alkaline Phosphatase 117 44 - 121 IU/L   AST 20 0 - 40 IU/L   ALT 20 0 - 44 IU/L  CBC with Differential/Platelet   Collection Time: 09/11/23  4:13 PM  Result Value Ref Range   WBC 6.3 3.4 - 10.8 x10E3/uL   RBC 4.85 4.14 - 5.80 x10E6/uL   Hemoglobin 15.7 13.0 - 17.7 g/dL   Hematocrit 01.0 93.2 - 51.0 %   MCV 95 79 - 97 fL   MCH 32.4 26.6 - 33.0 pg   MCHC 34.0 31.5 - 35.7 g/dL   RDW 35.5 73.2 - 20.2 %   Platelets 277 150 - 450 x10E3/uL   Neutrophils 53 Not Estab. %   Lymphs 36 Not Estab. %   Monocytes 7 Not Estab. %   Eos 2 Not Estab. %   Basos 1 Not Estab. %   Neutrophils Absolute 3.4 1.4 - 7.0 x10E3/uL   Lymphocytes Absolute 2.2 0.7 - 3.1 x10E3/uL   Monocytes Absolute 0.4 0.1 - 0.9 x10E3/uL   EOS (ABSOLUTE) 0.1 0.0 - 0.4 x10E3/uL   Basophils Absolute 0.1 0.0 - 0.2 x10E3/uL   Immature Granulocytes 1 Not Estab. %   Immature Grans (Abs) 0.0 0.0 - 0.1 x10E3/uL  Lipid Panel w/o Chol/HDL Ratio   Collection Time: 09/11/23  4:13 PM  Result Value Ref Range   Cholesterol, Total 229 (H) 100 - 199 mg/dL   Triglycerides 542 (H) 0 - 149 mg/dL   HDL 43 >70 mg/dL   VLDL Cholesterol Cal 61 (H) 5 - 40 mg/dL   LDL Chol Calc (NIH) 623 (H) 0 - 99 mg/dL  TSH   Collection Time: 09/11/23  4:13 PM  Result Value Ref Range   TSH 1.650 0.450 - 4.500 uIU/mL  Assessment & Plan:   Problem List Items Addressed This Visit       Nervous and Auditory   Osteoarthritis of spine with radiculopathy, lumbar region - Primary   Significantly better with  meloxicam. Continue current regimen. Continue to monitor. Call with any concerns.       Relevant Medications   meloxicam (MOBIC) 15 MG tablet     Follow up plan: Return in about 20 weeks (around 03/11/2024).

## 2023-10-23 NOTE — Assessment & Plan Note (Signed)
Significantly better with meloxicam. Continue current regimen. Continue to monitor. Call with any concerns.

## 2023-11-29 DIAGNOSIS — H26493 Other secondary cataract, bilateral: Secondary | ICD-10-CM | POA: Diagnosis not present

## 2023-11-29 DIAGNOSIS — H43813 Vitreous degeneration, bilateral: Secondary | ICD-10-CM | POA: Diagnosis not present

## 2024-01-22 ENCOUNTER — Ambulatory Visit: Payer: Self-pay | Admitting: Emergency Medicine

## 2024-01-22 ENCOUNTER — Encounter: Payer: Self-pay | Admitting: Family Medicine

## 2024-01-22 ENCOUNTER — Ambulatory Visit (INDEPENDENT_AMBULATORY_CARE_PROVIDER_SITE_OTHER): Admitting: Family Medicine

## 2024-01-22 VITALS — BP 122/68 | HR 64 | Ht 68.0 in | Wt 180.2 lb

## 2024-01-22 VITALS — BP 138/76 | Ht 68.0 in | Wt 177.6 lb

## 2024-01-22 DIAGNOSIS — Z0000 Encounter for general adult medical examination without abnormal findings: Secondary | ICD-10-CM

## 2024-01-22 DIAGNOSIS — M4726 Other spondylosis with radiculopathy, lumbar region: Secondary | ICD-10-CM | POA: Diagnosis not present

## 2024-01-22 DIAGNOSIS — J069 Acute upper respiratory infection, unspecified: Secondary | ICD-10-CM | POA: Diagnosis not present

## 2024-01-22 MED ORDER — CYCLOBENZAPRINE HCL 10 MG PO TABS: ORAL_TABLET | ORAL | 1 refills | Status: DC

## 2024-01-22 MED ORDER — PREDNISONE 10 MG PO TABS: 10.0000 mg | ORAL_TABLET | Freq: Every day | ORAL | 0 refills | Status: DC

## 2024-01-22 MED ORDER — MELOXICAM 15 MG PO TABS: 15.0000 mg | ORAL_TABLET | Freq: Every day | ORAL | 1 refills | Status: DC

## 2024-01-22 MED ORDER — TRIAMCINOLONE ACETONIDE 40 MG/ML IJ SUSP
40.0000 mg | Freq: Once | INTRAMUSCULAR | Status: AC
Start: 2024-01-22 — End: ?

## 2024-01-22 NOTE — Patient Instructions (Addendum)
 Zachary Hansen , Thank you for taking time to come for your Medicare Wellness Visit. I appreciate your ongoing commitment to your health goals. Please review the following plan we discussed and let me know if I can assist you in the future.   Referrals/Orders/Follow-Ups/Clinician Recommendations: Keep up the good work!  This is a list of the screening recommended for you and due dates:  Health Maintenance  Topic Date Due   Flu Shot  05/02/2024   Medicare Annual Wellness Visit  01/21/2025   DTaP/Tdap/Td vaccine (3 - Td or Tdap) 05/28/2028   Pneumonia Vaccine  Completed   Zoster (Shingles) Vaccine  Completed   HPV Vaccine  Aged Out   Meningitis B Vaccine  Aged Out   Colon Cancer Screening  Discontinued   COVID-19 Vaccine  Discontinued   Hepatitis C Screening  Discontinued   Cologuard (Stool DNA test)  Discontinued    Advanced directives: (Copy Requested) Please bring a copy of your health care power of attorney and living will to the office to be added to your chart at your convenience. You can mail to [facility] [address] [place], [place] [address] or email to [email or web address]  Next Medicare Annual Wellness Visit scheduled for next year: Yes, 01/27/25 @ 11:20am (in person visit)

## 2024-01-22 NOTE — Progress Notes (Signed)
 Subjective:   Zachary Hansen is a 83 y.o. who presents for a Medicare Wellness preventive visit.  Visit Complete: In person   Persons Participating in Visit: Patient.  AWV Questionnaire: No: Patient Medicare AWV questionnaire was not completed prior to this visit.  Cardiac Risk Factors include: advanced age (>80men, >67 women);male gender;dyslipidemia;hypertension;Other (see comment), Risk factor comments: OSA (cpap)     Objective:    Today's Vitals   01/22/24 1059 01/22/24 1103  BP: 138/76   Weight: 177 lb 9.6 oz (80.6 kg)   Height: 5\' 8"  (1.727 m)   PainSc:  6    Body mass index is 27 kg/m.     01/22/2024   11:15 AM 01/15/2023   10:44 AM 11/16/2021    9:55 AM 01/24/2017   10:44 AM  Advanced Directives  Does Patient Have a Medical Advance Directive? No No Yes Yes  Type of Advance Directive   Healthcare Power of Attorney Living will;Healthcare Power of Attorney  Does patient want to make changes to medical advance directive?    Yes (MAU/Ambulatory/Procedural Areas - Information given)  Copy of Healthcare Power of Attorney in Chart?   No - copy requested No - copy requested  Would patient like information on creating a medical advance directive? No - Patient declined No - Patient declined      Current Medications (verified) Outpatient Encounter Medications as of 01/22/2024  Medication Sig   amLODipine  (NORVASC ) 5 MG tablet Take 1 tablet (5 mg total) by mouth daily.   Cyanocobalamin (VITAMIN B 12 PO) Take 1,000 mcg by mouth daily.   cyclobenzaprine  (FLEXERIL ) 10 MG tablet TAKE 1 TABLET BY MOUTH EVERYDAY AT BEDTIME   FLUoxetine  (PROZAC ) 40 MG capsule Take 1 capsule (40 mg total) by mouth daily.   Misc Natural Products (OSTEO BI-FLEX JOINT SHIELD PO) Take by mouth.   Multiple Vitamin (MULTI-VITAMINS) TABS Take by mouth.   valsartan  (DIOVAN ) 40 MG tablet Take 2 tablets (80 mg total) by mouth daily.   VITAMIN E PO Take by mouth daily.   meloxicam  (MOBIC ) 15 MG tablet Take  1 tablet (15 mg total) by mouth daily. (Patient not taking: Reported on 01/22/2024)   No facility-administered encounter medications on file as of 01/22/2024.    Allergies (verified) Lisinopril    History: Past Medical History:  Diagnosis Date   ADHD (attention deficit hyperactivity disorder)    Depression    Prostate cancer (HCC)    Sleep apnea    Past Surgical History:  Procedure Laterality Date   NASAL SEPTUM SURGERY     prostate cancer surgery     THROAT SURGERY     Due to snoring   Family History  Problem Relation Age of Onset   Arthritis Mother    Heart disease Father    Cancer Father    Heart disease Sister    Cancer Sister    Sleep apnea Brother    Alzheimer's disease Maternal Grandfather    Heart disease Paternal Grandmother    Diverticulitis Sister    Sleep apnea Sister    Social History   Socioeconomic History   Marital status: Married    Spouse name: Benigno Brakeman   Number of children: 1   Years of education: Not on file   Highest education level: Not on file  Occupational History   Occupation: retired  Tobacco Use   Smoking status: Former    Current packs/day: 0.00    Average packs/day: 0.3 packs/day for 7.0 years (2.1 ttl pk-yrs)  Types: Cigarettes    Start date: 35    Quit date: 10/02/1968    Years since quitting: 55.3    Passive exposure: Past   Smokeless tobacco: Never  Vaping Use   Vaping status: Never Used  Substance and Sexual Activity   Alcohol use: Yes    Alcohol/week: 3.0 - 4.0 standard drinks of alcohol    Types: 3 - 4 Standard drinks or equivalent per week    Comment: 1 mixed drink 3-4 times per week   Drug use: No   Sexual activity: Never  Other Topics Concern   Not on file  Social History Narrative   1 daughter lives in [place]    Social Drivers of Health   Financial Resource Strain: Low Risk  (01/22/2024)   Overall Financial Resource Strain (CARDIA)    Difficulty of Paying Living Expenses: Not hard at all  Food  Insecurity: No Food Insecurity (01/22/2024)   Hunger Vital Sign    Worried About Running Out of Food in the Last Year: Never true    Ran Out of Food in the Last Year: Never true  Transportation Needs: No Transportation Needs (01/22/2024)   PRAPARE - Administrator, Civil Service (Medical): No    Lack of Transportation (Non-Medical): No  Physical Activity: Inactive (01/22/2024)   Exercise Vital Sign    Days of Exercise per Week: 0 days    Minutes of Exercise per Session: 0 min  Stress: No Stress Concern Present (01/22/2024)   [employer] of Occupational Health - Occupational Stress Questionnaire    Feeling of Stress : Not at all  Social Connections: Moderately Isolated (01/22/2024)   Social Connection and Isolation Panel [NHANES]    Frequency of Communication with Friends and Family: Twice a week    Frequency of Social Gatherings with Friends and Family: Never    Attends Religious Services: More than 4 times per year    Active Member of [employer] or Organizations: No    Attends Engineer, structural: Never    Marital Status: Married    Tobacco Counseling Counseling given: Not Answered    Clinical Intake:  Pre-visit preparation completed: Yes  Pain : 0-10 Pain Score: 6  Pain Type: Chronic pain Pain Location: Back Pain Descriptors / Indicators: Aching     BMI - recorded: 27 Nutritional Status: BMI 25 -29 Overweight Nutritional Risks: None Diabetes: No  Lab Results  Component Value Date   HGBA1C 5.9 01/25/2021   HGBA1C 5.7 01/24/2016     How often do you need to have someone help you when you read instructions, pamphlets, or other written materials from your doctor or pharmacy?: 1 - Never  Interpreter Needed?: No  Information entered by :: Jaunita Messier, CMA   Activities of Daily Living     01/22/2024   11:05 AM  In your present state of health, do you have any difficulty performing the following activities:  Hearing? 1  Comment wears  hearing aids  Vision? 0  Difficulty concentrating or making decisions? 1  Comment "maybe just a little bit"  Walking or climbing stairs? 1  Comment balance is sometimes off  Dressing or bathing? 0  Doing errands, shopping? 0  Preparing Food and eating ? N  Using the Toilet? N  In the past six months, have you accidently leaked urine? N  Do you have problems with loss of bowel control? N  Managing your Medications? N  Managing your Finances? N  Housekeeping or managing your  Housekeeping? N    Patient Care Team: Solomon Dupre, DO as PCP - General (Family Medicine) Reche Canales, OD (Optometry) Administration, [employer] any recent [employer] you may have received from other than [facility] providers in the past year (date may be approximate).     Assessment:   This is a routine wellness examination for Jermani.  Hearing/Vision screen Hearing Screening - Comments:: Wears hearing aids Vision Screening - Comments:: Gets routine eye exams, Dr. Barrie Borer, [place]   Goals Addressed             This Visit's Progress    Increase physical activity         Depression Screen     01/22/2024   11:13 AM 10/23/2023   10:14 AM 09/11/2023    4:12 PM 05/31/2023    3:15 PM 01/15/2023   10:43 AM 11/13/2022    2:04 PM 05/11/2022   11:25 AM  PHQ 2/9 Scores  PHQ - 2 Score 2 0 0 0 0 0 2  PHQ- 9 Score 3 5 5 6  0 4 5    Fall Risk     01/22/2024   11:17 AM 10/23/2023   10:12 AM 09/11/2023    4:12 PM 05/31/2023    3:15 PM 01/15/2023   10:45 AM  Fall Risk   Falls in the past year? 0 0 0 0 0  Number falls in past yr: 0 0 0 0 0  Injury with Fall? 0 0 0 0 0  Risk for fall due to : No Fall Risks No Fall Risks No Fall Risks No Fall Risks No Fall Risks  Follow up Falls prevention discussed;Falls evaluation completed Falls evaluation completed Falls evaluation completed Falls evaluation completed Falls prevention discussed;Falls evaluation completed    MEDICARE RISK AT HOME:   Medicare Risk at Home Any stairs in or around the home?: Yes If so, are there any without handrails?: No Home free of loose throw rugs in walkways, pet beds, electrical cords, etc?: Yes Adequate lighting in your home to reduce risk of falls?: Yes Life alert?: No Use of a cane, walker or w/c?: No Grab bars in the bathroom?: No Shower chair or bench in shower?: Yes Elevated toilet seat or a handicapped toilet?: No  TIMED UP AND GO:  Was the test performed?  Yes  Length of time to ambulate 10 feet: 5  sec Gait steady and fast without use of assistive device  Cognitive Function: 6CIT completed        01/22/2024   11:18 AM 01/15/2023   10:49 AM 01/09/2020    2:23 PM 01/09/2020    2:20 PM 05/28/2018    1:25 PM  6CIT Screen  What Year? 0 points 0 points 0 points 0 points 0 points  What month? 0 points 0 points 0 points 0 points 0 points  What time? 0 points 0 points 0 points 0 points 0 points  Count back from 20 0 points 0 points 0 points 0 points 0 points  Months in reverse 0 points 0 points 0 points 0 points 0 points  Repeat phrase 0 points 0 points 0 points 0 points 0 points  Total Score 0 points 0 points 0 points 0 points 0 points    Immunizations Immunization History  Administered Date(s) Administered   Fluad Quad(high Dose 65+) 07/05/2021, 11/13/2022   Fluad Trivalent(High Dose 65+) 09/11/2023   Janssen (J&J) SARS-COV-2 Vaccination 04/19/2020, 11/04/2020, 01/26/2021   Pneumococcal Conjugate-13 03/17/2015, 05/28/2018  Pneumococcal Polysaccharide-23 10/24/2011, 10/02/2012   Td 05/28/2018   Tdap 10/24/2011   Zoster Recombinant(Shingrix) 11/04/2020, 04/04/2023   Zoster, Live 04/16/2012, 10/02/2012    Screening Tests Health Maintenance  Topic Date Due   INFLUENZA VACCINE  05/02/2024   Medicare Annual Wellness (AWV)  01/21/2025   DTaP/Tdap/Td (3 - Td or Tdap) 05/28/2028   Pneumonia Vaccine 27+ Years old  Completed   Zoster Vaccines- Shingrix  Completed   HPV VACCINES   Aged Out   Meningococcal B Vaccine  Aged Out   Colonoscopy  Discontinued   COVID-19 Vaccine  Discontinued   Hepatitis C Screening  Discontinued   Fecal DNA (Cologuard)  Discontinued    Health Maintenance  There are no preventive care reminders to display for this patient.  Health Maintenance Items Addressed: See Nurse Notes  Additional Screening:  Vision Screening: Recommended annual ophthalmology exams for early detection of glaucoma and other disorders of the eye.  Dental Screening: Recommended annual dental exams for proper oral hygiene  Community Resource Referral / Chronic Care Management: CRR required this visit?  No   CCM required this visit?  No     Plan:     I have personally reviewed and noted the following in the patient's chart:   Medical and social history Use of alcohol, tobacco or illicit drugs  Current medications and supplements including opioid prescriptions. Patient is not currently taking opioid prescriptions. Functional ability and status Nutritional status Physical activity Advanced directives List of other physicians Hospitalizations, surgeries, and ER visits in previous 12 months Vitals Screenings to include cognitive, depression, and falls Referrals and appointments  In addition, I have reviewed and discussed with patient certain preventive protocols, quality metrics, and best practice recommendations. A written personalized care plan for preventive services as well as general preventive health recommendations were provided to patient.     Jaunita Messier, [facility]   01/22/2024   After Visit Summary: (In Person-Printed) AVS printed and given to the patient  Notes: Nothing significant to report at this time.

## 2024-01-22 NOTE — Assessment & Plan Note (Signed)
 Stopped his meloxicam . Will restart after a 12 day steroid taper. Flexeril  PRN and start stretches. Call with any concerns. Continue to monitor. Declines PT at this time.

## 2024-01-22 NOTE — Progress Notes (Signed)
 BP 122/68 (BP Location: Left Arm, Patient Position: Sitting, Cuff Size: Normal)   Pulse 64   Ht 5\' 8"  (1.727 m)   Wt 180 lb 3.2 oz (81.7 kg)   SpO2 95%   BMI 27.40 kg/m    Subjective:    Patient ID: Zachary Hansen, male    DOB: July 22, 1941, 83 y.o.   MRN: [id number]  HPI: Zachary Hansen is a 83 y.o. male  Chief Complaint  Patient presents with   Back Pain    Pt states that he has been having pain for the last 2 weeks. Noticed after working in the yard.    BACK PAIN Duration: 2 weeks Mechanism of injury: working in the yard Location: bilateral and low back Onset: sudden Severity: moderate to severe Quality: spasms,  Frequency: constant Radiation: into his hips bilaterally Aggravating factors: bending over Alleviating factors: exercising (calisthenics) Status: better Treatments attempted: stretching  Relief with NSAIDs?: No NSAIDs Taken Nighttime pain:  yes Paresthesias / decreased sensation:  no Bowel / bladder incontinence:  no Fevers:  no Dysuria / urinary frequency:  no  UPPER RESPIRATORY TRACT INFECTION Duration: 3 weeks  Worst symptom: congestion at night Fever: yes Cough: yes Shortness of breath: yes Wheezing: yes Chest pain: no Chest tightness: no Chest congestion: no Nasal congestion: yes Runny nose: yes Post nasal drip: yes Sneezing: yes Sore throat: yes Swollen glands: no Sinus pressure: no Headache: yes Face pain: no Toothache: no Ear pain: no  Ear pressure: yes  Eyes red/itching: yes Eye drainage/crusting: no  Vomiting: no Rash: no Fatigue: yes Sick contacts: no Strep contacts: no  Context: better Recurrent sinusitis: no Relief with OTC cold/cough medications: no  Treatments attempted: nyquil   Relevant past medical, surgical, family and social history reviewed and updated as indicated. Interim medical history since our last visit reviewed. Allergies and medications reviewed and updated.  Review of Systems  Constitutional:   Positive for fatigue and fever. Negative for activity change, appetite change, chills, diaphoresis and unexpected weight change.  HENT:  Positive for congestion, postnasal drip, sneezing and sore throat. Negative for dental problem, drooling, ear discharge, ear pain, facial swelling, hearing loss, mouth sores, nosebleeds, rhinorrhea, sinus pressure, sinus pain, tinnitus, trouble swallowing and voice change.   Eyes: Negative.   Respiratory:  Positive for cough. Negative for apnea, choking, chest tightness, shortness of breath, wheezing and stridor.   Cardiovascular: Negative.   Gastrointestinal: Negative.   Psychiatric/Behavioral: Negative.      Per HPI unless specifically indicated above     Objective:    BP 122/68 (BP Location: Left Arm, Patient Position: Sitting, Cuff Size: Normal)   Pulse 64   Ht 5\' 8"  (1.727 m)   Wt 180 lb 3.2 oz (81.7 kg)   SpO2 95%   BMI 27.40 kg/m   Wt Readings from Last 3 Encounters:  01/22/24 180 lb 3.2 oz (81.7 kg)  01/22/24 177 lb 9.6 oz (80.6 kg)  10/23/23 180 lb 12.8 oz (82 kg)    Physical Exam Vitals and nursing note reviewed.  Constitutional:      General: He is not in acute distress.    Appearance: Normal appearance. He is not ill-appearing, toxic-appearing or diaphoretic.  HENT:     Head: Normocephalic and atraumatic.     Right Ear: External ear normal.     Left Ear: External ear normal.     Nose: Nose normal.     Mouth/Throat:     Mouth: Mucous membranes are moist.  Pharynx: Oropharynx is clear.  Eyes:     General: No scleral icterus.       Right eye: No discharge.        Left eye: No discharge.     Extraocular Movements: Extraocular movements intact.     Conjunctiva/sclera: Conjunctivae normal.     Pupils: Pupils are equal, round, and reactive to light.  Cardiovascular:     Rate and Rhythm: Normal rate and regular rhythm.     Pulses: Normal pulses.     Heart sounds: Normal heart sounds. No murmur heard.    No friction rub. No  gallop.  Pulmonary:     Effort: Pulmonary effort is normal. No respiratory distress.     Breath sounds: Normal breath sounds. No stridor. No wheezing, rhonchi or rales.  Chest:     Chest wall: No tenderness.  Musculoskeletal:        General: Tenderness present. No swelling, deformity or signs of injury. Normal range of motion.     Cervical back: Normal range of motion and neck supple.     Right lower leg: No edema.     Left lower leg: No edema.  Skin:    General: Skin is warm and dry.     Capillary Refill: Capillary refill takes less than 2 seconds.     Coloration: Skin is not jaundiced or pale.     Findings: No bruising, erythema, lesion or rash.  Neurological:     General: No focal deficit present.     Mental Status: He is alert and oriented to person, place, and time. Mental status is at baseline.  Psychiatric:        Mood and Affect: Mood normal.        Behavior: Behavior normal.        Thought Content: Thought content normal.        Judgment: Judgment normal.     Results for orders placed or performed in visit on 09/11/23  Microalbumin, Urine Waived   Collection Time: 09/11/23  4:12 PM  Result Value Ref Range   Microalb, Ur Waived 80 (H) 0 - 19 mg/L   Creatinine, Urine Waived 300 10 - 300 mg/dL   Microalb/Creat Ratio 30-300 (H) <30 mg/g  Comprehensive metabolic panel   Collection Time: 09/11/23  4:13 PM  Result Value Ref Range   Glucose 174 (H) 70 - 99 mg/dL   BUN 26 8 - 27 mg/dL   Creatinine, Ser 2.95 0.76 - 1.27 mg/dL   eGFR 62 >62 ZH/YQM/5.78   BUN/Creatinine Ratio 22 10 - 24   Sodium 140 134 - 144 mmol/L   Potassium 4.1 3.5 - 5.2 mmol/L   Chloride 102 96 - 106 mmol/L   CO2 20 20 - 29 mmol/L   Calcium 9.7 8.6 - 10.2 mg/dL   Total Protein 7.6 6.0 - 8.5 g/dL   Albumin 4.9 (H) 3.8 - 4.8 g/dL   Globulin, Total 2.7 1.5 - 4.5 g/dL   Bilirubin Total 0.6 0.0 - 1.2 mg/dL   Alkaline Phosphatase 117 44 - 121 IU/L   AST 20 0 - 40 IU/L   ALT 20 0 - 44 IU/L  CBC with  Differential/Platelet   Collection Time: 09/11/23  4:13 PM  Result Value Ref Range   WBC 6.3 3.4 - 10.8 x10E3/uL   RBC 4.85 4.14 - 5.80 x10E6/uL   Hemoglobin 15.7 13.0 - 17.7 g/dL   Hematocrit 46.9 62.9 - 51.0 %   MCV 95 79 - 97 fL  MCH 32.4 26.6 - 33.0 pg   MCHC 34.0 31.5 - 35.7 g/dL   RDW 47.8 29.5 - 62.1 %   Platelets 277 150 - 450 x10E3/uL   Neutrophils 53 Not Estab. %   Lymphs 36 Not Estab. %   Monocytes 7 Not Estab. %   Eos 2 Not Estab. %   Basos 1 Not Estab. %   Neutrophils Absolute 3.4 1.4 - 7.0 x10E3/uL   Lymphocytes Absolute 2.2 0.7 - 3.1 x10E3/uL   Monocytes Absolute 0.4 0.1 - 0.9 x10E3/uL   EOS (ABSOLUTE) 0.1 0.0 - 0.4 x10E3/uL   Basophils Absolute 0.1 0.0 - 0.2 x10E3/uL   Immature Granulocytes 1 Not Estab. %   Immature Grans (Abs) 0.0 0.0 - 0.1 x10E3/uL  Lipid Panel w/o Chol/HDL Ratio   Collection Time: 09/11/23  4:13 PM  Result Value Ref Range   Cholesterol, Total 229 (H) 100 - 199 mg/dL   Triglycerides 308 (H) 0 - 149 mg/dL   HDL 43 >65 mg/dL   VLDL Cholesterol Cal 61 (H) 5 - 40 mg/dL   LDL Chol Calc (NIH) 784 (H) 0 - 99 mg/dL  TSH   Collection Time: 09/11/23  4:13 PM  Result Value Ref Range   TSH 1.650 0.450 - 4.500 uIU/mL      Assessment & Plan:   Problem List Items Addressed This Visit       Nervous and Auditory   Osteoarthritis of spine with radiculopathy, lumbar region - Primary   Stopped his meloxicam . Will restart after a 12 day steroid taper. Flexeril  PRN and start stretches. Call with any concerns. Continue to monitor. Declines PT at this time.       Relevant Medications   meloxicam  (MOBIC ) 15 MG tablet   cyclobenzaprine  (FLEXERIL ) 10 MG tablet   predniSONE  (DELTASONE ) 10 MG tablet   triamcinolone  acetonide (KENALOG -40) injection 40 mg   Other Visit Diagnoses       Upper respiratory tract infection, unspecified type       No sign of sinusitis. Lungs clear. Will start him on steroid taper. Call if not getting better or getting worse.    Relevant Medications   triamcinolone  acetonide (KENALOG -40) injection 40 mg        Follow up plan: Return in about 6 weeks (around 03/04/2024).

## 2024-02-21 ENCOUNTER — Other Ambulatory Visit: Payer: Self-pay | Admitting: Family Medicine

## 2024-02-22 NOTE — Telephone Encounter (Signed)
 Requested Prescriptions  Pending Prescriptions Disp Refills   amLODipine  (NORVASC ) 5 MG tablet [Pharmacy Med Name: AMLODIPINE  BESYLATE 5 MG TAB] 90 tablet 0    Sig: TAKE 1 TABLET (5 MG TOTAL) BY MOUTH DAILY.     Cardiovascular: Calcium Channel Blockers 2 Passed - 02/22/2024  2:51 PM      Passed - Last BP in normal range    BP Readings from Last 1 Encounters:  01/22/24 122/68         Passed - Last Heart Rate in normal range    Pulse Readings from Last 1 Encounters:  01/22/24 64         Passed - Valid encounter within last 6 months    Recent Outpatient Visits           1 month ago Osteoarthritis of spine with radiculopathy, lumbar region   [facility] [place], Zachary Montane, DO       Future Appointments             In 3 weeks Zachary Hansen, Zachary Montane, DO [facility], [facility]

## 2024-02-28 ENCOUNTER — Other Ambulatory Visit: Payer: Self-pay | Admitting: Family Medicine

## 2024-02-29 NOTE — Telephone Encounter (Signed)
 Requested Prescriptions  Pending Prescriptions Disp Refills   FLUoxetine  (PROZAC ) 40 MG capsule [Pharmacy Med Name: FLUOXETINE  HCL 40 MG CAPSULE] 90 capsule 0    Sig: TAKE 1 CAPSULE (40 MG TOTAL) BY MOUTH DAILY.     Psychiatry:  Antidepressants - SSRI Passed - 02/29/2024 10:22 PM      Passed - Completed PHQ-2 or PHQ-9 in the last 360 days      Passed - Valid encounter within last 6 months    Recent Outpatient Visits           1 month ago Osteoarthritis of spine with radiculopathy, lumbar region   [facility] Solomon Dupre, DO       Future Appointments             In 2 weeks Zachary Hansen, Zachary Montane, DO [facility], [facility]

## 2024-03-06 ENCOUNTER — Ambulatory Visit (INDEPENDENT_AMBULATORY_CARE_PROVIDER_SITE_OTHER): Admitting: Family Medicine

## 2024-03-06 ENCOUNTER — Encounter: Payer: Self-pay | Admitting: Family Medicine

## 2024-03-06 VITALS — BP 135/75 | HR 50 | Ht 68.0 in | Wt 180.0 lb

## 2024-03-06 DIAGNOSIS — E782 Mixed hyperlipidemia: Secondary | ICD-10-CM

## 2024-03-06 DIAGNOSIS — I129 Hypertensive chronic kidney disease with stage 1 through stage 4 chronic kidney disease, or unspecified chronic kidney disease: Secondary | ICD-10-CM

## 2024-03-06 DIAGNOSIS — F339 Major depressive disorder, recurrent, unspecified: Secondary | ICD-10-CM | POA: Diagnosis not present

## 2024-03-06 MED ORDER — FLUOXETINE HCL 40 MG PO CAPS: 40.0000 mg | ORAL_CAPSULE | Freq: Every day | ORAL | 1 refills | Status: DC

## 2024-03-06 MED ORDER — CYCLOBENZAPRINE HCL 10 MG PO TABS: ORAL_TABLET | ORAL | 0 refills | Status: DC

## 2024-03-06 MED ORDER — VALSARTAN 40 MG PO TABS: 80.0000 mg | ORAL_TABLET | Freq: Every day | ORAL | 1 refills | Status: DC

## 2024-03-06 MED ORDER — MELOXICAM 15 MG PO TABS: 15.0000 mg | ORAL_TABLET | Freq: Every day | ORAL | 1 refills | Status: DC

## 2024-03-06 MED ORDER — AMLODIPINE BESYLATE 5 MG PO TABS: 5.0000 mg | ORAL_TABLET | Freq: Every day | ORAL | 1 refills | Status: DC

## 2024-03-06 NOTE — Assessment & Plan Note (Signed)
 Under good control on current regimen. Continue current regimen. Continue to monitor. Call with any concerns. Refills given. Labs drawn today.

## 2024-03-06 NOTE — Assessment & Plan Note (Signed)
 Rechecking labs today. Await results. Treat as needed.

## 2024-03-06 NOTE — Assessment & Plan Note (Signed)
 Under good control on current regimen. Continue current regimen. Continue to monitor. Call with any concerns. Refills given.

## 2024-03-06 NOTE — Progress Notes (Signed)
 BP 135/75 (BP Location: Left Arm, Patient Position: Sitting, Cuff Size: Normal)   Pulse (!) 50   Ht 5\' 8"  (1.727 m)   Wt 180 lb (81.6 kg)   SpO2 98%   BMI 27.37 kg/m    Subjective:    Patient ID: Zachary Hansen, male    DOB: October 22, 1940, 83 y.o.   MRN: [id number]  HPI: Zachary Hansen is a 83 y.o. male  Chief Complaint  Patient presents with   Hypertension   Hyperlipidemia   HYPERTENSION / HYPERLIPIDEMIA Satisfied with current treatment? yes Duration of hypertension: chronic BP monitoring frequency: not checking BP medication side effects: no Past BP meds: valsartan , amlodipine  Duration of hyperlipidemia: chronic Cholesterol medication side effects: N/A Cholesterol supplements: none Past cholesterol medications: none Medication compliance: excellent compliance Aspirin: no Recent stressors: no Recurrent headaches: no Visual changes: no Palpitations: no Dyspnea: no Chest pain: no Lower extremity edema: no Dizzy/lightheaded: no  DEPRESSION Mood status: controlled Satisfied with current treatment?: yes Symptom severity: mild  Duration of current treatment : chronic Side effects: no Medication compliance: excellent compliance Psychotherapy/counseling: no  Previous psychiatric medications: prozac  Depressed mood: no Anxious mood: no Anhedonia: no Significant weight loss or gain: no Insomnia: no  Fatigue: yes Feelings of worthlessness or guilt: no Impaired concentration/indecisiveness: no Suicidal ideations: no Hopelessness: no Crying spells: no    01/22/2024   11:13 AM 10/23/2023   10:14 AM 09/11/2023    4:12 PM 05/31/2023    3:15 PM 01/15/2023   10:43 AM  Depression screen PHQ 2/9  Decreased Interest 1 0 0 0 0  Down, Depressed, Hopeless 1 0 0 0 0  PHQ - 2 Score 2 0 0 0 0  Altered sleeping 0 2 2 2  0  Tired, decreased energy 1 2 3 3  0  Change in appetite 0 1 0 1 0  Feeling bad or failure about yourself  0 0 0 0 0  Trouble concentrating 0 0 0 0 0   Moving slowly or fidgety/restless 0 0 0 0 0  Suicidal thoughts 0 0 0 0 0  PHQ-9 Score 3 5 5 6  0  Difficult doing work/chores Not difficult at all Not difficult at all Not difficult at all Not difficult at all Not difficult at all    Relevant past medical, surgical, family and social history reviewed and updated as indicated. Interim medical history since our last visit reviewed. Allergies and medications reviewed and updated.  Review of Systems  Constitutional: Negative.   Respiratory: Negative.    Cardiovascular: Negative.   Musculoskeletal: Negative.   Neurological: Negative.   Psychiatric/Behavioral: Negative.      Per HPI unless specifically indicated above     Objective:     BP 135/75 (BP Location: Left Arm, Patient Position: Sitting, Cuff Size: Normal)   Pulse (!) 50   Ht 5\' 8"  (1.727 m)   Wt 180 lb (81.6 kg)   SpO2 98%   BMI 27.37 kg/m   Wt Readings from Last 3 Encounters:  03/06/24 180 lb (81.6 kg)  01/22/24 180 lb 3.2 oz (81.7 kg)  01/22/24 177 lb 9.6 oz (80.6 kg)    Physical Exam Vitals and nursing note reviewed.  Constitutional:      General: He is not in acute distress.    Appearance: Normal appearance. He is not ill-appearing, toxic-appearing or diaphoretic.  HENT:     Head: Normocephalic and atraumatic.     Right Ear: External ear normal.     Left  Ear: External ear normal.     Nose: Nose normal.     Mouth/Throat:     Mouth: Mucous membranes are moist.     Pharynx: Oropharynx is clear.  Eyes:     General: No scleral icterus.       Right eye: No discharge.        Left eye: No discharge.     Extraocular Movements: Extraocular movements intact.     Conjunctiva/sclera: Conjunctivae normal.     Pupils: Pupils are equal, round, and reactive to light.  Cardiovascular:     Rate and Rhythm: Normal rate and regular rhythm.     Pulses: Normal pulses.     Heart sounds: Normal heart sounds. No murmur heard.    No friction rub. No gallop.  Pulmonary:      Effort: Pulmonary effort is normal. No respiratory distress.     Breath sounds: Normal breath sounds. No stridor. No wheezing, rhonchi or rales.  Chest:     Chest wall: No tenderness.  Musculoskeletal:        General: Normal range of motion.     Cervical back: Normal range of motion and neck supple.  Skin:    General: Skin is warm and dry.     Capillary Refill: Capillary refill takes less than 2 seconds.     Coloration: Skin is not jaundiced or pale.     Findings: No bruising, erythema, lesion or rash.  Neurological:     General: No focal deficit present.     Mental Status: He is alert and oriented to person, place, and time. Mental status is at baseline.  Psychiatric:        Mood and Affect: Mood normal.        Behavior: Behavior normal.        Thought Content: Thought content normal.        Judgment: Judgment normal.     Results for orders placed or performed in visit on 09/11/23  Microalbumin, Urine Waived   Collection Time: 09/11/23  4:12 PM  Result Value Ref Range   Microalb, Ur Waived 80 (H) 0 - 19 mg/L   Creatinine, Urine Waived 300 10 - 300 mg/dL   Microalb/Creat Ratio 30-300 (H) <30 mg/g  Comprehensive metabolic panel   Collection Time: 09/11/23  4:13 PM  Result Value Ref Range   Glucose 174 (H) 70 - 99 mg/dL   BUN 26 8 - 27 mg/dL   Creatinine, Ser 1.61 0.76 - 1.27 mg/dL   eGFR 62 >09 UE/AVW/0.98   BUN/Creatinine Ratio 22 10 - 24   Sodium 140 134 - 144 mmol/L   Potassium 4.1 3.5 - 5.2 mmol/L   Chloride 102 96 - 106 mmol/L   CO2 20 20 - 29 mmol/L   Calcium 9.7 8.6 - 10.2 mg/dL   Total Protein 7.6 6.0 - 8.5 g/dL   Albumin 4.9 (H) 3.8 - 4.8 g/dL   Globulin, Total 2.7 1.5 - 4.5 g/dL   Bilirubin Total 0.6 0.0 - 1.2 mg/dL   Alkaline Phosphatase 117 44 - 121 IU/L   AST 20 0 - 40 IU/L   ALT 20 0 - 44 IU/L  CBC with Differential/Platelet   Collection Time: 09/11/23  4:13 PM  Result Value Ref Range   WBC 6.3 3.4 - 10.8 x10E3/uL   RBC 4.85 4.14 - 5.80 x10E6/uL    Hemoglobin 15.7 13.0 - 17.7 g/dL   Hematocrit 11.9 14.7 - 51.0 %   MCV 95 79 -  97 fL   MCH 32.4 26.6 - 33.0 pg   MCHC 34.0 31.5 - 35.7 g/dL   RDW 09.8 11.9 - 14.7 %   Platelets 277 150 - 450 x10E3/uL   Neutrophils 53 Not Estab. %   Lymphs 36 Not Estab. %   Monocytes 7 Not Estab. %   Eos 2 Not Estab. %   Basos 1 Not Estab. %   Neutrophils Absolute 3.4 1.4 - 7.0 x10E3/uL   Lymphocytes Absolute 2.2 0.7 - 3.1 x10E3/uL   Monocytes Absolute 0.4 0.1 - 0.9 x10E3/uL   EOS (ABSOLUTE) 0.1 0.0 - 0.4 x10E3/uL   Basophils Absolute 0.1 0.0 - 0.2 x10E3/uL   Immature Granulocytes 1 Not Estab. %   Immature Grans (Abs) 0.0 0.0 - 0.1 x10E3/uL  Lipid Panel w/o Chol/HDL Ratio   Collection Time: 09/11/23  4:13 PM  Result Value Ref Range   Cholesterol, Total 229 (H) 100 - 199 mg/dL   Triglycerides 829 (H) 0 - 149 mg/dL   HDL 43 >56 mg/dL   VLDL Cholesterol Cal 61 (H) 5 - 40 mg/dL   LDL Chol Calc (NIH) 213 (H) 0 - 99 mg/dL  TSH   Collection Time: 09/11/23  4:13 PM  Result Value Ref Range   TSH 1.650 0.450 - 4.500 uIU/mL      Assessment & Plan:   Problem List Items Addressed This Visit       Genitourinary   Benign hypertensive renal disease   Under good control on current regimen. Continue current regimen. Continue to monitor. Call with any concerns. Refills given. Labs drawn today.        Relevant Orders   CBC with Differential/Platelet   Comprehensive metabolic panel with GFR     Other   Depression, recurrent (HCC) - Primary   Under good control on current regimen. Continue current regimen. Continue to monitor. Call with any concerns. Refills given.        Relevant Medications   FLUoxetine  (PROZAC ) 40 MG capsule   Hyperlipidemia   Rechecking labs today. Await results. Treat as needed.       Relevant Medications   amLODipine  (NORVASC ) 5 MG tablet   valsartan  (DIOVAN ) 40 MG tablet   Other Relevant Orders   CBC with Differential/Platelet   Comprehensive metabolic panel with GFR    Lipid Panel w/o Chol/HDL Ratio     Follow up plan: Return in about 27 weeks (around 09/11/2024) for physical.

## 2024-03-07 ENCOUNTER — Ambulatory Visit: Admitting: Family Medicine

## 2024-03-07 LAB — CBC WITH DIFFERENTIAL/PLATELET
Basophils Absolute: 0.1 10*3/uL (ref 0.0–0.2)
Basos: 1 %
EOS (ABSOLUTE): 0.1 10*3/uL (ref 0.0–0.4)
Eos: 2 %
Hematocrit: 39.8 % (ref 37.5–51.0)
Hemoglobin: 13.4 g/dL (ref 13.0–17.7)
Immature Grans (Abs): 0 10*3/uL (ref 0.0–0.1)
Immature Granulocytes: 0 %
Lymphocytes Absolute: 1.3 10*3/uL (ref 0.7–3.1)
Lymphs: 24 %
MCH: 33.1 pg — ABNORMAL HIGH (ref 26.6–33.0)
MCHC: 33.7 g/dL (ref 31.5–35.7)
MCV: 98 fL — ABNORMAL HIGH (ref 79–97)
Monocytes Absolute: 0.5 10*3/uL (ref 0.1–0.9)
Monocytes: 10 %
Neutrophils Absolute: 3.2 10*3/uL (ref 1.4–7.0)
Neutrophils: 63 %
Platelets: 198 10*3/uL (ref 150–450)
RBC: 4.05 x10E6/uL — ABNORMAL LOW (ref 4.14–5.80)
RDW: 13.6 % (ref 11.6–15.4)
WBC: 5.2 10*3/uL (ref 3.4–10.8)

## 2024-03-07 LAB — COMPREHENSIVE METABOLIC PANEL WITH GFR
ALT: 16 IU/L (ref 0–44)
AST: 18 IU/L (ref 0–40)
Albumin: 4.4 g/dL (ref 3.8–4.8)
Alkaline Phosphatase: 100 IU/L (ref 44–121)
BUN/Creatinine Ratio: 20 (ref 10–24)
BUN: 20 mg/dL (ref 8–27)
Bilirubin Total: 0.7 mg/dL (ref 0.0–1.2)
CO2: 21 mmol/L (ref 20–29)
Calcium: 9.2 mg/dL (ref 8.6–10.2)
Chloride: 106 mmol/L (ref 96–106)
Creatinine, Ser: 1.01 mg/dL (ref 0.76–1.27)
Globulin, Total: 2.3 g/dL (ref 1.5–4.5)
Glucose: 111 mg/dL — ABNORMAL HIGH (ref 70–99)
Potassium: 4.2 mmol/L (ref 3.5–5.2)
Sodium: 140 mmol/L (ref 134–144)
Total Protein: 6.7 g/dL (ref 6.0–8.5)
eGFR: 75 mL/min/{1.73_m2} (ref >59)

## 2024-03-07 LAB — LIPID PANEL W/O CHOL/HDL RATIO
Cholesterol, Total: 210 mg/dL — ABNORMAL HIGH (ref 100–199)
HDL: 51 mg/dL (ref >39)
LDL Chol Calc (NIH): 136 mg/dL — ABNORMAL HIGH (ref 0–99)
Triglycerides: 130 mg/dL (ref 0–149)
VLDL Cholesterol Cal: 23 mg/dL (ref 5–40)

## 2024-03-10 ENCOUNTER — Ambulatory Visit: Payer: Self-pay | Admitting: Family Medicine

## 2024-03-10 NOTE — Telephone Encounter (Signed)
Result printed and mailed.

## 2024-03-10 NOTE — Progress Notes (Signed)
 Letter printed and mailed.

## 2024-03-14 ENCOUNTER — Ambulatory Visit: Payer: Self-pay | Admitting: Family Medicine

## 2024-06-06 DIAGNOSIS — H524 Presbyopia: Secondary | ICD-10-CM | POA: Diagnosis not present

## 2024-06-06 DIAGNOSIS — H52223 Regular astigmatism, bilateral: Secondary | ICD-10-CM | POA: Diagnosis not present

## 2024-06-06 DIAGNOSIS — H5213 Myopia, bilateral: Secondary | ICD-10-CM | POA: Diagnosis not present

## 2024-06-06 DIAGNOSIS — H26493 Other secondary cataract, bilateral: Secondary | ICD-10-CM | POA: Diagnosis not present

## 2024-06-06 DIAGNOSIS — H43813 Vitreous degeneration, bilateral: Secondary | ICD-10-CM | POA: Diagnosis not present

## 2024-06-16 DIAGNOSIS — J40 Bronchitis, not specified as acute or chronic: Secondary | ICD-10-CM | POA: Diagnosis not present

## 2024-06-16 DIAGNOSIS — J029 Acute pharyngitis, unspecified: Secondary | ICD-10-CM | POA: Diagnosis not present

## 2024-06-16 DIAGNOSIS — R6889 Other general symptoms and signs: Secondary | ICD-10-CM | POA: Diagnosis not present

## 2024-06-16 DIAGNOSIS — Z03818 Encounter for observation for suspected exposure to other biological agents ruled out: Secondary | ICD-10-CM | POA: Diagnosis not present

## 2024-08-04 NOTE — Progress Notes (Signed)
   08/04/2024  Patient ID: Zachary Hansen, male   DOB: Mar 30, 1941, 83 y.o.   MRN: [id number]  This patient is appearing on a report for being at risk of failing the adherence measure for hypertension (ACEi/ARB) medications this calendar year.   Medication: valsartan  40mg  daily  Last fill date: 5/20 for 90 day supply  Left voicemail for patient to return my call at their convenience.  Channing DELENA Mealing, PharmD, DPLA

## 2024-08-13 ENCOUNTER — Other Ambulatory Visit: Payer: Self-pay | Admitting: Family Medicine

## 2024-08-13 NOTE — Telephone Encounter (Unsigned)
 Copied from CRM [id number]. Topic: Clinical - Medication Refill >> Aug 13, 2024 12:28 PM Delon T wrote: Medication: FLUoxetine  (PROZAC ) 40 MG capsule- 90 day supply on all meds please amLODipine  (NORVASC ) 5 MG tablet meloxicam  (MOBIC ) 15 MG tablet valsartan  (DIOVAN ) 40 MG tablet  Has the patient contacted their pharmacy? Yes (Agent: If no, request that the patient contact the pharmacy for the refill. If patient does not wish to contact the pharmacy document the reason why and proceed with request.) (Agent: If yes, when and what did the pharmacy advise?)  This is the patient's preferred pharmacy:  [facility] [phone] [address], [place] - [address] [place] [address] Phone: [phone] Fax: [phone]  Is this the correct pharmacy for this prescription? Yes If no, delete pharmacy and type the correct one.   Has the prescription been filled recently? Yes  Is the patient out of the medication? No  Has the patient been seen for an appointment in the last year OR does the patient have an upcoming appointment? Yes  Can we respond through MyChart? No  Agent: Please be advised that Rx refills may take up to 3 business days. We ask that you follow-up with your pharmacy.

## 2024-08-15 MED ORDER — AMLODIPINE BESYLATE 5 MG PO TABS: 5.0000 mg | ORAL_TABLET | Freq: Every day | ORAL | 0 refills | Status: DC

## 2024-08-15 MED ORDER — MELOXICAM 15 MG PO TABS: 15.0000 mg | ORAL_TABLET | Freq: Every day | ORAL | 0 refills | Status: DC

## 2024-08-15 MED ORDER — FLUOXETINE HCL 40 MG PO CAPS: 40.0000 mg | ORAL_CAPSULE | Freq: Every day | ORAL | 0 refills | Status: DC

## 2024-08-15 MED ORDER — VALSARTAN 40 MG PO TABS
80.0000 mg | ORAL_TABLET | Freq: Every day | ORAL | 0 refills | Status: AC
Start: 2024-08-15 — End: ?

## 2024-08-15 NOTE — Telephone Encounter (Signed)
 Requested Prescriptions  Pending Prescriptions Disp Refills   meloxicam  (MOBIC ) 15 MG tablet 90 tablet 0    Sig: Take 1 tablet (15 mg total) by mouth daily.     Analgesics:  COX2 Inhibitors Failed - 08/15/2024 12:09 PM      Failed - Manual Review: Labs are only required if the patient has taken medication for more than 8 weeks.      Passed - HGB in normal range and within 360 days    Hemoglobin  Date Value Ref Range Status  03/06/2024 13.4 13.0 - 17.7 g/dL Final         Passed - Cr in normal range and within 360 days    Creatinine, Ser  Date Value Ref Range Status  03/06/2024 1.01 0.76 - 1.27 mg/dL Final         Passed - HCT in normal range and within 360 days    Hematocrit  Date Value Ref Range Status  03/06/2024 39.8 37.5 - 51.0 % Final         Passed - AST in normal range and within 360 days    AST  Date Value Ref Range Status  03/06/2024 18 0 - 40 IU/L Final         Passed - ALT in normal range and within 360 days    ALT  Date Value Ref Range Status  03/06/2024 16 0 - 44 IU/L Final         Passed - eGFR is 30 or above and within 360 days    GFR calc Af Amer  Date Value Ref Range Status  07/12/2020 69 >59 mL/min/1.73 Final    Comment:    **[facility] currently reports eGFR in compliance with the current**   recommendations of the [employer]. [facility] will   update reporting as new guidelines are published from the NKF-ASN   Task force.    GFR calc non Af Amer  Date Value Ref Range Status  07/12/2020 60 >59 mL/min/1.73 Final   eGFR  Date Value Ref Range Status  03/06/2024 75 >59 mL/min/1.73 Final         Passed - Patient is not pregnant      Passed - Valid encounter within last 12 months    Recent Outpatient Visits           5 months ago Depression, recurrent   [place] [facility] [place], Megan P, DO   6 months ago Osteoarthritis of spine with radiculopathy, lumbar region   [facility], Megan P, DO               amLODipine  (NORVASC ) 5 MG tablet 90 tablet 0    Sig: Take 1 tablet (5 mg total) by mouth daily.     Cardiovascular: Calcium Channel Blockers 2 Passed - 08/15/2024 12:09 PM      Passed - Last BP in normal range    BP Readings from Last 1 Encounters:  03/06/24 135/75         Passed - Last Heart Rate in normal range    Pulse Readings from Last 1 Encounters:  03/06/24 (!) 50         Passed - Valid encounter within last 6 months    Recent Outpatient Visits           5 months ago Depression, recurrent   [place] [facility] [place], [place], DO   6 months ago Osteoarthritis of  spine with radiculopathy, lumbar region   [facility], [place] P, DO               valsartan  (DIOVAN ) 40 MG tablet 180 tablet 0    Sig: Take 2 tablets (80 mg total) by mouth daily.     Cardiovascular:  Angiotensin Receptor Blockers Passed - 08/15/2024 12:09 PM      Passed - Cr in normal range and within 180 days    Creatinine, Ser  Date Value Ref Range Status  03/06/2024 1.01 0.76 - 1.27 mg/dL Final         Passed - K in normal range and within 180 days    Potassium  Date Value Ref Range Status  03/06/2024 4.2 3.5 - 5.2 mmol/L Final         Passed - Patient is not pregnant      Passed - Last BP in normal range    BP Readings from Last 1 Encounters:  03/06/24 135/75         Passed - Valid encounter within last 6 months    Recent Outpatient Visits           5 months ago Depression, recurrent   [facility] [place], Megan P, DO   6 months ago Osteoarthritis of spine with radiculopathy, lumbar region   [facility], Megan P, DO               FLUoxetine  (PROZAC ) 40 MG capsule 90 capsule 0    Sig: Take 1 capsule (40 mg total) by mouth daily.     Psychiatry:  Antidepressants - SSRI Passed - 08/15/2024 12:09 PM      Passed - Completed PHQ-2  or PHQ-9 in the last 360 days      Passed - Valid encounter within last 6 months    Recent Outpatient Visits           5 months ago Depression, recurrent   [facility] [place], Megan P, DO   6 months ago Osteoarthritis of spine with radiculopathy, lumbar region   [facility] [place], [place], DO

## 2024-09-17 ENCOUNTER — Ambulatory Visit: Admitting: Family Medicine

## 2024-09-17 ENCOUNTER — Encounter: Payer: Self-pay | Admitting: Family Medicine

## 2024-09-17 VITALS — BP 134/64 | HR 55 | Temp 97.5°F | Ht 68.0 in | Wt 176.4 lb

## 2024-09-17 DIAGNOSIS — Z8546 Personal history of malignant neoplasm of prostate: Secondary | ICD-10-CM | POA: Diagnosis not present

## 2024-09-17 DIAGNOSIS — F339 Major depressive disorder, recurrent, unspecified: Secondary | ICD-10-CM

## 2024-09-17 DIAGNOSIS — Z23 Encounter for immunization: Secondary | ICD-10-CM | POA: Diagnosis not present

## 2024-09-17 DIAGNOSIS — I129 Hypertensive chronic kidney disease with stage 1 through stage 4 chronic kidney disease, or unspecified chronic kidney disease: Secondary | ICD-10-CM

## 2024-09-17 DIAGNOSIS — Z0000 Encounter for general adult medical examination without abnormal findings: Secondary | ICD-10-CM | POA: Diagnosis not present

## 2024-09-17 DIAGNOSIS — E782 Mixed hyperlipidemia: Secondary | ICD-10-CM

## 2024-09-17 LAB — MICROALBUMIN, URINE WAIVED
Creatinine, Urine Waived: 300 mg/dL (ref 10–300)
Microalb, Ur Waived: 80 mg/L — ABNORMAL HIGH (ref 0–19)

## 2024-09-17 MED ORDER — AMLODIPINE BESYLATE 5 MG PO TABS: 5.0000 mg | ORAL_TABLET | Freq: Every day | ORAL | 1 refills | Status: AC

## 2024-09-17 MED ORDER — VALSARTAN 40 MG PO TABS: 80.0000 mg | ORAL_TABLET | Freq: Every day | ORAL | 1 refills | Status: AC

## 2024-09-17 MED ORDER — CYCLOBENZAPRINE HCL 10 MG PO TABS: ORAL_TABLET | ORAL | 1 refills | Status: AC

## 2024-09-17 MED ORDER — FLUOXETINE HCL 40 MG PO CAPS: 40.0000 mg | ORAL_CAPSULE | Freq: Every day | ORAL | 1 refills | Status: AC

## 2024-09-17 MED ORDER — MELOXICAM 15 MG PO TABS: 15.0000 mg | ORAL_TABLET | Freq: Every day | ORAL | 1 refills | Status: AC

## 2024-09-17 NOTE — Progress Notes (Signed)
 BP 134/64   Pulse (!) 55   Temp (!) 97.5 F (36.4 C) (Oral)   Ht 5' 8 (1.727 m)   Wt 176 lb 6.4 oz (80 kg)   SpO2 97%   BMI 26.82 kg/m    Subjective:    Patient ID: Zachary Hansen, male    DOB: 12-11-1940, 83 y.o.   MRN: [id number]  HPI: Zachary Hansen is a 83 y.o. male presenting on 09/17/2024 for comprehensive medical examination. Current medical complaints include:  BACK PAIN Duration: couple of days acting up Mechanism of injury: yardwork Location: bilateral low back Onset: sudden Severity: moderate Quality: aching and sore Frequency: intermittent Radiation: no Aggravating factors: lifting, twisting Alleviating factors: meloxicam  Status: stable Treatments attempted: rest, ice, heat, APAP, ibuprofen, and aleve   Relief with NSAIDs?: mild Nighttime pain:  no Paresthesias / decreased sensation:  no Bowel / bladder incontinence:  no Fevers:  no Dysuria / urinary frequency:  no  HYPERTENSION / HYPERLIPIDEMIA Satisfied with current treatment? yes Duration of hypertension: chronic BP monitoring frequency: not checking BP medication side effects: no Past BP meds: amlodipine , valstartan Duration of hyperlipidemia: years Cholesterol medication side effects: no Cholesterol supplements: none Past cholesterol medications: none Medication compliance: excellent compliance Aspirin: no Recent stressors: no Recurrent headaches: no Visual changes: no Palpitations: no Dyspnea: no Chest pain: no Lower extremity edema: no Dizzy/lightheaded: no  DEPRESSION Mood status: stable Satisfied with current treatment?: yes Symptom severity: mild  Duration of current treatment : chronic Side effects: no Medication compliance: excellent compliance Psychotherapy/counseling: no  Previous psychiatric medications: prozac  Depressed mood: no Anxious mood: no Anhedonia: no Significant weight loss or gain: no Insomnia: no  Fatigue: yes Feelings of worthlessness or guilt:  no Impaired concentration/indecisiveness: no Suicidal ideations: no Hopelessness: no Crying spells: no    09/17/2024    9:23 AM 01/22/2024   11:13 AM 10/23/2023   10:14 AM 09/11/2023    4:12 PM 05/31/2023    3:15 PM  Depression screen PHQ 2/9  Decreased Interest 0 1 0 0 0  Down, Depressed, Hopeless 1 1 0 0 0  PHQ - 2 Score 1 2 0 0 0  Altered sleeping 1 0 2 2 2   Tired, decreased energy 3 1 2 3 3   Change in appetite 1 0 1 0 1  Feeling bad or failure about yourself  0 0 0 0 0  Trouble concentrating 0 0 0 0 0  Moving slowly or fidgety/restless 0 0 0 0 0  Suicidal thoughts 0 0 0 0 0  PHQ-9 Score 6 3  5  5  6    Difficult doing work/chores Somewhat difficult Not difficult at all Not difficult at all Not difficult at all Not difficult at all     Data saved with a previous flowsheet row definition    He currently lives with: wife Interim Problems from his last visit: no  Depression Screen done today and results listed below:     09/17/2024    9:23 AM 01/22/2024   11:13 AM 10/23/2023   10:14 AM 09/11/2023    4:12 PM 05/31/2023    3:15 PM  Depression screen PHQ 2/9  Decreased Interest 0 1 0 0 0  Down, Depressed, Hopeless 1 1 0 0 0  PHQ - 2 Score 1 2 0 0 0  Altered sleeping 1 0 2 2 2   Tired, decreased energy 3 1 2 3 3   Change in appetite 1 0 1 0 1  Feeling bad or failure  about yourself  0 0 0 0 0  Trouble concentrating 0 0 0 0 0  Moving slowly or fidgety/restless 0 0 0 0 0  Suicidal thoughts 0 0 0 0 0  PHQ-9 Score 6 3  5  5  6    Difficult doing work/chores Somewhat difficult Not difficult at all Not difficult at all Not difficult at all Not difficult at all     Data saved with a previous flowsheet row definition    Past Medical History:  Past Medical History:  Diagnosis Date   ADHD (attention deficit hyperactivity disorder)    Depression    Prostate cancer (HCC)    Sleep apnea     Surgical History:  Past Surgical History:  Procedure Laterality Date   NASAL SEPTUM  SURGERY     prostate cancer surgery     THROAT SURGERY     Due to snoring    Medications:  Current Outpatient Medications on File Prior to Visit  Medication Sig   Cyanocobalamin (VITAMIN B 12 PO) Take 1,000 mcg by mouth daily.   Misc Natural Products (OSTEO BI-FLEX JOINT SHIELD PO) Take by mouth.   Multiple Vitamin (MULTI-VITAMINS) TABS Take by mouth.   VITAMIN E PO Take by mouth daily.   Current Facility-Administered Medications on File Prior to Visit  Medication   triamcinolone  acetonide (KENALOG -40) injection 40 mg    Allergies:  Allergies[1]  Social History:  Social History   Socioeconomic History   Marital status: Married    Spouse name: Cy   Number of children: 1   Years of education: Not on file   Highest education level: Not on file  Occupational History   Occupation: retired  Tobacco Use   Smoking status: Former    Current packs/day: 0.00    Average packs/day: 0.3 packs/day for 7.0 years (2.1 ttl pk-yrs)    Types: Cigarettes    Start date: 1963    Quit date: 10/02/1968    Years since quitting: 55.9    Passive exposure: Past   Smokeless tobacco: Never  Vaping Use   Vaping status: Never Used  Substance and Sexual Activity   Alcohol use: Yes    Alcohol/week: 3.0 - 4.0 standard drinks of alcohol    Types: 3 - 4 Standard drinks or equivalent per week    Comment: 1 mixed drink 3-4 times per week   Drug use: No   Sexual activity: Never  Other Topics Concern   Not on file  Social History Narrative   1 daughter lives in [place]    Social Drivers of Health   Tobacco Use: Medium Risk (09/17/2024)   Patient History    Smoking Tobacco Use: Former    Smokeless Tobacco Use: Never    Passive Exposure: Past  Physicist, Medical Strain: Low Risk  (06/16/2024)   Received from [facility]   Overall Financial Resource Strain (CARDIA)    Difficulty of Paying Living Expenses: Not very hard  Food Insecurity: No Food Insecurity (06/16/2024)    Received from [facility]   Epic    Within the past 12 months, you worried that your food would run out before you got the money to buy more.: Never true    Within the past 12 months, the food you bought just didn't last and you didn't have money to get more.: Never true  Transportation Needs: No Transportation Needs (06/16/2024)   Received from [facility]   [facility] - Transportation  In the past 12 months, has lack of transportation kept you from medical appointments or from getting medications?: No    Lack of Transportation (Non-Medical): No  Physical Activity: Inactive (01/22/2024)   Exercise Vital Sign    Days of Exercise per Week: 0 days    Minutes of Exercise per Session: 0 min  Stress: No Stress Concern Present (01/22/2024)   [employer] - Occupational Stress Questionnaire    Feeling of Stress : Not at all  Social Connections: Moderately Isolated (01/22/2024)   Social Connection and Isolation Panel    Frequency of Communication with Friends and Family: Twice a week    Frequency of Social Gatherings with Friends and Family: Never    Attends Religious Services: More than 4 times per year    Active Member of [employer] or Organizations: No    Attends Banker Meetings: Never    Marital Status: Married  Catering Manager Violence: Not At Risk (01/22/2024)   Humiliation, Afraid, Rape, and Kick questionnaire    Fear of Current or Ex-Partner: No    Emotionally Abused: No    Physically Abused: No    Sexually Abused: No  Depression (PHQ2-9): Medium Risk (09/17/2024)   Depression (PHQ2-9)    PHQ-2 Score: 6  Alcohol Screen: Low Risk (01/22/2024)   Alcohol Screen    Last Alcohol Screening Score (AUDIT): 3  Housing: Low Risk  (06/16/2024)   Received from [facility]   Epic    In the last 12 months, was there a time when you were not able to pay the mortgage or rent on time?: No    In the past 12  months, how many times have you moved where you were living?: 0    At any time in the past 12 months, were you homeless or living in a shelter (including now)?: No  Utilities: Not At Risk (06/16/2024)   Received from [facility]   Epic    In the past 12 months has the electric, gas, oil, or water company threatened to shut off services in your home?: No  Health Literacy: Adequate Health Literacy (01/22/2024)   B1300 Health Literacy    Frequency of need for help with medical instructions: Never   Tobacco Use History[2] Social History   Substance and Sexual Activity  Alcohol Use Yes   Alcohol/week: 3.0 - 4.0 standard drinks of alcohol   Types: 3 - 4 Standard drinks or equivalent per week   Comment: 1 mixed drink 3-4 times per week    Family History:  Family History  Problem Relation Age of Onset   Arthritis Mother    Heart disease Father    Cancer Father    Heart disease Sister    Cancer Sister    Sleep apnea Brother    Alzheimer's disease Maternal Grandfather    Heart disease Paternal Grandmother    Diverticulitis Sister    Sleep apnea Sister     Past medical history, surgical history, medications, allergies, family history and social history reviewed with patient today and changes made to appropriate areas of the chart.   Review of Systems  Constitutional: Negative.   HENT: Negative.    Eyes: Negative.   Respiratory:  Positive for cough. Negative for hemoptysis, sputum production, shortness of breath and wheezing.   Cardiovascular: Negative.   Gastrointestinal:  Positive for abdominal pain, constipation, diarrhea and heartburn. Negative for blood in stool, melena, nausea and vomiting.  Genitourinary: Negative.  Musculoskeletal:  Positive for back pain. Negative for falls, joint pain, myalgias and neck pain.  Skin: Negative.   Neurological: Negative.   Endo/Heme/Allergies: Negative.   Psychiatric/Behavioral: Negative.     All other ROS negative  except what is listed above and in the HPI.      Objective:    BP 134/64   Pulse (!) 55   Temp (!) 97.5 F (36.4 C) (Oral)   Ht 5' 8 (1.727 m)   Wt 176 lb 6.4 oz (80 kg)   SpO2 97%   BMI 26.82 kg/m   Wt Readings from Last 3 Encounters:  09/17/24 176 lb 6.4 oz (80 kg)  03/06/24 180 lb (81.6 kg)  01/22/24 180 lb 3.2 oz (81.7 kg)    Physical Exam Vitals and nursing note reviewed.  Constitutional:      General: He is not in acute distress.    Appearance: Normal appearance. He is obese. He is not ill-appearing, toxic-appearing or diaphoretic.  HENT:     Head: Normocephalic and atraumatic.     Right Ear: Tympanic membrane, ear canal and external ear normal. There is no impacted cerumen.     Left Ear: Tympanic membrane, ear canal and external ear normal. There is no impacted cerumen.     Nose: Nose normal. No congestion or rhinorrhea.     Mouth/Throat:     Mouth: Mucous membranes are moist.     Pharynx: Oropharynx is clear. No oropharyngeal exudate or posterior oropharyngeal erythema.  Eyes:     General: No scleral icterus.       Right eye: No discharge.        Left eye: No discharge.     Extraocular Movements: Extraocular movements intact.     Conjunctiva/sclera: Conjunctivae normal.     Pupils: Pupils are equal, round, and reactive to light.  Neck:     Vascular: No carotid bruit.  Cardiovascular:     Rate and Rhythm: Normal rate and regular rhythm.     Pulses: Normal pulses.     Heart sounds: No murmur heard.    No friction rub. No gallop.  Pulmonary:     Effort: Pulmonary effort is normal. No respiratory distress.     Breath sounds: Normal breath sounds. No stridor. No wheezing, rhonchi or rales.  Chest:     Chest wall: No tenderness.  Abdominal:     General: Abdomen is flat. Bowel sounds are normal. There is no distension.     Palpations: Abdomen is soft. There is no mass.     Tenderness: There is no abdominal tenderness. There is no right CVA tenderness, left CVA  tenderness, guarding or rebound.     Hernia: No hernia is present.  Genitourinary:    Comments: Genital exam deferred with shared decision making Musculoskeletal:        General: No swelling, tenderness, deformity or signs of injury.     Cervical back: Normal range of motion and neck supple. No rigidity. No muscular tenderness.     Right lower leg: No edema.     Left lower leg: No edema.  Lymphadenopathy:     Cervical: No cervical adenopathy.  Skin:    General: Skin is warm and dry.     Capillary Refill: Capillary refill takes less than 2 seconds.     Coloration: Skin is not jaundiced or pale.     Findings: No bruising, erythema, lesion or rash.  Neurological:     General: No focal deficit present.  Mental Status: He is alert and oriented to person, place, and time.     Cranial Nerves: No cranial nerve deficit.     Sensory: No sensory deficit.     Motor: No weakness.     Coordination: Coordination normal.     Gait: Gait normal.     Deep Tendon Reflexes: Reflexes normal.  Psychiatric:        Mood and Affect: Mood normal.        Behavior: Behavior normal.        Thought Content: Thought content normal.        Judgment: Judgment normal.     Results for orders placed or performed in visit on 09/17/24  Microalbumin, Urine Waived   Collection Time: 09/17/24  9:15 AM  Result Value Ref Range   Microalb, Ur Waived 80 (H) 0 - 19 mg/L   Creatinine, Urine Waived 300 10 - 300 mg/dL   Microalb/Creat Ratio 30-300 (H) <30 mg/g      Assessment & Plan:   Problem List Items Addressed This Visit       Genitourinary   Benign hypertensive renal disease   Under good control on current regimen. Continue current regimen. Continue to monitor. Call with any concerns. Refills given.       Relevant Orders   Microalbumin, Urine Waived (Completed)   Comprehensive metabolic panel with GFR   CBC with Differential/Platelet   TSH     Other   Depression, recurrent   Under good control on  current regimen. Continue current regimen. Continue to monitor. Call with any concerns. Refills given.        Relevant Medications   FLUoxetine  (PROZAC ) 40 MG capsule   Other Relevant Orders   Comprehensive metabolic panel with GFR   CBC with Differential/Platelet   TSH   History of malignant neoplasm of prostate   Rechecking labs today. Await results. Treat as needed.       Relevant Orders   Comprehensive metabolic panel with GFR   CBC with Differential/Platelet   PSA   Hyperlipidemia   Rechecking labs today. Await results. Treat as needed.       Relevant Medications   amLODipine  (NORVASC ) 5 MG tablet   valsartan  (DIOVAN ) 40 MG tablet   Other Relevant Orders   Comprehensive metabolic panel with GFR   CBC with Differential/Platelet   Lipid Panel w/o Chol/HDL Ratio   Other Visit Diagnoses       Routine general medical examination at a health care facility    -  Primary   Vaccines up to date. Screening labs checked today. Continue diet and exercise. Call with any concerns.     Flu vaccine need       Relevant Orders   Flu vaccine HIGH DOSE PF(Fluzone Trivalent)       LABORATORY TESTING:  Health maintenance labs ordered today as discussed above.   The natural history of prostate cancer and ongoing controversy regarding screening and potential treatment outcomes of prostate cancer has been discussed with the patient. The meaning of a false positive PSA and a false negative PSA has been discussed. He indicates understanding of the limitations of this screening test and wishes to proceed with screening PSA testing.   IMMUNIZATIONS:   - Tdap: Tetanus vaccination status reviewed: last tetanus booster within 10 years. - Influenza: Administered today - Pneumovax: Up to date - Prevnar: Up to date - COVID: Refused - HPV: Not applicable - Shingrix vaccine: Up to date  SCREENING: -  Colonoscopy: Not applicable  Discussed with patient purpose of the colonoscopy is to detect  colon cancer at curable precancerous or early stages    PATIENT COUNSELING:    Sexuality: Discussed sexually transmitted diseases, partner selection, use of condoms, avoidance of unintended pregnancy  and contraceptive alternatives.   Advised to avoid cigarette smoking.  I discussed with the patient that most people either abstain from alcohol or drink within safe limits (<=14/week and <=4 drinks/occasion for males, <=7/weeks and <= 3 drinks/occasion for females) and that the risk for alcohol disorders and other health effects rises proportionally with the number of drinks per week and how often a drinker exceeds daily limits.  Discussed cessation/primary prevention of drug use and availability of treatment for abuse.   Diet: Encouraged to adjust caloric intake to maintain  or achieve ideal body weight, to reduce intake of dietary saturated fat and total fat, to limit sodium intake by avoiding high sodium foods and not adding table salt, and to maintain adequate dietary potassium and calcium preferably from fresh fruits, vegetables, and low-fat dairy products.    stressed the importance of regular exercise  Injury prevention: Discussed safety belts, safety helmets, smoke detector, smoking near bedding or upholstery.   Dental health: Discussed importance of regular tooth brushing, flossing, and dental visits.   Follow up plan: NEXT PREVENTATIVE PHYSICAL DUE IN 1 YEAR. Return in about 6 months (around 03/18/2025).     [1]  Allergies Allergen Reactions   Lisinopril  Cough  [2]  Social History Tobacco Use  Smoking Status Former   Current packs/day: 0.00   Average packs/day: 0.3 packs/day for 7.0 years (2.1 ttl pk-yrs)   Types: Cigarettes   Start date: 63   Quit date: 10/02/1968   Years since quitting: 55.9   Passive exposure: Past  Smokeless Tobacco Never

## 2024-09-17 NOTE — Assessment & Plan Note (Signed)
 Under good control on current regimen. Continue current regimen. Continue to monitor. Call with any concerns. Refills given.

## 2024-09-17 NOTE — Assessment & Plan Note (Signed)
 Rechecking labs today. Await results. Treat as needed.

## 2024-09-18 ENCOUNTER — Ambulatory Visit: Payer: Self-pay | Admitting: Family Medicine

## 2024-09-18 DIAGNOSIS — R972 Elevated prostate specific antigen [PSA]: Secondary | ICD-10-CM

## 2024-09-18 LAB — CBC WITH DIFFERENTIAL/PLATELET
Basophils Absolute: 0 x10E3/uL (ref 0.0–0.2)
Basos: 0 %
EOS (ABSOLUTE): 0.2 x10E3/uL (ref 0.0–0.4)
Eos: 2 %
Hematocrit: 41.5 % (ref 37.5–51.0)
Hemoglobin: 14 g/dL (ref 13.0–17.7)
Immature Grans (Abs): 0 x10E3/uL (ref 0.0–0.1)
Immature Granulocytes: 0 %
Lymphocytes Absolute: 1.3 x10E3/uL (ref 0.7–3.1)
Lymphs: 14 %
MCH: 33.3 pg — ABNORMAL HIGH (ref 26.6–33.0)
MCHC: 33.7 g/dL (ref 31.5–35.7)
MCV: 99 fL — ABNORMAL HIGH (ref 79–97)
Monocytes Absolute: 0.8 x10E3/uL (ref 0.1–0.9)
Monocytes: 8 %
Neutrophils Absolute: 7.1 x10E3/uL — ABNORMAL HIGH (ref 1.4–7.0)
Neutrophils: 76 %
Platelets: 206 x10E3/uL (ref 150–450)
RBC: 4.2 x10E6/uL (ref 4.14–5.80)
RDW: 13 % (ref 11.6–15.4)
WBC: 9.4 x10E3/uL (ref 3.4–10.8)

## 2024-09-18 LAB — COMPREHENSIVE METABOLIC PANEL WITH GFR
ALT: 13 IU/L (ref 0–44)
AST: 16 IU/L (ref 0–40)
Albumin: 4.4 g/dL (ref 3.8–4.8)
Alkaline Phosphatase: 110 IU/L (ref 47–123)
BUN/Creatinine Ratio: 19 (ref 10–24)
BUN: 23 mg/dL (ref 8–27)
Bilirubin Total: 0.6 mg/dL (ref 0.0–1.2)
CO2: 21 mmol/L (ref 20–29)
Calcium: 9 mg/dL (ref 8.6–10.2)
Chloride: 106 mmol/L (ref 96–106)
Creatinine, Ser: 1.2 mg/dL (ref 0.76–1.27)
Globulin, Total: 2.6 g/dL (ref 1.5–4.5)
Glucose: 139 mg/dL — ABNORMAL HIGH (ref 70–99)
Potassium: 4.1 mmol/L (ref 3.5–5.2)
Sodium: 142 mmol/L (ref 134–144)
Total Protein: 7 g/dL (ref 6.0–8.5)
eGFR: 61 mL/min/1.73 (ref >59)

## 2024-09-18 LAB — LIPID PANEL W/O CHOL/HDL RATIO
Cholesterol, Total: 219 mg/dL — ABNORMAL HIGH (ref 100–199)
HDL: 52 mg/dL (ref >39)
LDL Chol Calc (NIH): 143 mg/dL — ABNORMAL HIGH (ref 0–99)
Triglycerides: 135 mg/dL (ref 0–149)
VLDL Cholesterol Cal: 24 mg/dL (ref 5–40)

## 2024-09-18 LAB — TSH: TSH: 2.95 u[IU]/mL (ref 0.450–4.500)

## 2024-09-18 LAB — PSA: Prostate Specific Ag, Serum: 3.8 ng/mL (ref 0.0–4.0)

## 2024-09-19 ENCOUNTER — Ambulatory Visit

## 2024-09-19 DIAGNOSIS — Z23 Encounter for immunization: Secondary | ICD-10-CM | POA: Diagnosis not present

## 2024-09-19 NOTE — Progress Notes (Signed)
 Patient is in office today for a nurse visit for Immunization. Patient Injection was given in the  Left deltoid. Patient tolerated injection well.

## 2024-09-22 NOTE — Telephone Encounter (Unsigned)
 Copied from CRM [id number]. Topic: Clinical - Lab/Test Results >> Sep 22, 2024  2:18 PM Mia F wrote: Reason for CRM: Pt called back and labs were relayed word from word. Recheck scheduled

## 2024-10-09 ENCOUNTER — Ambulatory Visit: Payer: Self-pay

## 2024-10-09 NOTE — Telephone Encounter (Signed)
 Attempted to contact patient x 1 to discuss symptoms; LVM to return call, Will attempt to contact patient at a later time to further discuss concerns.           Copied from CRM [id number]. Topic: Clinical - Red Word Triage >> Oct 09, 2024  2:11 PM Amber H wrote: [facility] that prompted transfer to Nurse Triage: Patient c/o pain in back that radiates to right leg and hip. Right knee is not numb. Last seen 12/17 for the issues and was pxd pain meds. >> Oct 09, 2024  2:17 PM Amber H wrote: **Right knee is numb- I was told that NT will give patient a call.   Dewayne- [phone]

## 2024-10-09 NOTE — Telephone Encounter (Signed)
 FYI Only or Action Required?: FYI only for provider: appointment scheduled on 01.09.26.  Patient was last seen in primary care on 09/17/2024 by Vicci Duwaine SQUIBB, DO.  Called Nurse Triage reporting Back Pain.  Symptoms began several weeks ago.  Interventions attempted: Prescription medications: cyclobenzaprine .  Symptoms are: gradually worsening.  Triage Disposition: See Physician Within 24 Hours  Patient/caregiver understands and will follow disposition?: Yes  Copied from CRM [id number]. Topic: Clinical - Red Word Triage >> Oct 09, 2024  2:11 PM Amber H wrote: [facility] that prompted transfer to Nurse Triage: Patient c/o pain in back that radiates to right leg and hip. Right knee is not numb. Last seen 12/17 for the issues and was pxd pain meds. >> Oct 09, 2024  2:17 PM Amber H wrote: **Right knee is numb- I was told that NT will give patient a call.    [phone]  Reason for Disposition  Numbness in a leg or foot (i.e., loss of sensation)  Answer Assessment - Initial Assessment Questions 1. ONSET: When did the pain begin? (e.g., minutes, hours, days)     X 2 weeks  2. LOCATION: Where does it hurt? (upper, mid or lower back)     Lower back  3. SEVERITY: How bad is the pain?  (e.g., Scale 1-10; mild, moderate, or severe)      X 3/10  4. PATTERN: Is the pain constant? (e.g., yes, no; constant, intermittent)       constant  5. RADIATION: Does the pain shoot into your legs or somewhere else?     R Hip and R knee radiation  6. CAUSE:  What do you think is causing the back pain?       Arthritis  7. BACK OVERUSE:  Any recent lifting of heavy objects, strenuous work or exercise?      Overuse, working bending over  8. MEDICINES: What have you taken so far for the pain? (e.g., nothing, acetaminophen, NSAIDS)      Cyclobenzaprine   9. NEUROLOGIC SYMPTOMS:     Pt denies weakness, numbness, or problems with bowel/bladder control       10. OTHER SYMPTOMS:  Do you have any other symptoms? (e.g., fever, abdomen pain, burning with urination, blood in urine)        Lightheadedness and numbness of right knee. Pt can still walk.  Pt reports lower back pain, right knee pain, right hip pain Pt is taking Cyclobenzaprine  Pt scheduled for a visit on  01.09.26  for further evaluation. Pt agrees with plan of care, will call back for any worsening symptoms  Protocols used: Back Pain-A-AH

## 2024-10-10 ENCOUNTER — Ambulatory Visit (INDEPENDENT_AMBULATORY_CARE_PROVIDER_SITE_OTHER): Admitting: Nurse Practitioner

## 2024-10-10 ENCOUNTER — Encounter: Payer: Self-pay | Admitting: Nurse Practitioner

## 2024-10-10 VITALS — BP 142/89 | HR 71 | Temp 97.5°F | Ht 67.99 in | Wt 175.8 lb

## 2024-10-10 DIAGNOSIS — M5441 Lumbago with sciatica, right side: Secondary | ICD-10-CM

## 2024-10-10 MED ORDER — METHYLPREDNISOLONE 4 MG PO TBPK: ORAL_TABLET | ORAL | 0 refills | Status: AC

## 2024-10-10 NOTE — Progress Notes (Signed)
 "  BP (!) 142/89   Pulse 71   Temp (!) 97.5 F (36.4 C) (Oral)   Ht 5' 7.99 (1.727 m)   Wt 175 lb 12.8 oz (79.7 kg)   SpO2 96%   BMI 26.74 kg/m    Subjective:    Patient ID: Zachary Hansen, Zachary Hansen    DOB: [birth year], 84 y.o.   MRN: [id number]  HPI: Zachary Hansen is a 84 y.o. Zachary Hansen  Chief Complaint  Patient presents with   Back Pain    Patient stated the pain has moved from his back, to his hip, and down his leg. Below his knee is numb. This has been ongoing for two weeks. 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 He was working a couple of weeks ago and bending over a lot and aggravated it.  States it moved into his hip.  Started as a sharp pain then resolved in the hip but it radiating down his leg and feels like his knee is weak (R). Duration: a couple weeks Mechanism of injury: bending Location:low back pain Onset: gradual Severity: 3/10 Quality: aching Frequency: constant Radiation: buttocks and R leg above the knee Aggravating factors: movement Alleviating factors: cyclobenzaprine  and meloxicam  Status: worse Treatments attempted: cyclobenzaprine  and meloxicam  Relief with NSAIDs?: mild Nighttime pain:  yes Paresthesias / decreased sensation:  yes Bowel / bladder incontinence:  no Fevers:  no Dysuria / urinary frequency:  no  Relevant past medical, surgical, family and social history reviewed and updated as indicated. Interim medical history since our last visit reviewed. Allergies and medications reviewed and updated.  Review of Systems  Musculoskeletal:  Positive for back pain.    Per HPI unless specifically indicated above     Objective:    BP (!) 142/89   Pulse 71   Temp (!) 97.5 F (36.4 C) (Oral)   Ht 5' 7.99 (1.727 m)   Wt 175 lb 12.8 oz (79.7 kg)   SpO2 96%   BMI 26.74 kg/m   Wt Readings from Last 3 Encounters:  10/10/24 175 lb 12.8 oz (79.7 kg)  09/17/24 176 lb 6.4 oz (80 kg)  03/06/24 180 lb  (81.6 kg)    Physical Exam Vitals and nursing note reviewed.  Constitutional:      General: He is not in acute distress.    Appearance: Normal appearance. He is not ill-appearing, toxic-appearing or diaphoretic.  HENT:     Head: Normocephalic.     Right Ear: External ear normal.     Left Ear: External ear normal.     Nose: Nose normal. No congestion or rhinorrhea.     Mouth/Throat:     Mouth: Mucous membranes are moist.  Eyes:     General:        Right eye: No discharge.        Left eye: No discharge.     Extraocular Movements: Extraocular movements intact.     Conjunctiva/sclera: Conjunctivae normal.     Pupils: Pupils are equal, round, and reactive to light.  Cardiovascular:     Rate and Rhythm: Normal rate and regular rhythm.     Heart sounds: No murmur heard. Pulmonary:     Effort: Pulmonary effort is normal. No respiratory distress.     Breath sounds: Normal breath sounds. No wheezing, rhonchi or rales.  Abdominal:     General: Abdomen is flat. Bowel sounds are normal.  Musculoskeletal:     Cervical back: Normal range  of motion and neck supple.     Lumbar back: No swelling, edema, deformity, signs of trauma, lacerations, spasms, tenderness or bony tenderness. Decreased range of motion. Negative right straight leg raise test and negative left straight leg raise test. No scoliosis.  Skin:    General: Skin is warm and dry.     Capillary Refill: Capillary refill takes less than 2 seconds.  Neurological:     General: No focal deficit present.     Mental Status: He is alert and oriented to person, place, and time.  Psychiatric:        Mood and Affect: Mood normal.        Behavior: Behavior normal.        Thought Content: Thought content normal.        Judgment: Judgment normal.     Results for orders placed or performed in visit on 09/17/24  Microalbumin, Urine Waived   Collection Time: 09/17/24  9:15 AM  Result Value Ref Range   Microalb, Ur Waived 80 (H) 0 - 19 mg/L    Creatinine, Urine Waived 300 10 - 300 mg/dL   Microalb/Creat Ratio 30-300 (H) <30 mg/g  Comprehensive metabolic panel with GFR   Collection Time: 09/17/24  9:16 AM  Result Value Ref Range   Glucose 139 (H) 70 - 99 mg/dL   BUN 23 8 - 27 mg/dL   Creatinine, Ser 8.79 0.76 - 1.27 mg/dL   eGFR 61 >40 fO/fpw/8.26   BUN/Creatinine Ratio 19 10 - 24   Sodium 142 134 - 144 mmol/L   Potassium 4.1 3.5 - 5.2 mmol/L   Chloride 106 96 - 106 mmol/L   CO2 21 20 - 29 mmol/L   Calcium 9.0 8.6 - 10.2 mg/dL   Total Protein 7.0 6.0 - 8.5 g/dL   Albumin 4.4 3.8 - 4.8 g/dL   Globulin, Total 2.6 1.5 - 4.5 g/dL   Bilirubin Total 0.6 0.0 - 1.2 mg/dL   Alkaline Phosphatase 110 47 - 123 IU/L   AST 16 0 - 40 IU/L   ALT 13 0 - 44 IU/L  CBC with Differential/Platelet   Collection Time: 09/17/24  9:16 AM  Result Value Ref Range   WBC 9.4 3.4 - 10.8 x10E3/uL   RBC 4.20 4.14 - 5.80 x10E6/uL   Hemoglobin 14.0 13.0 - 17.7 g/dL   Hematocrit 58.4 62.4 - 51.0 %   MCV 99 (H) 79 - 97 fL   MCH 33.3 (H) 26.6 - 33.0 pg   MCHC 33.7 31.5 - 35.7 g/dL   RDW 86.9 88.3 - 84.5 %   Platelets 206 150 - 450 x10E3/uL   Neutrophils 76 Not Estab. %   Lymphs 14 Not Estab. %   Monocytes 8 Not Estab. %   Eos 2 Not Estab. %   Basos 0 Not Estab. %   Neutrophils Absolute 7.1 (H) 1.4 - 7.0 x10E3/uL   Lymphocytes Absolute 1.3 0.7 - 3.1 x10E3/uL   Monocytes Absolute 0.8 0.1 - 0.9 x10E3/uL   EOS (ABSOLUTE) 0.2 0.0 - 0.4 x10E3/uL   Basophils Absolute 0.0 0.0 - 0.2 x10E3/uL   Immature Granulocytes 0 Not Estab. %   Immature Grans (Abs) 0.0 0.0 - 0.1 x10E3/uL  Lipid Panel w/o Chol/HDL Ratio   Collection Time: 09/17/24  9:16 AM  Result Value Ref Range   Cholesterol, Total 219 (H) 100 - 199 mg/dL   Triglycerides 864 0 - 149 mg/dL   HDL 52 >60 mg/dL   VLDL Cholesterol Cal 24 5 -  40 mg/dL   LDL Chol Calc (NIH) 856 (H) 0 - 99 mg/dL  PSA   Collection Time: 09/17/24  9:16 AM  Result Value Ref Range   Prostate Specific Ag, Serum 3.8  0.0 - 4.0 ng/mL  TSH   Collection Time: 09/17/24  9:16 AM  Result Value Ref Range   TSH 2.950 0.450 - 4.500 uIU/mL      Assessment & Plan:   Problem List Items Addressed This Visit       Nervous and Auditory   Right-sided low back pain with right-sided sciatica - Primary   Ongoing x 2 weeks.  Currently on Meloxicam  and Flexeril .  Will start medrol  dose pak.  Does have history of prostate cancer.  PSA elevated at last visit.  Will recheck today.  Patient made aware of concern.  Will follow up once results are back.      Relevant Medications   methylPREDNISolone  (MEDROL  DOSEPAK) 4 MG TBPK tablet   Other Relevant Orders   CBC w/Diff   PSA     Follow up plan: Return if symptoms worsen or fail to improve.      "

## 2024-10-10 NOTE — Assessment & Plan Note (Signed)
 Ongoing x 2 weeks.  Currently on Meloxicam  and Flexeril .  Will start medrol  dose pak.  Does have history of prostate cancer.  PSA elevated at last visit.  Will recheck today.  Patient made aware of concern.  Will follow up once results are back.

## 2024-10-11 LAB — CBC WITH DIFFERENTIAL/PLATELET
Basophils Absolute: 0.1 x10E3/uL (ref 0.0–0.2)
Basos: 1 %
EOS (ABSOLUTE): 0.1 x10E3/uL (ref 0.0–0.4)
Eos: 3 %
Hematocrit: 44.1 % (ref 37.5–51.0)
Hemoglobin: 15 g/dL (ref 13.0–17.7)
Immature Grans (Abs): 0 x10E3/uL (ref 0.0–0.1)
Immature Granulocytes: 0 %
Lymphocytes Absolute: 1.9 x10E3/uL (ref 0.7–3.1)
Lymphs: 39 %
MCH: 32.8 pg (ref 26.6–33.0)
MCHC: 34 g/dL (ref 31.5–35.7)
MCV: 97 fL (ref 79–97)
Monocytes Absolute: 0.4 x10E3/uL (ref 0.1–0.9)
Monocytes: 9 %
Neutrophils Absolute: 2.3 x10E3/uL (ref 1.4–7.0)
Neutrophils: 48 %
Platelets: 248 x10E3/uL (ref 150–450)
RBC: 4.57 x10E6/uL (ref 4.14–5.80)
RDW: 13 % (ref 11.6–15.4)
WBC: 4.8 x10E3/uL (ref 3.4–10.8)

## 2024-10-11 LAB — PSA: Prostate Specific Ag, Serum: 3.3 ng/mL (ref 0.0–4.0)

## 2024-10-13 ENCOUNTER — Ambulatory Visit: Payer: Self-pay | Admitting: Nurse Practitioner

## 2024-10-13 DIAGNOSIS — R972 Elevated prostate specific antigen [PSA]: Secondary | ICD-10-CM

## 2024-10-14 NOTE — Telephone Encounter (Signed)
 I called the patient and left a detailed VM on his cell phone (per his flags under his file). If the patient has any additional questions or concerns it's okay for E2C2 to speak with him.

## 2024-10-20 ENCOUNTER — Other Ambulatory Visit

## 2024-10-20 DIAGNOSIS — R972 Elevated prostate specific antigen [PSA]: Secondary | ICD-10-CM

## 2024-10-21 LAB — PSA: Prostate Specific Ag, Serum: 5.1 ng/mL — ABNORMAL HIGH (ref 0.0–4.0)

## 2024-10-25 ENCOUNTER — Ambulatory Visit: Payer: Self-pay | Admitting: Family Medicine

## 2024-10-25 DIAGNOSIS — R972 Elevated prostate specific antigen [PSA]: Secondary | ICD-10-CM

## 2024-10-27 DIAGNOSIS — N400 Enlarged prostate without lower urinary tract symptoms: Secondary | ICD-10-CM | POA: Insufficient documentation

## 2024-10-27 NOTE — Assessment & Plan Note (Signed)
 S/p reported TURP (unclear timeline, presumably prior to RALP in 2009) Stable LUTS, no new changes, minimally bothersome

## 2024-10-27 NOTE — Progress Notes (Unsigned)
 "  10/29/24 11:19 AM   Zachary Hansen 1941/08/23 [id number]   HPI: 84 y.o. male here for initial evaluation of   First time meeting today, pleasant patient Worsening low back pain with right-sided sciatica-acute on chronic No new bone pain otherwise 5 pound weight loss over the last year General Appetite has been decreasing Denies acute change in LUTS, urinary habits are stable and minimally bothersome  Hx of prostate Ca -no records, history per patient recollection  - s/p RALP in 2009 (in [place])   - denies adjuvant XRT  - lost to follow up shortly after surgery  - PSA 5.1 (10/20/14) - steady uptrend from ~0.5 in 2022  Hx of BPH  - s/p prior TURP (unclear history, no records)     PMH: Past Medical History:  Diagnosis Date   ADHD (attention deficit hyperactivity disorder)    Depression    Prostate cancer (HCC)    Sleep apnea     Surgical History: Past Surgical History:  Procedure Laterality Date   NASAL SEPTUM SURGERY     prostate cancer surgery     THROAT SURGERY     Due to snoring    Family History: Family History  Problem Relation Age of Onset   Arthritis Mother    Heart disease Father    Cancer Father    Heart disease Sister    Cancer Sister    Sleep apnea Brother    Alzheimer's disease Maternal Grandfather    Heart disease Paternal Grandmother    Diverticulitis Sister    Sleep apnea Sister     Social History:  reports that he quit smoking about 56 years ago. His smoking use included cigarettes. He started smoking about 63 years ago. He has a 2.1 pack-year smoking history. He has been exposed to tobacco smoke. He has never used smokeless tobacco. He reports current alcohol use of about 3.0 - 4.0 standard drinks of alcohol per week. He reports that he does not use drugs.      Physical Exam: BP 113/72   Pulse 94   Ht 5' 8 (1.727 m)   Wt 175 lb (79.4 kg)   BMI 26.61 kg/m    Constitutional:  Alert and oriented, No acute  distress. Cardiovascular: No clubbing, cyanosis, or edema. Respiratory: Normal respiratory effort, no increased work of breathing. GI: Nondistended GU: defrred Skin: No rashes, bruises or suspicious lesions. Neurologic: Grossly intact, no focal deficits, moving all 4 extremities. Psychiatric: Normal mood and affect.  Laboratory Data: Component Ref Range & Units (hover) 7 d ago (10/20/24) 2 wk ago (10/10/24) 1 mo ago (09/17/24) 1 yr ago (11/13/22) 2 yr ago (05/11/22) 3 yr ago (07/05/21) 3 yr ago (01/25/21)  Prostate Specific Ag, Serum 5.1 High  3.3 CM 3.8 CM 0.8 CM 0.9 CM 0.5 CM 0.6 CM     Pertinent Imaging: N/A    Assessment & Plan:    Benign prostatic hyperplasia, unspecified whether lower urinary tract symptoms present Assessment & Plan: S/p reported TURP (unclear timeline, presumably prior to RALP in 2009) Stable LUTS, no new changes, minimally bothersome   Prostate cancer [facility]) Assessment & Plan: Hx of prostate Ca  - s/p RALP in 2009 (in [place])   - unknown pathology  - denies adjuvant XRT  - lost to follow up shortly after surgery  - PSA 5.1 (10/20/14) - steady uptrend from ~0.5 in 2022  Late biochemical recurrence, with clear PSA uptrend since 2022.  Doubling time 1-2 years.  Reviewed his  clinical history and recent PSA data. Acute on chronic low back pain w/ R sciatica Generally, in reasonable health.  Would recommend complete restaging with PSMA PET, determine local versus regional versus distant metastasis.  Explained to him today the very real possibility of microscopic metastatic disease.  Briefly I touched on management strategies following staging, including watchful waiting, ADT, local or local regional radiation therapy.   - PSMA PET scan for restaging - Referral to radiation oncology and medical oncology - he has current CBC/BMP w/ normal ALP -- will add total testosterone  today - Follow up with me in after PSMA/PET to ensure care plan is progressing.  Rad/Med oncology will ultimately direct further treatment  Orders: -     Ambulatory referral to Radiation Oncology -     Ambulatory referral to Hematology / Oncology -     NM PET (PSMA) SKULL TO MID THIGH; Future -     Testosterone ; Future  Benign hypertensive renal disease Assessment & Plan: HTN > 140 today. Reviewed his blood pressure across two separate readings in clinic.  Recommended that patient secure close follow up with their PCP for further management        Penne Skye, MD 10/29/2024  [facility] [address], [address] [place], [place] [address] [phone] "

## 2024-10-29 ENCOUNTER — Ambulatory Visit: Admitting: Urology

## 2024-10-29 VITALS — BP 113/72 | HR 94 | Ht 68.0 in | Wt 175.0 lb

## 2024-10-29 DIAGNOSIS — I129 Hypertensive chronic kidney disease with stage 1 through stage 4 chronic kidney disease, or unspecified chronic kidney disease: Secondary | ICD-10-CM | POA: Diagnosis not present

## 2024-10-29 DIAGNOSIS — N400 Enlarged prostate without lower urinary tract symptoms: Secondary | ICD-10-CM

## 2024-10-29 DIAGNOSIS — C61 Malignant neoplasm of prostate: Secondary | ICD-10-CM | POA: Diagnosis not present

## 2024-10-29 NOTE — Patient Instructions (Signed)
 Please call [phone] to schedule your imaging prior to your appointment. Please allow time for your imaging results as this can take up 2-3 weeks to review. A follow up appointment as already been scheduled for the doctor to review results with you.

## 2024-10-29 NOTE — Assessment & Plan Note (Addendum)
 Hx of prostate Ca  - s/p RALP in 2009 (in [place])   - unknown pathology  - denies adjuvant XRT  - lost to follow up shortly after surgery  - PSA 5.1 (10/20/14) - steady uptrend from ~0.5 in 2022  Late biochemical recurrence, with clear PSA uptrend since 2022.  Doubling time 1-2 years.  Reviewed his clinical history and recent PSA data. Acute on chronic low back pain w/ R sciatica Generally, in reasonable health.  Would recommend complete restaging with PSMA PET, determine local versus regional versus distant metastasis.  Explained to him today the very real possibility of microscopic metastatic disease.  Briefly I touched on management strategies following staging, including watchful waiting, ADT, local or local regional radiation therapy.   - PSMA PET scan for restaging - Referral to radiation oncology and medical oncology - he has current CBC/BMP w/ normal ALP -- will add total testosterone  today - Follow up with me in after PSMA/PET to ensure care plan is progressing. Rad/Med oncology will ultimately direct further treatment

## 2024-10-29 NOTE — Assessment & Plan Note (Signed)
 HTN > 140 today. Reviewed his blood pressure across two separate readings in clinic.  Recommended that patient secure close follow up with their PCP for further management

## 2024-10-30 LAB — TESTOSTERONE: Testosterone: 270 ng/dL (ref 264–916)

## 2024-11-05 ENCOUNTER — Telehealth: Payer: Self-pay | Admitting: Oncology

## 2024-11-05 NOTE — Telephone Encounter (Signed)
 Called patient to reschedule appointment with Dr. Jacobo to after the PET scan. Left voicemail for patient to call back to reschedule- 2/3 [facility]

## 2024-11-11 ENCOUNTER — Ambulatory Visit

## 2024-11-11 ENCOUNTER — Inpatient Hospital Stay: Admitting: Oncology

## 2024-11-11 ENCOUNTER — Inpatient Hospital Stay

## 2024-11-12 ENCOUNTER — Inpatient Hospital Stay

## 2024-11-12 ENCOUNTER — Inpatient Hospital Stay: Admitting: Oncology

## 2024-11-17 ENCOUNTER — Ambulatory Visit: Admitting: Radiation Oncology

## 2024-12-03 ENCOUNTER — Ambulatory Visit: Admitting: Urology

## 2024-12-17 ENCOUNTER — Inpatient Hospital Stay

## 2025-01-27 ENCOUNTER — Ambulatory Visit

## 2025-03-18 ENCOUNTER — Ambulatory Visit: Admitting: Family Medicine
